# Patient Record
Sex: Female | Born: 1940
Health system: Southern US, Community
[De-identification: ages and names within clinical notes are randomized; demographics above are authoritative.]

## PROBLEM LIST (undated history)

## (undated) DIAGNOSIS — R112 Nausea with vomiting, unspecified: Secondary | ICD-10-CM

## (undated) DIAGNOSIS — T4145XA Adverse effect of unspecified anesthetic, initial encounter: Secondary | ICD-10-CM

## (undated) DIAGNOSIS — Z9889 Other specified postprocedural states: Secondary | ICD-10-CM

## (undated) DIAGNOSIS — M1990 Unspecified osteoarthritis, unspecified site: Secondary | ICD-10-CM

## (undated) DIAGNOSIS — G43909 Migraine, unspecified, not intractable, without status migrainosus: Secondary | ICD-10-CM

## (undated) DIAGNOSIS — T8859XA Other complications of anesthesia, initial encounter: Secondary | ICD-10-CM

## (undated) DIAGNOSIS — IMO0001 Reserved for inherently not codable concepts without codable children: Secondary | ICD-10-CM

## (undated) DIAGNOSIS — I499 Cardiac arrhythmia, unspecified: Secondary | ICD-10-CM

## (undated) DIAGNOSIS — I10 Essential (primary) hypertension: Secondary | ICD-10-CM

## (undated) HISTORY — PX: CHOLECYSTECTOMY: SHX55

## (undated) HISTORY — PX: COLONOSCOPY: SHX174

## (undated) HISTORY — PX: ABDOMINAL HYSTERECTOMY: SHX81

## (undated) HISTORY — PX: ROTATOR CUFF REPAIR: SHX139

## (undated) HISTORY — PX: CARDIAC SURGERY: SHX584

## (undated) HISTORY — DX: Essential (primary) hypertension: I10

## (undated) HISTORY — PX: ANTERIOR AND POSTERIOR REPAIR: SHX1172

## (undated) HISTORY — PX: ESOPHAGEAL DILATION: SHX303

## (undated) HISTORY — PX: HERNIA REPAIR: SHX51

---

## 1998-02-01 ENCOUNTER — Encounter: Payer: Self-pay | Admitting: Emergency Medicine

## 1998-02-01 ENCOUNTER — Emergency Department (HOSPITAL_COMMUNITY): Admission: EM | Admit: 1998-02-01 | Discharge: 1998-02-01 | Payer: Self-pay

## 1998-02-06 ENCOUNTER — Ambulatory Visit (HOSPITAL_COMMUNITY): Admission: RE | Admit: 1998-02-06 | Discharge: 1998-02-06 | Payer: Self-pay | Admitting: Specialist

## 1998-02-06 ENCOUNTER — Encounter: Payer: Self-pay | Admitting: Specialist

## 1998-11-26 ENCOUNTER — Other Ambulatory Visit: Admission: RE | Admit: 1998-11-26 | Discharge: 1998-11-26 | Payer: Self-pay | Admitting: Endocrinology

## 1999-05-25 ENCOUNTER — Encounter: Payer: Self-pay | Admitting: Endocrinology

## 1999-05-25 ENCOUNTER — Ambulatory Visit (HOSPITAL_COMMUNITY): Admission: RE | Admit: 1999-05-25 | Discharge: 1999-05-25 | Payer: Self-pay | Admitting: Endocrinology

## 2000-10-26 ENCOUNTER — Ambulatory Visit (HOSPITAL_COMMUNITY): Admission: RE | Admit: 2000-10-26 | Discharge: 2000-10-26 | Payer: Self-pay | Admitting: Endocrinology

## 2000-10-26 ENCOUNTER — Encounter: Payer: Self-pay | Admitting: Endocrinology

## 2004-01-14 ENCOUNTER — Ambulatory Visit: Payer: Self-pay | Admitting: Internal Medicine

## 2004-08-21 ENCOUNTER — Emergency Department: Payer: Self-pay | Admitting: Emergency Medicine

## 2004-08-21 ENCOUNTER — Other Ambulatory Visit: Payer: Self-pay

## 2004-09-01 ENCOUNTER — Observation Stay (HOSPITAL_COMMUNITY): Admission: RE | Admit: 2004-09-01 | Discharge: 2004-09-02 | Payer: Self-pay | Admitting: Obstetrics and Gynecology

## 2005-03-09 ENCOUNTER — Encounter: Admission: RE | Admit: 2005-03-09 | Discharge: 2005-03-09 | Payer: Self-pay | Admitting: Internal Medicine

## 2005-03-29 ENCOUNTER — Ambulatory Visit (HOSPITAL_COMMUNITY): Admission: RE | Admit: 2005-03-29 | Discharge: 2005-03-29 | Payer: Self-pay | Admitting: Surgery

## 2005-03-30 ENCOUNTER — Encounter (INDEPENDENT_AMBULATORY_CARE_PROVIDER_SITE_OTHER): Payer: Self-pay | Admitting: *Deleted

## 2005-03-30 ENCOUNTER — Ambulatory Visit (HOSPITAL_COMMUNITY): Admission: RE | Admit: 2005-03-30 | Discharge: 2005-03-30 | Payer: Self-pay | Admitting: Surgery

## 2007-09-07 ENCOUNTER — Ambulatory Visit: Payer: Self-pay | Admitting: Internal Medicine

## 2007-09-21 ENCOUNTER — Ambulatory Visit: Payer: Self-pay | Admitting: Internal Medicine

## 2007-09-21 ENCOUNTER — Encounter: Payer: Self-pay | Admitting: Internal Medicine

## 2007-09-22 ENCOUNTER — Encounter: Payer: Self-pay | Admitting: Internal Medicine

## 2009-07-18 ENCOUNTER — Ambulatory Visit: Payer: Self-pay | Admitting: Specialist

## 2010-07-03 NOTE — Op Note (Signed)
NAMEMALESSA, ZARTMAN              ACCOUNT NO.:  [id number]   MEDICAL RECORD NO.:  [id number]          PATIENT TYPE:  OIB   LOCATION:  1009                         FACILITY:  [facility]   PHYSICIAN:  Thornton Park. Daphine Deutscher, MD  DATE OF BIRTH:  [birth year]   DATE OF PROCEDURE:  03/30/2005  DATE OF DISCHARGE:  03/30/2005                                 OPERATIVE REPORT   CCS NUMBER:  [id number].   PREOPERATIVE DIAGNOSIS:  Gallstones with chronic cholecystitis.   POSTOPERATIVE DIAGNOSIS:  Gallstones with chronic cholecystitis.   PROCEDURE:  Laparoscopic cholecystectomy with intraoperative cholangiogram.   SURGEON:  Thornton Park. Daphine Deutscher, MD.   ASSISTANT:  Ginette Pitman, MD.   ANESTHESIA:  General endotracheal.   DRAINS:  None.   FINDINGS:  Chronic cholecystitis with normal IOC.   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 Informed  consent was obtained regarding lap and open cholecystectomy. She was taken  to room 1 on March 30, 2005 and given general anesthesia. The abdomen was  prepped with chlorhexidine and draped sterilely. A longitudinal incision was  made down to the umbilicus where there was a small umbilical hernia. I used  that to gain access to the abdomen without difficulty and inserted the  [facility] cannula. The abdomen was insufflated. All port sites were injected  with Marcaine and three other trocars were placed. The gallbladder was  grasped and elevated. She had numerous adhesions to the infundibulum and  neck and these were stripped away with blunt dissection. No bleeding  occurred that was appreciable. I then dissected out Calot's triangle. I put  a clip upon the gallbladder and incised the cystic duct. I inserted the  Reddick catheter holding it in place with the clip and then took a dynamic  cholangiogram which showed prompt intrahepatic filling and a normal size  common duct with no  filling defects and free flow into the duodenum. The  cystic duct was then triple clipped, divided. The cystic artery was triple  clipped and divided and the gallbladder was removed from the gallbladder bed  with hook electrocautery without entering it. It was placed in a bag and  brought out through the umbilicus. The umbilical defect was repaired with  two simple sutures of #0 Vicryl and tied down. This was done under  laparoscopic  vision. The area was then irrigated. No bleeding or bile leaks were noted.  The trocars were removed after the gas was removed. The wounds were closed  with 4-0 Vicryl with Benzoin and Steri-Strips. The patient seemed to  tolerate the procedure well. She will be evaluated for possible discharge  today and will be given Tylox for pain.      Thornton Park Daphine Deutscher, MD  Electronically Signed     MBM/MEDQ  D:  03/30/2005  T:  03/30/2005  Job:  [id number]   cc:   Juline Patch, M.D.  Fax: [phone]

## 2010-07-03 NOTE — Op Note (Signed)
Marissa Ponce, Marissa Ponce              ACCOUNT NO.:  [id number]   MEDICAL RECORD NO.:  [id number]          PATIENT TYPE:  OBV   LOCATION:  9399                          FACILITY:  [facility]   PHYSICIAN:  Zenaida Niece, M.D.DATE OF BIRTH:  Jun 15, 1940   DATE OF PROCEDURE:  09/01/2004  DATE OF DISCHARGE:                                 OPERATIVE REPORT   PREOPERATIVE DIAGNOSIS:  Cystocele and rectocele.   POSTOPERATIVE DIAGNOSIS:  Cystocele and rectocele.   PROCEDURES:  Anterior and posterior colporrhaphies.   SURGEON:  Zenaida Niece, M.D.   ASSISTANT:  Huel Cote, M.D.   ANESTHESIA:  General with an LMA.   SPECIMENS:  None.   ESTIMATED BLOOD LOSS:  100 mL.   COMPLICATIONS:  None.   FINDINGS:  Grade 1 cystocele and grade II to III rectocele.   PROCEDURE IN DETAIL:  The patient was taken to the operating room and placed  in the dorsosupine position. General anesthesia was induced and she was  placed in mobile stirrups. Perineum and vagina were then prepped and draped  in the usual sterile fashion and bladder drained with a red rubber catheter.  A weighted speculum was inserted into the vagina and the vaginal cuff was  grasped with Allis clamps. A horizontal piece of tissue along the anterior  vaginal cuff was removed sharply. The vagina was then dissected from the  vaginal cuff to within 2 cm of the urethral meatus in the midline. The  cystocele was then mobilized laterally with sharp and blunt dissection.  Bleeding was controlled with electrocautery and one suture of 3-0 Vicryl.  The cystocele was then reduced with interrupted sutures of 2-0 Vicryl.  Excess vaginal mucosa was trimmed and the vaginal incision was closed with  running locking 2-0 Vicryl from the urethral meatus to the vaginal cuff.  This achieved good closure and hemostasis. Attention was then turned  posteriorly. Allis clamps were used to grasp the hymenal ring at a distance  that when brought together  would still allow two fingers to easily pass in  the vagina. A horizontal piece tissue was removed sharply. The vagina was  dissected in the midline from the hymenal ring to the vaginal apex sharply.  The rectocele was then mobilized laterally with sharp and blunt dissection.  Finger was placed in the rectum and confirmed no rectal injury. The  rectocele was then reduced with interrupted sutures of 2-0 Vicryl. A finger  was again placed in the rectum and again confirmed no rectal injury with no  sutures in the rectum. Excess vaginal mucosa was then removed and vagina was  closed in a running locking fashion from the vaginal cuff to the hymenal  ring with 2-0 Vicryl. This achieved adequate closure and hemostasis. One  interrupted suture of 2-0 Vicryl was used to reapproximate the  bulbocavernosus muscles. The vagina was inspected and found to have adequate  hemostasis and closure. The vagina was then packed with 2-inch gauze with  Estrace cream. A Foley catheter was placed. The patient was taken down  from stirrups. She was extubated in the operating room and  taken to recovery  room in stable condition after tolerating the procedure well. Counts were  correct x2, she received Ancef 1 gram prior to the procedure, she had PAS  hose on throughout the procedure.       TDM/MEDQ  D:  09/01/2004  T:  09/01/2004  Job:  [id number]

## 2010-07-03 NOTE — H&P (Signed)
Marissa Ponce, Marissa Ponce              ACCOUNT NO.:  [id number]   MEDICAL RECORD NO.:  [id number]          PATIENT TYPE:  AMB   LOCATION:  SDC                           FACILITY:  [facility]   PHYSICIAN:  Zenaida Niece, M.D.DATE OF BIRTH:  11-08-1940   DATE OF ADMISSION:  09/01/2004  DATE OF DISCHARGE:                                HISTORY & PHYSICAL   CHIEF COMPLAINT:  Symptomatic cystocele and rectocele.   HISTORY OF PRESENT ILLNESS:  This is a 70 year old female, para 3-0-0-3,  whom I saw for an annual exam in May of 2006.  At that time, she was having  no bleeding, as she has had a previous hysterectomy.  She is experiencing  increasing pelvic pressure from a known cystocele and rectocele.  She is  sexually active, and this is interfering with sexual activity.  She also  complains of incomplete bowel movements.  On pelvic exam, she has a grade 2  rectocele and a grade 1 cystocele.  All options were discussed with the  patient, and she wishes to proceed with surgical therapy.   PAST OBSTETRIC HISTORY:  Three vaginal deliveries at term without  complications.   PAST MEDICAL HISTORY:  Negative.   PAST SURGICAL HISTORY:  1.  Vaginal hysterectomy, possibly with A&P repair in 1978.  2.  Left inguinal hernia in 1970.   ALLERGIES:  She is allergic to SULFA drugs.   CURRENT MEDICATIONS:  Vitamins.   SOCIAL HISTORY:  She is married and denies alcohol, tobacco, or drug use.  She is a Engineer, civil (consulting) in the PACU.   FAMILY HISTORY:  She has an older sister with breast and uterine cancer.   REVIEW OF SYSTEMS:  Normal urinary function and no other significant  complaints.   PHYSICAL EXAMINATION:  VITAL SIGNS:  Weight is 200 pounds.  Blood pressure  122/86, pulse 80.  GENERAL:  This is a well-developed female in no acute distress.  NECK:  Supple without lymphadenopathy or thyromegaly.  LUNGS:  Clear to auscultation.  HEART:  Regular rate and rhythm without murmur.  ABDOMEN:  Soft, nontender,  nondistended, without palpable masses.  EXTREMITIES:  No edema and are nontender.  PELVIC:  External genitalia has no lesions.  On speculum exam, the vaginal  cuff is well healed, and she does have a grade 2 rectocele and a grade 1  cystocele.  On bimanual and rectovaginal exams, there are no palpable  masses.   ASSESSMENT:  Symptomatic cystocele and rectocele.  All non-surgical and  surgical options have been discussed with the patient.  The patient wishes  to proceed with surgical therapy.  Risks of surgery including bleeding,  infection, and damage to bowels, bladder, and ureters have been discussed,  and she understands these risks.   PLAN:  The plan is to admit the patient for anterior and posterior  colporrhaphy.  She has been treated with Diflucan 150 mg preoperatively due  to yeast noted on a preoperative urinalysis.       TDM/MEDQ  D:  08/31/2004  T:  08/31/2004  Job:  [id number]

## 2013-10-15 ENCOUNTER — Encounter: Payer: Self-pay | Admitting: Internal Medicine

## 2014-03-15 ENCOUNTER — Encounter: Payer: Self-pay | Admitting: Internal Medicine

## 2014-04-17 ENCOUNTER — Encounter: Payer: Self-pay | Admitting: Internal Medicine

## 2014-04-24 ENCOUNTER — Encounter: Payer: Self-pay | Admitting: Internal Medicine

## 2014-06-28 ENCOUNTER — Encounter: Payer: Self-pay | Admitting: Family Medicine

## 2014-07-22 ENCOUNTER — Encounter: Payer: Self-pay | Admitting: Internal Medicine

## 2015-01-23 DIAGNOSIS — M1712 Unilateral primary osteoarthritis, left knee: Secondary | ICD-10-CM | POA: Diagnosis not present

## 2015-01-23 DIAGNOSIS — M170 Bilateral primary osteoarthritis of knee: Secondary | ICD-10-CM | POA: Diagnosis not present

## 2015-01-23 DIAGNOSIS — M1711 Unilateral primary osteoarthritis, right knee: Secondary | ICD-10-CM | POA: Diagnosis not present

## 2015-03-13 DIAGNOSIS — Z79899 Other long term (current) drug therapy: Secondary | ICD-10-CM | POA: Diagnosis not present

## 2015-03-13 DIAGNOSIS — E785 Hyperlipidemia, unspecified: Secondary | ICD-10-CM | POA: Diagnosis not present

## 2015-03-17 DIAGNOSIS — Z1389 Encounter for screening for other disorder: Secondary | ICD-10-CM | POA: Diagnosis not present

## 2015-03-17 DIAGNOSIS — E7800 Pure hypercholesterolemia, unspecified: Secondary | ICD-10-CM | POA: Diagnosis not present

## 2015-03-17 DIAGNOSIS — H9311 Tinnitus, right ear: Secondary | ICD-10-CM | POA: Diagnosis not present

## 2015-03-17 DIAGNOSIS — Z0000 Encounter for general adult medical examination without abnormal findings: Secondary | ICD-10-CM | POA: Diagnosis not present

## 2015-03-17 DIAGNOSIS — Z9181 History of falling: Secondary | ICD-10-CM | POA: Diagnosis not present

## 2015-03-31 DIAGNOSIS — H6121 Impacted cerumen, right ear: Secondary | ICD-10-CM | POA: Diagnosis not present

## 2015-03-31 DIAGNOSIS — H900 Conductive hearing loss, bilateral: Secondary | ICD-10-CM | POA: Diagnosis not present

## 2015-03-31 DIAGNOSIS — H9319 Tinnitus, unspecified ear: Secondary | ICD-10-CM | POA: Diagnosis not present

## 2015-03-31 DIAGNOSIS — H9311 Tinnitus, right ear: Secondary | ICD-10-CM | POA: Diagnosis not present

## 2015-05-08 NOTE — H&P (Signed)
  Marissa Ponce is an 75 y.o. female.    Chief Complaint: left knee pain  HPI: Pt is a 75 y.o. female complaining of left knee pain for multiple years. Pain had continually increased since the beginning. X-rays in the clinic show end-stage arthritic changes of the left knee. Pt has tried various conservative treatments which have failed to alleviate their symptoms, including injections and therapy. Various options are discussed with the patient. Risks, benefits and expectations were discussed with the patient. Patient understand the risks, benefits and expectations and wishes to proceed with surgery.   PCP:  No primary care provider on file.  D/C Plans: Home  PMH: No past medical history on file.  PSH: No past surgical history on file.  Social History:  has no tobacco, alcohol, and drug history on file.  Allergies:  Allergies  Allergen Reactions  . Sulfonamide Derivatives     REACTION: Rash    Medications: No current facility-administered medications for this encounter.   No current outpatient prescriptions on file.    No results found for this or any previous visit (from the past 48 hour(s)). No results found.  ROS: Pain with rom of the left lower extremity  Physical Exam:  Alert and oriented 75 y.o. female in no acute distress Cranial nerves 2-12 intact Cervical spine: full rom with no tenderness, nv intact distally Chest: active breath sounds bilaterally, no wheeze rhonchi or rales Heart: regular rate and rhythm, no murmur Abd: non tender non distended with active bowel sounds Hip is stable with rom  Left knee medial and lateral joint line tenderness nv intact distally No rashes or edema Antalgic gait  Assessment/Plan Assessment: left knee end stage osteoarthritis  Plan: Patient will undergo a left total knee arthroplasty by Dr. Veverly Fells at [facility]. Risks benefits and expectations were discussed with the patient. Patient understand risks, benefits and  expectations and wishes to proceed.

## 2015-05-09 NOTE — Pre-Procedure Instructions (Addendum)
    Marissa Ponce  05/09/2015      Your procedure is scheduled on Friday, March 31.              After 8:00 AM om Friday, March 31 call OR Desk and ask what time you should arrive.  [phone]   Report to [facility] as instructed   Call this number if you have problems the morning of surgery: [phone]                 For any other questions, please call [phone], Monday - Friday 8 AM - 4 PM.   Remember:  Do not eat food or drink liquids after midnight Thursday, April 6.  Take these medicines the morning of surgery with A SIP OF WATER : May take Tramadol if needed.              Friday, March 31, STOP taking Naproxen, Aspirin and Vitamins.  Do not take any Aspirin products, Ibuprofen or Herbal Medications.   Do not wear jewelry, make-up or nail polish.  Do not wear lotions, powders, or perfumes.    Do not shave 48 hours prior to surgery.    Do not bring valuables to the hospital.  [facility] is not responsible for any belongings or valuables.  Contacts, dentures or bridgework may not be worn into surgery.  Leave your suitcase in the car.  After surgery it may be brought to your room.  For patients admitted to the hospital, discharge time will be determined by your treatment team.  Special instructions:  Review  [facility] - Preparing For Surgery.  Please read over the following fact sheets that you were given. Pain Booklet, Coughing and Deep Breathing, MRSA Information and Surgical Site Infection Prevention, Incentive Spirometery

## 2015-05-12 ENCOUNTER — Encounter (HOSPITAL_COMMUNITY)
Admission: RE | Admit: 2015-05-12 | Discharge: 2015-05-12 | Disposition: A | Discharge status: Home / Self Care | Payer: Medicare HMO | Source: Ambulatory Visit | Attending: Orthopedic Surgery | Admitting: Orthopedic Surgery

## 2015-05-12 ENCOUNTER — Encounter (HOSPITAL_COMMUNITY): Payer: Self-pay

## 2015-05-12 DIAGNOSIS — Z01818 Encounter for other preprocedural examination: Secondary | ICD-10-CM | POA: Diagnosis not present

## 2015-05-12 DIAGNOSIS — R9431 Abnormal electrocardiogram [ECG] [EKG]: Secondary | ICD-10-CM | POA: Insufficient documentation

## 2015-05-12 DIAGNOSIS — M1712 Unilateral primary osteoarthritis, left knee: Secondary | ICD-10-CM | POA: Diagnosis not present

## 2015-05-12 DIAGNOSIS — Z01812 Encounter for preprocedural laboratory examination: Secondary | ICD-10-CM | POA: Diagnosis not present

## 2015-05-12 HISTORY — DX: Nausea with vomiting, unspecified: R11.2

## 2015-05-12 HISTORY — DX: Unspecified osteoarthritis, unspecified site: M19.90

## 2015-05-12 HISTORY — DX: Other complications of anesthesia, initial encounter: T88.59XA

## 2015-05-12 HISTORY — DX: Other specified postprocedural states: Z98.890

## 2015-05-12 HISTORY — DX: Adverse effect of unspecified anesthetic, initial encounter: T41.45XA

## 2015-05-12 HISTORY — DX: Migraine, unspecified, not intractable, without status migrainosus: G43.909

## 2015-05-12 HISTORY — DX: Cardiac arrhythmia, unspecified: I49.9

## 2015-05-12 HISTORY — DX: Reserved for inherently not codable concepts without codable children: IMO0001

## 2015-05-12 LAB — CBC
HEMATOCRIT: 39.2 % (ref 36.0–46.0)
HEMOGLOBIN: 12.6 g/dL (ref 12.0–15.0)
MCH: 27.8 pg (ref 26.0–34.0)
MCHC: 32.1 g/dL (ref 30.0–36.0)
MCV: 86.3 fL (ref 78.0–100.0)
Platelets: 249 10*3/uL (ref 150–400)
RBC: 4.54 MIL/uL (ref 3.87–5.11)
RDW: 14.2 % (ref 11.5–15.5)
WBC: 8 10*3/uL (ref 4.0–10.5)

## 2015-05-12 LAB — BASIC METABOLIC PANEL
ANION GAP: 10 (ref 5–15)
BUN: 10 mg/dL (ref 6–20)
CO2: 22 mmol/L (ref 22–32)
Calcium: 9.2 mg/dL (ref 8.9–10.3)
Chloride: 106 mmol/L (ref 101–111)
Creatinine, Ser: 0.7 mg/dL (ref 0.44–1.00)
GFR calc Af Amer: >60 mL/min (ref >60)
GFR calc non Af Amer: >60 mL/min (ref >60)
GLUCOSE: 99 mg/dL (ref 65–99)
POTASSIUM: 4.2 mmol/L (ref 3.5–5.1)
Sodium: 138 mmol/L (ref 135–145)

## 2015-05-12 LAB — SURGICAL PCR SCREEN
MRSA, PCR: NEGATIVE
STAPHYLOCOCCUS AUREUS: POSITIVE — AB

## 2015-05-12 NOTE — Progress Notes (Signed)
Marissa Ponce denies chest pain  or shortness of breath.   "I do have an irregular heart beatt at times, "PVC's" due to caffeine intack. Patient does not see a cardiologist and has not had an EKG done in years , if ever.  Denies ever having an echo or stress test.  Last time patient knew her heart beat was irregular was a few days ago, "I had eaten a couple oreos and drank a coke."  Patient denies experiencing shortness of breath or lightheadness or n/v when heart rate is irregular.

## 2015-05-16 ENCOUNTER — Inpatient Hospital Stay (HOSPITAL_COMMUNITY): Payer: Medicare HMO | Admitting: Emergency Medicine

## 2015-05-16 ENCOUNTER — Inpatient Hospital Stay (HOSPITAL_COMMUNITY): Payer: Medicare HMO | Admitting: Anesthesiology

## 2015-05-16 ENCOUNTER — Inpatient Hospital Stay (HOSPITAL_COMMUNITY)
Admission: RE | Admit: 2015-05-16 | Discharge: 2015-05-19 | DRG: 470 | Disposition: A | Discharge status: Home Health | Payer: Medicare HMO | Source: Ambulatory Visit | Attending: Orthopedic Surgery | Admitting: Orthopedic Surgery

## 2015-05-16 ENCOUNTER — Inpatient Hospital Stay (HOSPITAL_COMMUNITY): Payer: Medicare HMO

## 2015-05-16 ENCOUNTER — Encounter (HOSPITAL_COMMUNITY)
Admission: RE | Disposition: A | Discharge status: Home Health | Payer: Self-pay | Source: Ambulatory Visit | Attending: Orthopedic Surgery

## 2015-05-16 DIAGNOSIS — Z96652 Presence of left artificial knee joint: Secondary | ICD-10-CM | POA: Diagnosis not present

## 2015-05-16 DIAGNOSIS — Z96659 Presence of unspecified artificial knee joint: Secondary | ICD-10-CM

## 2015-05-16 DIAGNOSIS — M1712 Unilateral primary osteoarthritis, left knee: Secondary | ICD-10-CM | POA: Diagnosis not present

## 2015-05-16 DIAGNOSIS — Z882 Allergy status to sulfonamides status: Secondary | ICD-10-CM | POA: Diagnosis not present

## 2015-05-16 DIAGNOSIS — G8918 Other acute postprocedural pain: Secondary | ICD-10-CM | POA: Diagnosis not present

## 2015-05-16 DIAGNOSIS — M25562 Pain in left knee: Secondary | ICD-10-CM | POA: Diagnosis present

## 2015-05-16 DIAGNOSIS — M179 Osteoarthritis of knee, unspecified: Secondary | ICD-10-CM | POA: Diagnosis not present

## 2015-05-16 DIAGNOSIS — Z471 Aftercare following joint replacement surgery: Secondary | ICD-10-CM | POA: Diagnosis not present

## 2015-05-16 HISTORY — PX: TOTAL KNEE ARTHROPLASTY: SHX125

## 2015-05-16 HISTORY — DX: Presence of unspecified artificial knee joint: Z96.659

## 2015-05-16 LAB — CBC
HEMATOCRIT: 38.8 % (ref 36.0–46.0)
Hemoglobin: 12.5 g/dL (ref 12.0–15.0)
MCH: 28 pg (ref 26.0–34.0)
MCHC: 32.2 g/dL (ref 30.0–36.0)
MCV: 87 fL (ref 78.0–100.0)
PLATELETS: 244 10*3/uL (ref 150–400)
RBC: 4.46 MIL/uL (ref 3.87–5.11)
RDW: 14.3 % (ref 11.5–15.5)
WBC: 11.8 10*3/uL — AB (ref 4.0–10.5)

## 2015-05-16 LAB — CREATININE, SERUM: CREATININE: 0.67 mg/dL (ref 0.44–1.00)

## 2015-05-16 SURGERY — ARTHROPLASTY, KNEE, TOTAL
Anesthesia: Regional | Site: Knee | Laterality: Left

## 2015-05-16 MED ORDER — HYDROCODONE-ACETAMINOPHEN 5-325 MG PO TABS: 1.0000 | ORAL_TABLET | Freq: Four times a day (QID) | ORAL | Status: DC | PRN

## 2015-05-16 MED ORDER — PROPOFOL 10 MG/ML IV BOLUS
INTRAVENOUS | Status: DC | PRN
  Administered 2015-05-16: 20 mg via INTRAVENOUS

## 2015-05-16 MED ORDER — TRANEXAMIC ACID 1000 MG/10ML IV SOLN
1000.0000 mg | INTRAVENOUS | Status: AC
  Administered 2015-05-16: 1000 mg via INTRAVENOUS
  Filled 2015-05-16: qty 10

## 2015-05-16 MED ORDER — COUMADIN BOOK
Freq: Once | Status: AC
  Administered 2015-05-16: 23:00:00
  Filled 2015-05-16: qty 1

## 2015-05-16 MED ORDER — LIDOCAINE HCL (CARDIAC) 20 MG/ML IV SOLN
INTRAVENOUS | Status: DC | PRN
  Administered 2015-05-16: 60 mg via INTRAVENOUS

## 2015-05-16 MED ORDER — BISACODYL 10 MG RE SUPP: 10.0000 mg | Freq: Every day | RECTAL | Status: DC | PRN

## 2015-05-16 MED ORDER — HYDROMORPHONE HCL 1 MG/ML IJ SOLN
0.5000 mg | INTRAMUSCULAR | Status: DC | PRN
  Administered 2015-05-16 – 2015-05-17 (×6): 0.5 mg via INTRAVENOUS
  Filled 2015-05-16 (×6): qty 1

## 2015-05-16 MED ORDER — METOCLOPRAMIDE HCL 5 MG PO TABS
5.0000 mg | ORAL_TABLET | Freq: Three times a day (TID) | ORAL | Status: DC | PRN
  Administered 2015-05-17: 10 mg via ORAL
  Filled 2015-05-16: qty 2

## 2015-05-16 MED ORDER — METHOCARBAMOL 500 MG PO TABS
ORAL_TABLET | ORAL | Status: AC
  Filled 2015-05-16: qty 1

## 2015-05-16 MED ORDER — WARFARIN - PHARMACIST DOSING INPATIENT
Freq: Every day | Status: DC
  Administered 2015-05-17: 18:00:00

## 2015-05-16 MED ORDER — BUPIVACAINE-EPINEPHRINE (PF) 0.5% -1:200000 IJ SOLN
INTRAMUSCULAR | Status: DC | PRN
  Administered 2015-05-16: 20 mL via PERINEURAL

## 2015-05-16 MED ORDER — CEFAZOLIN SODIUM-DEXTROSE 2-4 GM/100ML-% IV SOLN
INTRAVENOUS | Status: AC
  Filled 2015-05-16: qty 100

## 2015-05-16 MED ORDER — LACTATED RINGERS IV SOLN
INTRAVENOUS | Status: DC
  Administered 2015-05-16: 13:00:00 via INTRAVENOUS

## 2015-05-16 MED ORDER — ACETAMINOPHEN 650 MG RE SUPP: 650.0000 mg | Freq: Four times a day (QID) | RECTAL | Status: DC | PRN

## 2015-05-16 MED ORDER — MENTHOL 3 MG MT LOZG: 1.0000 | LOZENGE | OROMUCOSAL | Status: DC | PRN

## 2015-05-16 MED ORDER — DEXTROSE 5 % IV SOLN
500.0000 mg | Freq: Four times a day (QID) | INTRAVENOUS | Status: DC | PRN
  Filled 2015-05-16: qty 5

## 2015-05-16 MED ORDER — FENTANYL CITRATE (PF) 100 MCG/2ML IJ SOLN
INTRAMUSCULAR | Status: DC | PRN
  Administered 2015-05-16 (×3): 50 ug via INTRAVENOUS

## 2015-05-16 MED ORDER — BUPIVACAINE IN DEXTROSE 0.75-8.25 % IT SOLN
INTRATHECAL | Status: DC | PRN
  Administered 2015-05-16: 1.8 mL via INTRATHECAL

## 2015-05-16 MED ORDER — ONDANSETRON HCL 4 MG/2ML IJ SOLN
4.0000 mg | Freq: Four times a day (QID) | INTRAMUSCULAR | Status: DC | PRN
  Administered 2015-05-16 – 2015-05-18 (×3): 4 mg via INTRAVENOUS
  Filled 2015-05-16 (×3): qty 2

## 2015-05-16 MED ORDER — WARFARIN SODIUM 5 MG PO TABS
5.0000 mg | ORAL_TABLET | ORAL | Status: AC
  Administered 2015-05-16: 5 mg via ORAL
  Filled 2015-05-16: qty 1

## 2015-05-16 MED ORDER — MIDAZOLAM HCL 2 MG/2ML IJ SOLN
INTRAMUSCULAR | Status: AC
  Filled 2015-05-16: qty 2

## 2015-05-16 MED ORDER — CHLORHEXIDINE GLUCONATE 4 % EX LIQD: 60.0000 mL | Freq: Once | CUTANEOUS | Status: DC

## 2015-05-16 MED ORDER — METOCLOPRAMIDE HCL 5 MG/ML IJ SOLN: 5.0000 mg | Freq: Three times a day (TID) | INTRAMUSCULAR | Status: DC | PRN

## 2015-05-16 MED ORDER — TRANEXAMIC ACID 1000 MG/10ML IV SOLN
2000.0000 mg | INTRAVENOUS | Status: AC
  Administered 2015-05-16: 2000 mg via TOPICAL
  Filled 2015-05-16: qty 20

## 2015-05-16 MED ORDER — HYDROCODONE-ACETAMINOPHEN 7.5-325 MG PO TABS
1.0000 | ORAL_TABLET | Freq: Once | ORAL | Status: DC | PRN
Start: 2015-05-16 — End: 2015-05-16

## 2015-05-16 MED ORDER — SODIUM CHLORIDE 0.9 % IV SOLN: INTRAVENOUS | Status: DC

## 2015-05-16 MED ORDER — FENTANYL CITRATE (PF) 250 MCG/5ML IJ SOLN
INTRAMUSCULAR | Status: AC
  Filled 2015-05-16: qty 5

## 2015-05-16 MED ORDER — POLYETHYLENE GLYCOL 3350 17 G PO PACK: 17.0000 g | PACK | Freq: Every day | ORAL | Status: DC | PRN

## 2015-05-16 MED ORDER — WARFARIN SODIUM 5 MG PO TABS: 5.0000 mg | ORAL_TABLET | Freq: Every day | ORAL | Status: DC

## 2015-05-16 MED ORDER — HYDROMORPHONE HCL 1 MG/ML IJ SOLN
0.2500 mg | INTRAMUSCULAR | Status: DC | PRN
  Administered 2015-05-16 (×4): 0.5 mg via INTRAVENOUS

## 2015-05-16 MED ORDER — ONDANSETRON HCL 4 MG PO TABS
4.0000 mg | ORAL_TABLET | Freq: Four times a day (QID) | ORAL | Status: DC | PRN
  Administered 2015-05-17 – 2015-05-19 (×3): 4 mg via ORAL
  Filled 2015-05-16 (×3): qty 1

## 2015-05-16 MED ORDER — PROPOFOL 500 MG/50ML IV EMUL
INTRAVENOUS | Status: DC | PRN
  Administered 2015-05-16: 75 ug/kg/min via INTRAVENOUS

## 2015-05-16 MED ORDER — CEFAZOLIN SODIUM-DEXTROSE 2-4 GM/100ML-% IV SOLN
2.0000 g | Freq: Four times a day (QID) | INTRAVENOUS | Status: AC
  Administered 2015-05-16 – 2015-05-17 (×2): 2 g via INTRAVENOUS
  Filled 2015-05-16 (×2): qty 100

## 2015-05-16 MED ORDER — FENTANYL CITRATE (PF) 100 MCG/2ML IJ SOLN
INTRAMUSCULAR | Status: AC
  Filled 2015-05-16: qty 2

## 2015-05-16 MED ORDER — TRAMADOL HCL 50 MG PO TABS
50.0000 mg | ORAL_TABLET | Freq: Four times a day (QID) | ORAL | Status: DC | PRN
  Administered 2015-05-17 – 2015-05-18 (×4): 50 mg via ORAL
  Filled 2015-05-16 (×4): qty 1

## 2015-05-16 MED ORDER — HYDROMORPHONE HCL 1 MG/ML IJ SOLN
INTRAMUSCULAR | Status: AC
  Filled 2015-05-16: qty 1

## 2015-05-16 MED ORDER — METHOCARBAMOL 500 MG PO TABS: 500.0000 mg | ORAL_TABLET | Freq: Three times a day (TID) | ORAL | Status: DC | PRN

## 2015-05-16 MED ORDER — ASPIRIN EC 81 MG PO TBEC
81.0000 mg | DELAYED_RELEASE_TABLET | ORAL | Status: DC
  Administered 2015-05-17 – 2015-05-19 (×2): 81 mg via ORAL
  Filled 2015-05-16 (×2): qty 1

## 2015-05-16 MED ORDER — ENOXAPARIN SODIUM 30 MG/0.3ML ~~LOC~~ SOLN
30.0000 mg | Freq: Two times a day (BID) | SUBCUTANEOUS | Status: DC
  Administered 2015-05-17 – 2015-05-19 (×5): 30 mg via SUBCUTANEOUS
  Filled 2015-05-16 (×5): qty 0.3

## 2015-05-16 MED ORDER — CEFAZOLIN SODIUM-DEXTROSE 2-4 GM/100ML-% IV SOLN
2.0000 g | INTRAVENOUS | Status: AC
  Administered 2015-05-16: 2 g via INTRAVENOUS
  Filled 2015-05-16: qty 100

## 2015-05-16 MED ORDER — 0.9 % SODIUM CHLORIDE (POUR BTL) OPTIME
TOPICAL | Status: DC | PRN
  Administered 2015-05-16: 1000 mL

## 2015-05-16 MED ORDER — SODIUM CHLORIDE 0.9 % IR SOLN
Status: DC | PRN
  Administered 2015-05-16: 3000 mL

## 2015-05-16 MED ORDER — ACETAMINOPHEN 325 MG PO TABS: 650.0000 mg | ORAL_TABLET | Freq: Four times a day (QID) | ORAL | Status: DC | PRN

## 2015-05-16 MED ORDER — FERROUS SULFATE 325 (65 FE) MG PO TABS
325.0000 mg | ORAL_TABLET | Freq: Three times a day (TID) | ORAL | Status: DC
  Administered 2015-05-17 – 2015-05-19 (×3): 325 mg via ORAL
  Filled 2015-05-16 (×4): qty 1

## 2015-05-16 MED ORDER — B COMPLEX PO TABS: 1.0000 | ORAL_TABLET | Freq: Every day | ORAL | Status: DC

## 2015-05-16 MED ORDER — METHOCARBAMOL 500 MG PO TABS
500.0000 mg | ORAL_TABLET | Freq: Four times a day (QID) | ORAL | Status: DC | PRN
  Administered 2015-05-16 – 2015-05-18 (×5): 500 mg via ORAL
  Filled 2015-05-16 (×4): qty 1

## 2015-05-16 MED ORDER — PHENOL 1.4 % MT LIQD
1.0000 | OROMUCOSAL | Status: DC | PRN
Start: 2015-05-16 — End: 2015-05-19

## 2015-05-16 MED ORDER — HYDROCODONE-ACETAMINOPHEN 5-325 MG PO TABS
1.0000 | ORAL_TABLET | ORAL | Status: DC | PRN
  Administered 2015-05-16 – 2015-05-18 (×9): 2 via ORAL
  Administered 2015-05-18: 1 via ORAL
  Administered 2015-05-18 – 2015-05-19 (×4): 2 via ORAL
  Filled 2015-05-16 (×8): qty 2
  Filled 2015-05-16: qty 1
  Filled 2015-05-16 (×6): qty 2

## 2015-05-16 MED ORDER — DOCUSATE SODIUM 100 MG PO CAPS
100.0000 mg | ORAL_CAPSULE | Freq: Two times a day (BID) | ORAL | Status: DC
  Administered 2015-05-17 – 2015-05-18 (×3): 100 mg via ORAL
  Filled 2015-05-16 (×5): qty 1

## 2015-05-16 MED ORDER — LACTATED RINGERS IV SOLN
INTRAVENOUS | Status: DC | PRN
  Administered 2015-05-16 (×2): via INTRAVENOUS

## 2015-05-16 SURGICAL SUPPLY — 62 items
BAG DECANTER FOR FLEXI CONT (MISCELLANEOUS) ×2 IMPLANT
BANDAGE ESMARK 6X9 LF (GAUZE/BANDAGES/DRESSINGS) ×1 IMPLANT
BLADE SAG 18X100X1.27 (BLADE) ×1 IMPLANT
BLADE SAW SGTL 13.0X1.19X90.0M (BLADE) ×2 IMPLANT
BLADE SAW SGTL 13X75X1.27 (BLADE) ×2 IMPLANT
BNDG CMPR 9X6 STRL LF SNTH (GAUZE/BANDAGES/DRESSINGS) ×1
BNDG CMPR MED 10X6 ELC LF (GAUZE/BANDAGES/DRESSINGS) ×1
BNDG ELASTIC 6X10 VLCR STRL LF (GAUZE/BANDAGES/DRESSINGS) ×2 IMPLANT
BNDG ESMARK 6X9 LF (GAUZE/BANDAGES/DRESSINGS) ×2
BNDG GAUZE ELAST 4 BULKY (GAUZE/BANDAGES/DRESSINGS) ×4 IMPLANT
BOWL SMART MIX CTS (DISPOSABLE) ×2 IMPLANT
CAPT KNEE TOTAL 3 ATTUNE ×1 IMPLANT
CEMENT HV SMART SET (Cement) ×4 IMPLANT
COVER SURGICAL LIGHT HANDLE (MISCELLANEOUS) ×2 IMPLANT
CUFF TOURNIQUET SINGLE 34IN LL (TOURNIQUET CUFF) IMPLANT
CUFF TOURNIQUET SINGLE 44IN (TOURNIQUET CUFF) IMPLANT
DRAPE EXTREMITY T 121X128X90 (DRAPE) ×2 IMPLANT
DRAPE IMP U-DRAPE 54X76 (DRAPES) ×2 IMPLANT
DRAPE PROXIMA HALF (DRAPES) ×2 IMPLANT
DRAPE U-SHAPE 47X51 STRL (DRAPES) ×2 IMPLANT
DRSG ADAPTIC 3X8 NADH LF (GAUZE/BANDAGES/DRESSINGS) ×2 IMPLANT
DRSG PAD ABDOMINAL 8X10 ST (GAUZE/BANDAGES/DRESSINGS) ×4 IMPLANT
DURAPREP 26ML APPLICATOR (WOUND CARE) ×2 IMPLANT
ELECT CAUTERY BLADE 6.4 (BLADE) ×2 IMPLANT
ELECT REM PT RETURN 9FT ADLT (ELECTROSURGICAL) ×2
ELECTRODE REM PT RTRN 9FT ADLT (ELECTROSURGICAL) ×1 IMPLANT
GAUZE SPONGE 4X4 12PLY STRL (GAUZE/BANDAGES/DRESSINGS) ×2 IMPLANT
GLOVE BIOGEL PI ORTHO PRO 7.5 (GLOVE) ×1
GLOVE BIOGEL PI ORTHO PRO SZ8 (GLOVE) ×1
GLOVE ORTHO TXT STRL SZ7.5 (GLOVE) ×2 IMPLANT
GLOVE PI ORTHO PRO STRL 7.5 (GLOVE) ×1 IMPLANT
GLOVE PI ORTHO PRO STRL SZ8 (GLOVE) ×1 IMPLANT
GLOVE SURG ORTHO 8.5 STRL (GLOVE) ×2 IMPLANT
GOWN STRL REUS W/ TWL XL LVL3 (GOWN DISPOSABLE) ×3 IMPLANT
GOWN STRL REUS W/TWL XL LVL3 (GOWN DISPOSABLE) ×6
HANDPIECE INTERPULSE COAX TIP (DISPOSABLE) ×2
IMMOBILIZER KNEE 22 UNIV (SOFTGOODS) ×1 IMPLANT
KIT BASIN OR (CUSTOM PROCEDURE TRAY) ×2 IMPLANT
KIT MANIFOLD (MISCELLANEOUS) ×2 IMPLANT
KIT ROOM TURNOVER OR (KITS) ×2 IMPLANT
MANIFOLD NEPTUNE II (INSTRUMENTS) ×2 IMPLANT
NS IRRIG 1000ML POUR BTL (IV SOLUTION) ×2 IMPLANT
PACK TOTAL JOINT (CUSTOM PROCEDURE TRAY) ×2 IMPLANT
PACK UNIVERSAL I (CUSTOM PROCEDURE TRAY) ×2 IMPLANT
PAD ARMBOARD 7.5X6 YLW CONV (MISCELLANEOUS) ×4 IMPLANT
SET HNDPC FAN SPRY TIP SCT (DISPOSABLE) ×1 IMPLANT
STRIP CLOSURE SKIN 1/2X4 (GAUZE/BANDAGES/DRESSINGS) ×4 IMPLANT
SUCTION FRAZIER HANDLE 10FR (MISCELLANEOUS) ×1
SUCTION TUBE FRAZIER 10FR DISP (MISCELLANEOUS) ×1 IMPLANT
SUT MNCRL AB 3-0 PS2 18 (SUTURE) ×2 IMPLANT
SUT VIC AB 0 CT1 27 (SUTURE) ×4
SUT VIC AB 0 CT1 27XBRD ANBCTR (SUTURE) ×2 IMPLANT
SUT VIC AB 1 CT1 27 (SUTURE) ×6
SUT VIC AB 1 CT1 27XBRD ANBCTR (SUTURE) ×3 IMPLANT
SUT VIC AB 2-0 CT1 27 (SUTURE) ×4
SUT VIC AB 2-0 CT1 TAPERPNT 27 (SUTURE) ×2 IMPLANT
TOWEL OR 17X24 6PK STRL BLUE (TOWEL DISPOSABLE) ×2 IMPLANT
TOWEL OR 17X26 10 PK STRL BLUE (TOWEL DISPOSABLE) ×2 IMPLANT
TRAY FOLEY CATH 16FRSI W/METER (SET/KITS/TRAYS/PACK) IMPLANT
TUBE CONNECTING 12X1/4 (SUCTIONS) ×1 IMPLANT
WATER STERILE IRR 1000ML POUR (IV SOLUTION) IMPLANT
YANKAUER SUCT BULB TIP NO VENT (SUCTIONS) ×1 IMPLANT

## 2015-05-16 NOTE — Progress Notes (Signed)
Orthopedic Tech Progress Note Patient Details:  Marissa Ponce Mar 01, 1940 [id number] Applied CPM to LLE.  Applied OHF with trapeze to pt.'s bed. CPM Left Knee CPM Left Knee: On Left Knee Flexion (Degrees): 90 Left Knee Extension (Degrees): 0   Darrol Poke 05/16/2015, 5:47 PM

## 2015-05-16 NOTE — Brief Op Note (Signed)
05/16/2015  4:46 PM  PATIENT:  Marissa Ponce January  75 y.o. female  PRE-OPERATIVE DIAGNOSIS:  left knee end stage osteoarthritis  POST-OPERATIVE DIAGNOSIS:  left knee end stage osteoarthritis  PROCEDURE:  Procedure(s): LEFT TOTAL KNEE ARTHROPLASTY (Left)  DePuy Attune SURGEON:  Surgeon(s) and Role:    * Netta Cedars, MD - Primary  PHYSICIAN ASSISTANT:   ASSISTANTS: Ventura Bruns, PA-C   ANESTHESIA:   regional and spinal  EBL:  Total I/O In: 1000 [I.V.:1000] Out: 750 [Urine:650; Blood:100]  BLOOD ADMINISTERED:none  DRAINS: none   LOCAL MEDICATIONS USED:  NONE  SPECIMEN:  No Specimen  DISPOSITION OF SPECIMEN:  N/A  COUNTS:  YES  TOURNIQUET:   Total Tourniquet Time Documented: Thigh (Left) - 99 minutes Total: Thigh (Left) - 99 minutes   DICTATION: .Other Dictation: Dictation Number [id number]  PLAN OF CARE: Admit to inpatient   PATIENT DISPOSITION:  PACU - hemodynamically stable.   Delay start of Pharmacological VTE agent (>24hrs) due to surgical blood loss or risk of bleeding: no

## 2015-05-16 NOTE — Progress Notes (Addendum)
ANTICOAGULATION CONSULT NOTE - Initial Consult  Pharmacy Consult:  Coumadin Indication:  VTE prophylaxis  Allergies  Allergen Reactions  . Percocet [Oxycodone-Acetaminophen] Nausea And Vomiting  . Prednisone Other (See Comments)    wheeze  . Sulfonamide Derivatives Rash    Patient Measurements: Weight: 196 lb 6.4 oz (89.086 kg)  Vital Signs: Temp: 97.9 F (36.6 C) (03/31 1945) Temp Source: Oral (03/31 1945) BP: 137/58 mmHg (03/31 1945) Pulse Rate: 79 (03/31 1945)  Labs: No results for input(s): HGB, HCT, PLT, APTT, LABPROT, INR, HEPARINUNFRC, HEPRLOWMOCWT, CREATININE, CKTOTAL, CKMB, TROPONINI in the last 72 hours.  Estimated Creatinine Clearance: 70.8 mL/min (by C-G formula based on Cr of 0.7).   Medical History: Past Medical History  Diagnosis Date  . Complication of anesthesia   . PONV (postoperative nausea and vomiting)   . Migraines      history of none recent  . Shortness of breath dyspnea     with exertion  . Arthritis   . Dysrhythmia     with caffine, "PVC's"       Assessment: Marissa Ponce to start Coumadin for VTE prophylaxis s/p left TKA.  No baseline INR; however, expect it to be at baseline since patient is not on a blood thinner PTA.  No bleeding reported.   Goal of Therapy:  INR 2-3    Plan:  - Coumadin 5mg  PO today - Lovenox 30mg  SQ Q12H per MD.  D/C when INR >/= 1.8. - Daily PT / INR - Coumadin book    Kedric Bumgarner D. Mina Marble, PharmD, BCPS Pager:  [phone] 05/16/2015, 8:01 PM

## 2015-05-16 NOTE — Interval H&P Note (Signed)
History and Physical Interval Note:  05/16/2015 2:14 PM  Marissa Ponce  has presented today for surgery, with the diagnosis of left knee end stage osteoarthritis  The various methods of treatment have been discussed with the patient and family. After consideration of risks, benefits and other options for treatment, the patient has consented to  Procedure(s): LEFT TOTAL KNEE ARTHROPLASTY (Left) as a surgical intervention .  The patient's history has been reviewed, patient examined, no change in status, stable for surgery.  I have reviewed the patient's chart and labs.  Questions were answered to the patient's satisfaction.     Jahari Wiginton,STEVEN R

## 2015-05-16 NOTE — Transfer of Care (Signed)
Immediate Anesthesia Transfer of Care Note  Patient: Marissa Ponce  Procedure(s) Performed: Procedure(s): LEFT TOTAL KNEE ARTHROPLASTY (Left)  Patient Location: PACU  Anesthesia Type:Spinal  Level of Consciousness: awake, alert , oriented and patient cooperative  Airway & Oxygen Therapy: Patient Spontanous Breathing and Patient connected to nasal cannula oxygen  Post-op Assessment: Report given to RN and Post -op Vital signs reviewed and stable  Post vital signs: Reviewed and stable  Last Vitals:  Filed Vitals:   05/16/15 1317  BP: 157/62  Pulse: 91  Temp: 36.7 C  Resp: 20    Complications: No apparent anesthesia complications

## 2015-05-16 NOTE — Anesthesia Preprocedure Evaluation (Addendum)
Anesthesia Evaluation  Patient identified by MRN, date of birth, ID band Patient awake    Reviewed: Allergy & Precautions, NPO status , Patient's Chart, lab work & pertinent test results  History of Anesthesia Complications (+) PONV and history of anesthetic complications  Airway Mallampati: II  TM Distance: >3 FB Neck ROM: Full    Dental  (+) Upper Dentures, Lower Dentures   Pulmonary neg shortness of breath, neg sleep apnea, neg COPD, neg recent URI, former smoker,    breath sounds clear to auscultation       Cardiovascular negative cardio ROS   Rhythm:Regular     Neuro/Psych  Headaches, neg Seizures negative psych ROS   GI/Hepatic negative GI ROS, Neg liver ROS,   Endo/Other  Morbid obesity  Renal/GU negative Renal ROS     Musculoskeletal  (+) Arthritis ,   Abdominal   Peds  Hematology negative hematology ROS (+)   Anesthesia Other Findings   Reproductive/Obstetrics                            Anesthesia Physical Anesthesia Plan  ASA: II  Anesthesia Plan: Regional and Spinal   Post-op Pain Management:    Induction:   Airway Management Planned: Nasal Cannula, Natural Airway and Simple Face Mask  Additional Equipment: None  Intra-op Plan:   Post-operative Plan:   Informed Consent: I have reviewed the patients History and Physical, chart, labs and discussed the procedure including the risks, benefits and alternatives for the proposed anesthesia with the patient or authorized representative who has indicated his/her understanding and acceptance.   Dental advisory given  Plan Discussed with: CRNA and Surgeon  Anesthesia Plan Comments:        Anesthesia Quick Evaluation

## 2015-05-16 NOTE — Anesthesia Procedure Notes (Addendum)
Anesthesia Regional Block:  Femoral nerve block  Pre-Anesthetic Checklist: ,, timeout performed, Correct Patient, Correct Site, Correct Laterality, Correct Procedure, Correct Position, site marked, Risks and benefits discussed,  Surgical consent,  Pre-op evaluation,  At surgeon's request and post-op pain management  Laterality: Lower and Left  Prep: chloraprep       Needles:  Injection technique: Single-shot  Needle Type: Echogenic Stimulator Needle          Additional Needles:  Procedures: ultrasound guided (picture in chart) and nerve stimulator Femoral nerve block  Nerve Stimulator or Paresthesia:  Response: quad, 0.5 mA,   Additional Responses:   Narrative:  Injection made incrementally with aspirations every 5 mL.  Performed by: Personally  Anesthesiologist: Nephtali Docken  Additional Notes: H+P and labs reviewed, risks and benefits discussed with patient, procedure tolerated well without complications   Spinal Patient location during procedure: OR Staffing Anesthesiologist: Waniya Hoglund Preanesthetic Checklist Completed: patient identified, surgical consent, pre-op evaluation, timeout performed, IV checked, risks and benefits discussed and monitors and equipment checked Spinal Block Patient position: sitting Prep: site prepped and draped and DuraPrep Patient monitoring: heart rate, cardiac monitor, continuous pulse ox and blood pressure Approach: midline Location: L3-4 Injection technique: single-shot Needle Needle type: Pencan  Needle gauge: 24 G Needle length: 10 cm Assessment Sensory level: T6

## 2015-05-17 LAB — CBC
HCT: 35.4 % — ABNORMAL LOW (ref 36.0–46.0)
Hemoglobin: 11.2 g/dL — ABNORMAL LOW (ref 12.0–15.0)
MCH: 27.8 pg (ref 26.0–34.0)
MCHC: 31.6 g/dL (ref 30.0–36.0)
MCV: 87.8 fL (ref 78.0–100.0)
Platelets: 223 10*3/uL (ref 150–400)
RBC: 4.03 MIL/uL (ref 3.87–5.11)
RDW: 14.4 % (ref 11.5–15.5)
WBC: 8.1 10*3/uL (ref 4.0–10.5)

## 2015-05-17 LAB — BASIC METABOLIC PANEL
ANION GAP: 6 (ref 5–15)
BUN: 6 mg/dL (ref 6–20)
CALCIUM: 8.7 mg/dL — AB (ref 8.9–10.3)
CHLORIDE: 103 mmol/L (ref 101–111)
CO2: 28 mmol/L (ref 22–32)
CREATININE: 0.73 mg/dL (ref 0.44–1.00)
GFR calc non Af Amer: >60 mL/min (ref >60)
Glucose, Bld: 121 mg/dL — ABNORMAL HIGH (ref 65–99)
Potassium: 4 mmol/L (ref 3.5–5.1)
SODIUM: 137 mmol/L (ref 135–145)

## 2015-05-17 LAB — PROTIME-INR
INR: 1.13 (ref 0.00–1.49)
Prothrombin Time: 14.7 seconds (ref 11.6–15.2)

## 2015-05-17 MED ORDER — WARFARIN SODIUM 5 MG PO TABS
5.0000 mg | ORAL_TABLET | Freq: Once | ORAL | Status: AC
  Administered 2015-05-17: 5 mg via ORAL
  Filled 2015-05-17: qty 1

## 2015-05-17 NOTE — Progress Notes (Signed)
Subjective: 1 Day Post-Op Procedure(s) (LRB): LEFT TOTAL KNEE ARTHROPLASTY (Left) Patient reports pain as mild.  Pt states nerve block has worn off but concerned that her knee buckled when she tried to stand with PT.  Knee immobilizer was on but had slid down.  Tolerating regular diet.  Objective: Vital signs in last 24 hours: Temp:  [97.5 F (36.4 C)-98.8 F (37.1 C)] 98.6 F (37 C) (04/01 0514) Pulse Rate:  [69-92] 92 (04/01 0514) Resp:  [10-20] 16 (04/01 0514) BP: (108-157)/(36-71) 111/36 mmHg (04/01 0514) SpO2:  [94 %-100 %] 97 % (04/01 0514) Weight:  [89.086 kg (196 lb 6.4 oz)] 89.086 kg (196 lb 6.4 oz) (03/31 1317)  Intake/Output from previous day: 03/31 0701 - 04/01 0700 In: 1610 [I.V.:1500; IV Piggyback:110] Out: 1750 [Urine:1650; Blood:100] Intake/Output this shift:     Recent Labs  05/16/15 1959 05/17/15 0514  HGB 12.5 11.2*    Recent Labs  05/16/15 1959 05/17/15 0514  WBC 11.8* 8.1  RBC 4.46 4.03  HCT 38.8 35.4*  PLT 244 223    Recent Labs  05/16/15 1959 05/17/15 0514  NA  --  137  K  --  4.0  CL  --  103  CO2  --  28  BUN  --  6  CREATININE 0.67 0.73  GLUCOSE  --  121*  CALCIUM  --  8.7*    Recent Labs  05/17/15 0514  INR 1.13    PE:  wn wd woman in nad.  L knee dressed and dry. NVI at L foot.  2/5 strength at left quad.  Assessment/Plan: 1 Day Post-Op Procedure(s) (LRB): LEFT TOTAL KNEE ARTHROPLASTY (Left) Up with therapy  Knee immobilizer adjusted.  Reassured pt that the quad can take a few days to "wake up" after surgery.  Plan d/c home Monday.  Continue PT.  Wylene Simmer 05/17/2015, 9:59 AM

## 2015-05-17 NOTE — Op Note (Signed)
NAMEMANROOP, DSOUZA              ACCOUNT NO.:  [id number]  MEDICAL RECORD NO.:  [id number]  LOCATION:  5N24C                        FACILITY:  [facility]  PHYSICIAN:  Doran Heater. Veverly Fells, M.D. DATE OF BIRTH:  20-Apr-1940  DATE OF PROCEDURE:  05/16/2015 DATE OF DISCHARGE:                              OPERATIVE REPORT   PREOPERATIVE DIAGNOSIS:  Left knee end-stage osteoarthritis.  POSTOPERATIVE DIAGNOSIS:  Left knee end-stage osteoarthritis.  PROCEDURE PERFORMED:  Left total knee replacement using DePuy Attune prosthesis.  ATTENDING SURGEON:  Doran Heater. Veverly Fells, M.D.  ASSISTANT:  Abbott Pao. Dixon, PA-C, who was scrubbed the entire procedure and necessary for satisfactory completion of surgery.  ANESTHESIA:  Spinal anesthesia was used plus femoral block.  ESTIMATED BLOOD LOSS:  Minimal.  FLUID REPLACED:  1200 mL crystalloid.  INSTRUMENT COUNTS:  Correct.  COMPLICATIONS:  There were no complications.  ANTIBIOTICS:  Perioperative antibiotics were given.  TOURNIQUET TIME:  1 hour and 30 minutes at 350 mmHg.  INDICATIONS:  The patient is a 75 year old female with worsening left knee pain secondary to end-stage arthritis.  The patient has bone-on- bone changes on x-ray and has had severe pain including rest pain and night pain, presents with refractory knee pain despite conservative management.  She desires total knee replacement to restore function and eliminate pain to her knee.  Informed consent obtained.  DESCRIPTION OF PROCEDURE:  After an adequate level of anesthesia achieved, the patient was positioned supine on the operating room table. Left leg correctly identified, nonsterile tourniquet was placed on the proximal thigh.  Left leg was sterilely prepped and draped in usual manner.  Time-out called.  We elevated the limb and exsanguinated using an Esmarch bandage, elevated the tourniquet to 350 mmHg.  I placed the knee in flexion.  Longitudinal midline incision was created with  a 10- blade scalpel.  Dissection down through the subcutaneous tissues.  A fresh 10-blade was used for medial parapatellar arthrotomy.  Lateral patellofemoral ligament was divided.  The patella was everted.  We went ahead and entered the distal femur using a step-cut drill.  We then placed our distal femoral resection guide intramedullary, resecting 3 degrees left, 9 mm.  Once we did that, we then went ahead and resected ACL, PCL and meniscal tissues subluxing the tibia anteriorly.  We then did our tibial cut with an external alignment jig, taking 2 mm off the affected lateral side.  We were pleased with our cut with a 3-degree posterior slope and then checked our extension gap, which was size 6. We then went ahead and completed our femoral preparation, sized the femur to size 6 anterior down and then used our 4-in-1 block for the chamfer cuts and the anterior and posterior cuts.  Once those cuts were made, we used the laminar spreader to remove excess posterior osteophytes off the posterior femoral condyles and released the capsule. We then went ahead and addressed the tibia, subluxed again anteriorly and then prepared for a size 5 tibia with a modular drilling keel punch. Once we had the tibial tray in place, we then completed our femoral preparation with the box cut guide for the 6 left femur.  Once we had completed  those cuts, we impacted the 6 left femur trial into place and then drilled our lug holes.  We then reduced the knee with a size 6 poly insert fitting the 6 femur.  We have reduced the knee, had nice soft tissue balancing.  We went ahead and then resurfaced the patella going from the 25-mm thickness down to between 16 and 18-mm thickness, taking 9.5 mm off.  At that point, we made our cut with our oscillating saw. We have drilled our lug holes for a 32 patellar button.  We impacted the 32 into place and then trialed the patella and we had nice tracking with no-touch  technique.  We went ahead and removed all trial components, pulse irrigated the bone and then dried and then used DePuy high- viscosity cement to cement the components into place, tibia, femur and patella.  Used a patellar clamp and reduced the knee with a size 6 poly. We held the knee in extension until the cement was hardened, we removed the excess cement using 0.25-inch curved osteotome.  We then went ahead and selected the real 6 poly and went ahead and placed that onto the tibial tray and then reduced the knee with a nice little snap on the medial side indicating nice tension there.  We checked the knee in flexion, extension, nice stability, excellent patellar tracking and full range of motion.  We irrigated the knee with pulse irrigator.  We then went ahead and closed the parapatellar arthrotomy with interrupted #1 Vicryl suture followed by 0 Vicryl and 2-0 Vicryl layers, subcutaneous closure.  Before we closed the capsule, we went ahead and initiated IV TXA administration from anesthesia and then we did topical TXA into the capsule and allowed that being while we completed our medial parapatellar arthrotomy closure.  We then sucked the remaining TXA out topically and then finished the closure.  Then, did the 0 and 2-0 Vicryl subcutaneous closure and 4-0 Monocryl for skin.  Steri-Strips applied followed by sterile dressing and knee immobilizer.  The patient tolerated the surgery well.     Doran Heater. Veverly Fells, M.D.     SRN/MEDQ  D:  05/16/2015  T:  05/17/2015  Job:  [id number]

## 2015-05-17 NOTE — Progress Notes (Deleted)
Physical Therapy Treatment Patient Details Name: Marissa Ponce MRN: [id number] DOB: July 31, 1940 Today's Date: 05/17/2015    History of Present Illness 75 y.o. s/p Lt TKA. Pt reports she has had Rt shoulder surgery in past and procedure on Rt knee. History includes osteoarthritis.     PT Comments    Pt is POD #1 and is moving well despite some mild buckling in left knee during stance phase of gait. She compensates well with use of bil UEs on RW.  I anticipate she will progress well enough to d/c home with husband's assist and HHPT f/u at discharge.  PT to follow acutely for deficits listed below.     Follow Up Recommendations  Home health PT;Supervision for mobility/OOB     Equipment Recommendations  None recommended by PT    Recommendations for Other Services   NA     Precautions / Restrictions Precautions Precautions: Fall;Knee Precaution Booklet Issued: No Precaution Comments: educated on knee precautions Required Braces or Orthoses: Knee Immobilizer - Left Knee Immobilizer - Left: Other (comment) (in bed unless in CPM, wore for gait due to buckling 4/1) Restrictions Weight Bearing Restrictions: Yes LLE Weight Bearing: Weight bearing as tolerated    Mobility  Bed Mobility Overal bed mobility: Needs Assistance Bed Mobility: Supine to Sit     Supine to sit: Supervision     General bed mobility comments: Pt was OOB in the chair  Transfers Overall transfer level: Needs assistance Equipment used: Rolling walker (2 wheeled) Transfers: Sit to/from Stand Sit to Stand: Min assist         General transfer comment: Min assist to support trunk during transition to stand.  Pt needed verbal cues for safe hand placement.   Ambulation/Gait Ambulation/Gait assistance: Min assist;+2 safety/equipment (chair to follow for safety "I pass out easily") Ambulation Distance (Feet): 45 Feet Assistive device: Rolling walker (2 wheeled) Gait Pattern/deviations: Step-to  pattern;Antalgic Gait velocity: decreased Gait velocity interpretation: Below normal speed for age/gender General Gait Details: Pt with moderately antalgic gait, flexed, buckling knee with stance.  Verbal cues for terminal knee extension (actively think about and do) when putting weight through her left leg.  KI kept on due to reports of buckling.  Husband pushed chair behind, but we ultimately did not need it.           Balance Overall balance assessment: Needs assistance Sitting-balance support: Feet supported;No upper extremity supported Sitting balance-Leahy Scale: Good     Standing balance support: Bilateral upper extremity supported Standing balance-Leahy Scale: Poor                      Cognition Arousal/Alertness: Awake/alert Behavior During Therapy: WFL for tasks assessed/performed Overall Cognitive Status: Within Functional Limits for tasks assessed                      Exercises Total Joint Exercises Ankle Circles/Pumps: AROM;Both Quad Sets: AROM;Left;10 reps Towel Squeeze: AROM;Both;10 reps Heel Slides: AAROM;Left;10 reps Hip ABduction/ADduction: AROM;Left;10 reps Straight Leg Raises: AAROM;Left;10 reps        Pertinent Vitals/Pain Pain Assessment: 0-10 Pain Score: 6  Pain Location: left knee Pain Descriptors / Indicators: Aching;Burning Pain Intervention(s): Limited activity within patient's tolerance;Monitored during session;Repositioned    Home Living Family/patient expects to be discharged to:: Private residence Living Arrangements: Spouse/significant other Available Help at Discharge: Family;Available 24 hours/day Type of Home: House Home Access: Stairs to enter Entrance Stairs-Rails: None Home Layout: Two level;Able to live on  main level with bedroom/bathroom (does not go upstairs) Home Equipment: Toilet riser (thinks she has access to 3 in 1)      Prior Function Level of Independence: Independent      Comments: was not using  an AD for gait   PT Goals (current goals can now be found in the care plan section) Acute Rehab PT Goals Patient Stated Goal: to go home tomorrow if she can PT Goal Formulation: With patient Time For Goal Achievement: 05/24/15 Potential to Achieve Goals: Good    Frequency  7X/week            End of Session Equipment Utilized During Treatment: Gait belt Activity Tolerance: Patient limited by pain Patient left: with call bell/phone within reach;in chair;with family/visitor present (in bathroom, set up to wash off, husband supervising)     Time: [id number] PT Time Calculation (min) (ACUTE ONLY): 26 min  Charges:  $Gait Training: 8-22 mins                      Rhealyn Cullen B. Weinert, [place], DPT [phone]   05/17/2015, 1:44 PM

## 2015-05-17 NOTE — Progress Notes (Signed)
ANTICOAGULATION CONSULT NOTE  Pharmacy Consult:  Coumadin Indication:  VTE prophylaxis  Allergies  Allergen Reactions  . Percocet [Oxycodone-Acetaminophen] Nausea And Vomiting  . Prednisone Other (See Comments)    wheeze  . Sulfonamide Derivatives Rash    Patient Measurements: Weight: 196 lb 6.4 oz (89.086 kg)  Vital Signs: Temp: 98.6 F (37 C) (04/01 0514) Temp Source: Oral (04/01 0514) BP: 111/36 mmHg (04/01 0514) Pulse Rate: 92 (04/01 0514)  Labs:  Recent Labs  05/16/15 1959 05/17/15 0514  HGB 12.5 11.2*  HCT 38.8 35.4*  PLT 244 223  LABPROT  --  14.7  INR  --  1.13  CREATININE 0.67 0.73    Estimated Creatinine Clearance: 70.8 mL/min (by C-G formula based on Cr of 0.73).   Medical History: Past Medical History  Diagnosis Date  . Complication of anesthesia   . PONV (postoperative nausea and vomiting)   . Migraines      history of none recent  . Shortness of breath dyspnea     with exertion  . Arthritis   . Dysrhythmia     with caffine, "PVC's"       Assessment: 75 yo female initiating warfarin for VTE prophylaxis s/p left TKA, INR 1.13. Mild drop in hgb, 12.5 > 11.2, plts stable. On empiric iron supplements.   Goal of Therapy:  INR 2-3    Plan:  - Warfarin 5 mg po x1 - Daily PT / INR   Harvel Quale  05/17/2015 9:07 AM

## 2015-05-17 NOTE — Progress Notes (Signed)
Orthopedic Tech Progress Note Patient Details:  Marissa Ponce Jan 26, 1941 [id number]  Patient ID: Marissa Ponce, female   DOB: Ponce 12, 1942, 75 y.o.   MRN: [id number] Applied cpm 0-60  Karolee Stamps 05/17/2015, 5:40 AM

## 2015-05-17 NOTE — Evaluation (Signed)
Physical Therapy Evaluation Patient Details Name: Marissa Ponce MRN: [id number] DOB: Oct 21, 1940 Today's Date: 05/17/2015   History of Present Illness  75 y.o. s/p Lt TKA. Pt reports she has had Rt shoulder surgery in past and procedure on Rt knee. History includes osteoarthritis.   Clinical Impression  Pt is POD #1 and is moving well despite some mild buckling in left knee during stance phase of gait. She compensates well with use of bil UEs on RW.  I anticipate she will progress well enough to d/c home with husband's assist and HHPT f/u at discharge.  PT to follow acutely for deficits listed below.      Follow Up Recommendations Home health PT;Supervision for mobility/OOB    Equipment Recommendations  None recommended by PT    Recommendations for Other Services   NA    Precautions / Restrictions Precautions Precautions: Fall;Knee Precaution Booklet Issued: No Precaution Comments: educated on knee precautions Required Braces or Orthoses: Knee Immobilizer - Left Knee Immobilizer - Left: Other (comment) (in bed unless in CPM, wore for gait due to buckling 4/1) Restrictions Weight Bearing Restrictions: Yes LLE Weight Bearing: Weight bearing as tolerated      Mobility  Bed Mobility Overal bed mobility: Needs Assistance Bed Mobility: Supine to Sit     Supine to sit: Supervision     General bed mobility comments: Pt was OOB in the chair  Transfers Overall transfer level: Needs assistance Equipment used: Rolling walker (2 wheeled) Transfers: Sit to/from Stand Sit to Stand: Min assist         General transfer comment: Min assist to support trunk during transition to stand.  Pt needed verbal cues for safe hand placement.   Ambulation/Gait Ambulation/Gait assistance: Min assist;+2 safety/equipment (chair to follow for safety "I pass out easily") Ambulation Distance (Feet): 45 Feet Assistive device: Rolling walker (2 wheeled) Gait Pattern/deviations: Step-to  pattern;Antalgic Gait velocity: decreased Gait velocity interpretation: Below normal speed for age/gender General Gait Details: Pt with moderately antalgic gait, flexed, buckling knee with stance.  Verbal cues for terminal knee extension (actively think about and do) when putting weight through her left leg.  KI kept on due to reports of buckling.  Husband pushed chair behind, but we ultimately did not need it.          Balance Overall balance assessment: Needs assistance Sitting-balance support: Feet supported;No upper extremity supported Sitting balance-Leahy Scale: Good     Standing balance support: Bilateral upper extremity supported Standing balance-Leahy Scale: Poor                               Pertinent Vitals/Pain Pain Assessment: 0-10 Pain Score: 6  Pain Location: left knee Pain Descriptors / Indicators: Aching;Burning Pain Intervention(s): Limited activity within patient's tolerance;Monitored during session;Repositioned    Home Living Family/patient expects to be discharged to:: Private residence Living Arrangements: Spouse/significant other Available Help at Discharge: Family;Available 24 hours/day Type of Home: House Home Access: Stairs to enter Entrance Stairs-Rails: None Entrance Stairs-Number of Steps: 1 Home Layout: Two level;Able to live on main level with bedroom/bathroom (does not go upstairs) Home Equipment: Toilet riser (thinks she has access to 3 in 1)      Prior Function Level of Independence: Independent         Comments: was not using an AD for gait        Extremity/Trunk Assessment   Upper Extremity Assessment: Defer to OT evaluation  Lower Extremity Assessment: LLE deficits/detail   LLE Deficits / Details: left leg with normal post op pain and weakness, ankle 3/5, knee 2/5, hip 2+/5  Cervical / Trunk Assessment: Normal  Communication   Communication: No difficulties  Cognition Arousal/Alertness:  Awake/alert Behavior During Therapy: WFL for tasks assessed/performed Overall Cognitive Status: Within Functional Limits for tasks assessed                         Exercises Total Joint Exercises Ankle Circles/Pumps: AROM;Both Quad Sets: AROM;Left;10 reps Towel Squeeze: AROM;Both;10 reps Heel Slides: AAROM;Left;10 reps Hip ABduction/ADduction: AROM;Left;10 reps Straight Leg Raises: AAROM;Left;10 reps      Assessment/Plan    PT Assessment Patient needs continued PT services  PT Diagnosis Difficulty walking;Abnormality of gait;Generalized weakness;Acute pain   PT Problem List Decreased strength;Decreased range of motion;Decreased activity tolerance;Decreased balance;Decreased mobility;Decreased knowledge of use of DME;Pain  PT Treatment Interventions DME instruction;Gait training;Stair training;Functional mobility training;Therapeutic activities;Therapeutic exercise;Balance training;Neuromuscular re-education;Patient/family education;Manual techniques;Modalities   PT Goals (Current goals can be found in the Care Plan section) Acute Rehab PT Goals Patient Stated Goal: to go home tomorrow if she can PT Goal Formulation: With patient Time For Goal Achievement: 05/24/15 Potential to Achieve Goals: Good    Frequency 7X/week           End of Session Equipment Utilized During Treatment: Gait belt Activity Tolerance: Patient limited by pain Patient left: with call bell/phone within reach;in chair;with family/visitor present (in bathroom, set up to wash off, husband supervising)           Time: [id number] PT Time Calculation (min) (ACUTE ONLY): 26 min   Charges:   PT Evaluation $PT Eval Low Complexity: 1 Procedure PT Treatments $Gait Training: 8-22 mins        Tammera Engert B. [place], [place], DPT [phone]   05/17/2015, 1:46 PM

## 2015-05-17 NOTE — Evaluation (Signed)
Occupational Therapy Evaluation Patient Details Name: Marissa Ponce MRN: [id number] DOB: Jan 21, 1941 Today's Date: 05/17/2015    History of Present Illness 75 y.o. s/p Lt TKA. Pt reports she has had Rt shoulder surgery in past and procedure on Rt knee. History includes osteoarthritis.    Clinical Impression   Pt s/p above. Pt independent with ADLs, PTA. Feel pt will benefit from acute OT to increase independence prior to d/c. Left knee buckling in session. Will plan to see again tomorrow for additional session.    Follow Up Recommendations  No OT follow up;Supervision - Intermittent    Equipment Recommendations  3 in 1 bedside comode;Other (comment) (if pt does not have access to one; AE)    Recommendations for Other Services       Precautions / Restrictions Precautions Precautions: Fall;Knee Precaution Booklet Issued: No Precaution Comments: educated on knee precautions Required Braces or Orthoses: Knee Immobilizer - Left Knee Immobilizer - Left: Other (comment) (in bed unless in CPM) Restrictions Weight Bearing Restrictions: Yes LLE Weight Bearing: Weight bearing as tolerated      Mobility Bed Mobility Overal bed mobility: Needs Assistance Bed Mobility: Supine to Sit     Supine to sit: Supervision        Transfers Overall transfer level: Needs assistance Equipment used: Rolling walker (2 wheeled) Transfers: Sit to/from Stand Sit to Stand: Min assist         General transfer comment: assist to boost to come to standing position. Pt took a few steps to chair and turned to sit in chair-Min assist (left knee buckling)    Balance      Assist to block left knee when pt taking a few steps and turning to chair-used RW.                                      ADL Overall ADL's : Needs assistance/impaired     Grooming: Set up;Sitting               Lower Body Dressing: Sit to/from stand;Moderate assistance   Toilet Transfer: Minimal  assistance;Ambulation;RW (took a few steps and turned to sit in chair) Toilet Transfer Details (indicate cue type and reason): Min assist for transfers and to take a few steps Toileting- Water quality scientist and Hygiene: Min guard;Sit to/from stand       Functional mobility during ADLs: Minimal assistance;Rolling walker General ADL Comments: Educated on LB dressing technique.     Vision     Perception     Praxis      Pertinent Vitals/Pain Pain Assessment: 0-10 Pain Score:  (4-5) Pain Location: Lt knee Pain Intervention(s): Monitored during session;Repositioned     Hand Dominance     Extremity/Trunk Assessment Upper Extremity Assessment Upper Extremity Assessment: Overall WFL for tasks assessed   Lower Extremity Assessment Lower Extremity Assessment: Defer to PT evaluation       Communication Communication Communication: No difficulties   Cognition Arousal/Alertness: Awake/alert Behavior During Therapy: WFL for tasks assessed/performed Overall Cognitive Status: Within Functional Limits for tasks assessed                     General Comments       Exercises       Shoulder Instructions      Home Living Family/patient expects to be discharged to::  (sounded like home was plan) Living Arrangements: Spouse/significant other Available Help  at Discharge: Family;Available 24 hours/day Type of Home: House Home Access: Stairs to enter [employer] of Steps: 1   Home Layout: Two level;Able to live on main level with bedroom/bathroom     Bathroom Shower/Tub: Tub only;Walk-in shower   Bathroom Toilet:  (elevated toilet)     Home Equipment: Toilet riser (thinks she has access to 3 in 1)          Prior Functioning/Environment Level of Independence: Independent (unsure if she was using assistive device, PTA)            OT Diagnosis: Acute pain   OT Problem List: Decreased strength;Decreased range of motion;Decreased activity  tolerance;Impaired balance (sitting and/or standing);Pain;Decreased knowledge of use of DME or AE   OT Treatment/Interventions: Self-care/ADL training;DME and/or AE instruction;Patient/family education;Balance training;Therapeutic activities    OT Goals(Current goals can be found in the care plan section) Acute Rehab OT Goals Patient Stated Goal: go upstairs OT Goal Formulation: With patient Time For Goal Achievement: 05/24/15 Potential to Achieve Goals: Good ADL Goals Pt Will Perform Lower Body Dressing: sit to/from stand;with set-up;with supervision (with or without AE) Pt Will Transfer to Toilet: with min guard assist;ambulating;grab bars (elevated toilet ) Pt Will Perform Toileting - Clothing Manipulation and hygiene: with supervision;sit to/from stand Pt Will Perform Tub/Shower Transfer: Shower transfer;with min guard assist;ambulating;rolling walker (shower DME TBD)  OT Frequency: Min 2X/week   Barriers to D/C:            Co-evaluation              End of Session Equipment Utilized During Treatment: Gait belt; left knee immobilizer CPM Left Knee CPM Left Knee: Off Nurse Communication: Other (comment) (called tech about pt's knee buckling; pt wants bath)  Activity Tolerance: Patient tolerated treatment well Patient left: in chair;with call bell/phone within reach   Time: [id number] OT Time Calculation (min): 20 min Charges:  OT General Charges $OT Visit: 1 Procedure OT Evaluation $OT Eval Moderate Complexity: 1 Procedure G-CodesBenito Mccreedy OTR/L [id number] 05/17/2015, 10:15 AM

## 2015-05-17 NOTE — Progress Notes (Signed)
Physical Therapy Treatment Patient Details Name: Marissa Ponce MRN: [id number] DOB: 09/16/1940 Today's Date: 05/17/2015    History of Present Illness 75 y.o. s/p Lt TKA. Pt reports she has had Rt shoulder surgery in past and procedure on Rt knee. History includes osteoarthritis.     PT Comments    Pt is POD #1 second session and is moving better.  Her biggest complaint is of heartburn and nausea.  She was able to progress gait further down the hallway with RW during this PM session and progress her exercises.  Less buckling in left knee noted with gait.  PT to follow acutely until d/c.     Follow Up Recommendations  Home health PT;Supervision for mobility/OOB     Equipment Recommendations  None recommended by PT    Recommendations for Other Services   NA     Precautions / Restrictions Precautions Precautions: Fall;Knee Precaution Booklet Issued: Yes (comment) Precaution Comments: knee handout given, no pillow precaution reviewed Required Braces or Orthoses: Knee Immobilizer - Left Knee Immobilizer - Left: Other (comment) (in bed unless in CPM, wore for gait due to buckling 4/1) Restrictions LLE Weight Bearing: Weight bearing as tolerated    Mobility  Bed Mobility Overal bed mobility: Needs Assistance Bed Mobility: Supine to Sit     Supine to sit: Supervision     General bed mobility comments: Pt was able to make her way EOB using railing with HOB mildly elevated.   Transfers Overall transfer level: Needs assistance Equipment used: Rolling walker (2 wheeled) Transfers: Sit to/from Stand Sit to Stand: Min guard         General transfer comment: min guard assist to steady pt for balance and stabilize RW as she transitions hands from bed.  Verbal cues for safe hand placement, uncontrolled descent to sit despite use of hands.   Ambulation/Gait Ambulation/Gait assistance: Min guard;+2 safety/equipment (chair to follow for safety "I pass out easlily") Ambulation  Distance (Feet): 65 Feet Assistive device: Rolling walker (2 wheeled) Gait Pattern/deviations: Step-through pattern;Antalgic Gait velocity: decreased Gait velocity interpretation: Below normal speed for age/gender General Gait Details: Pt with better knee control and less buckling during gait this afternoon.  Chair to follow but not needed.            Balance Overall balance assessment: Needs assistance Sitting-balance support: Feet supported;No upper extremity supported Sitting balance-Leahy Scale: Good     Standing balance support: Bilateral upper extremity supported Standing balance-Leahy Scale: Poor                      Cognition Arousal/Alertness: Awake/alert Behavior During Therapy: WFL for tasks assessed/performed Overall Cognitive Status: Within Functional Limits for tasks assessed                      Exercises Total Joint Exercises Ankle Circles/Pumps: AROM;Both Quad Sets: AROM;Left;10 reps Towel Squeeze: AROM;Both;10 reps Short Arc QuadSinclair Ship;Left;10 reps Heel Slides: AAROM;Left;10 reps Hip ABduction/ADduction: AROM;Left;10 reps Straight Leg Raises: AAROM;Left;10 reps    General Comments        Pertinent Vitals/Pain Pain Assessment: 0-10 Pain Score: 5  Pain Location: left knee Pain Descriptors / Indicators: Aching;Burning Pain Intervention(s): Monitored during session;Limited activity within patient's tolerance;Repositioned    Home Living Family/patient expects to be discharged to:: Private residence Living Arrangements: Spouse/significant other Available Help at Discharge: Family;Available 24 hours/day Type of Home: House Home Access: Stairs to enter Entrance Stairs-Rails: None Home Layout: Two level;Able to live  on main level with bedroom/bathroom (does not go upstairs) Home Equipment: Toilet riser (thinks she has access to 3 in 1)      Prior Function Level of Independence: Independent      Comments: was not using an AD for  gait   PT Goals (current goals can now be found in the care plan section) Acute Rehab PT Goals Patient Stated Goal: to go home tomorrow if she can PT Goal Formulation: With patient Time For Goal Achievement: 05/24/15 Potential to Achieve Goals: Good Progress towards PT goals: Progressing toward goals    Frequency  7X/week    PT Plan Current plan remains appropriate       End of Session Equipment Utilized During Treatment: Left knee immobilizer Activity Tolerance: Patient limited by pain Patient left: in chair;with call bell/phone within reach;with family/visitor present     Time: [id number] PT Time Calculation (min) (ACUTE ONLY): 30 min  Charges:  $Gait Training: 8-22 mins $Therapeutic Exercise: 8-22 mins                      Nimo Verastegui B. McMillin, [place], DPT [phone]   05/17/2015, 4:16 PM

## 2015-05-17 NOTE — Discharge Instructions (Signed)
Ice to the knee as much as you can.  Keep the knee incision clean and dry and covered for one week, then ok to get wet in the shower.  Elevate the leg propping under the ankle or foot to encourage extension.  DO NOT PROP behind the knee.  Do exercises every hour while awake at home.  Weight bear as tolerated.  Use the CPM machine for 4-6 hours per day in two hour sessions.  Follow up with Dr Veverly Fells in two weeks  [phone]  Information on my medicine - Coumadin   (Warfarin)  This medication education was reviewed with me or my healthcare representative as part of my discharge preparation.   Why was Coumadin prescribed for you? Coumadin was prescribed for you because you have a blood clot or a medical condition that can cause an increased risk of forming blood clots. Blood clots can cause serious health problems by blocking the flow of blood to the heart, lung, or brain. Coumadin can prevent harmful blood clots from forming. As a reminder your indication for Coumadin is:   Blood Clot Prevention After Orthopedic Surgery  What test will check on my response to Coumadin? While on Coumadin (warfarin) you will need to have an INR test regularly to ensure that your dose is keeping you in the desired range. The INR (international normalized ratio) number is calculated from the result of the laboratory test called prothrombin time (PT).  If an INR APPOINTMENT HAS NOT ALREADY BEEN MADE FOR YOU please schedule an appointment to have this lab work done by your health care provider within 7 days. Your INR goal is usually a number between:  2 to 3 or your provider may give you a more narrow range like 2-2.5.  Ask your health care provider during an office visit what your goal INR is.  What  do you need to  know  About  COUMADIN? Take Coumadin (warfarin) exactly as prescribed by your healthcare provider about the same time each day.  DO NOT stop taking without talking to the doctor who prescribed the  medication.  Stopping without other blood clot prevention medication to take the place of Coumadin may increase your risk of developing a new clot or stroke.  Get refills before you run out.  What do you do if you miss a dose? If you miss a dose, take it as soon as you remember on the same day then continue your regularly scheduled regimen the next day.  Do not take two doses of Coumadin at the same time.  Important Safety Information A possible side effect of Coumadin (Warfarin) is an increased risk of bleeding. You should call your healthcare provider right away if you experience any of the following: ? Bleeding from an injury or your nose that does not stop. ? Unusual colored urine (red or dark brown) or unusual colored stools (red or black). ? Unusual bruising for unknown reasons. ? A serious fall or if you hit your head (even if there is no bleeding).  Some foods or medicines interact with Coumadin (warfarin) and might alter your response to warfarin. To help avoid this: ? Eat a balanced diet, maintaining a consistent amount of Vitamin K. ? Notify your provider about major diet changes you plan to make. ? Avoid alcohol or limit your intake to 1 drink for women and 2 drinks for men per day. (1 drink is 5 oz. wine, 12 oz. beer, or 1.5 oz. liquor.)  Make sure that  ANY health care provider who prescribes medication for you knows that you are taking Coumadin (warfarin).  Also make sure the healthcare provider who is monitoring your Coumadin knows when you have started a new medication including herbals and non-prescription products.  Coumadin (Warfarin)  Major Drug Interactions  Increased Warfarin Effect Decreased Warfarin Effect  Alcohol (large quantities) Antibiotics (esp. Septra/Bactrim, Flagyl, Cipro) Amiodarone (Cordarone) Aspirin (ASA) Cimetidine (Tagamet) Megestrol (Megace) NSAIDs (ibuprofen, naproxen, etc.) Piroxicam (Feldene) Propafenone (Rythmol SR) Propranolol  (Inderal) Isoniazid (INH) Posaconazole (Noxafil) Barbiturates (Phenobarbital) Carbamazepine (Tegretol) Chlordiazepoxide (Librium) Cholestyramine (Questran) Griseofulvin Oral Contraceptives Rifampin Sucralfate (Carafate) Vitamin K   Coumadin (Warfarin) Major Herbal Interactions  Increased Warfarin Effect Decreased Warfarin Effect  Garlic Ginseng Ginkgo biloba Coenzyme Q10 Green tea St. Johns wort    Coumadin (Warfarin) FOOD Interactions  Eat a consistent number of servings per week of foods HIGH in Vitamin K (1 serving =  cup)  Collards (cooked, or boiled & drained) Kale (cooked, or boiled & drained) Mustard greens (cooked, or boiled & drained) Parsley *serving size only =  cup Spinach (cooked, or boiled & drained) Swiss chard (cooked, or boiled & drained) Turnip greens (cooked, or boiled & drained)  Eat a consistent number of servings per week of foods MEDIUM-HIGH in Vitamin K (1 serving = 1 cup)  Asparagus (cooked, or boiled & drained) Broccoli (cooked, boiled & drained, or raw & chopped) Brussel sprouts (cooked, or boiled & drained) *serving size only =  cup Lettuce, raw (green leaf, endive, romaine) Spinach, raw Turnip greens, raw & chopped   These websites have more information on Coumadin (warfarin):  FailFactory.se; VeganReport.com.au;

## 2015-05-17 NOTE — Clinical Social Work Note (Signed)
Clinical Social Worker received standing order referral for possible ST-SNF placement.  Chart reviewed.  OT recommending no follow up and intermittent supervision.  PT pending, however per notes, patient independent with ADL's.  RN Case Manager to follow up with patient to discuss home health needs if deemed appropriate prior to discharge.    CSW signing off - please re consult if social work needs arise.  Barbette Or, LCSW (Covering weekend [place] [phone]) [phone]

## 2015-05-18 LAB — CBC
HEMATOCRIT: 33.2 % — AB (ref 36.0–46.0)
HEMOGLOBIN: 10.6 g/dL — AB (ref 12.0–15.0)
MCH: 27.9 pg (ref 26.0–34.0)
MCHC: 31.9 g/dL (ref 30.0–36.0)
MCV: 87.4 fL (ref 78.0–100.0)
Platelets: 204 10*3/uL (ref 150–400)
RBC: 3.8 MIL/uL — AB (ref 3.87–5.11)
RDW: 14.4 % (ref 11.5–15.5)
WBC: 7.8 10*3/uL (ref 4.0–10.5)

## 2015-05-18 LAB — PROTIME-INR
INR: 1.46 (ref 0.00–1.49)
Prothrombin Time: 17.8 seconds — ABNORMAL HIGH (ref 11.6–15.2)

## 2015-05-18 MED ORDER — WARFARIN SODIUM 5 MG PO TABS
2.5000 mg | ORAL_TABLET | Freq: Once | ORAL | Status: DC
  Administered 2015-05-18: 2.5 mg via ORAL

## 2015-05-18 MED ORDER — PANTOPRAZOLE SODIUM 40 MG PO TBEC
40.0000 mg | DELAYED_RELEASE_TABLET | Freq: Every day | ORAL | Status: DC
  Administered 2015-05-18 – 2015-05-19 (×2): 40 mg via ORAL
  Filled 2015-05-18 (×2): qty 1

## 2015-05-18 NOTE — Progress Notes (Signed)
Physical Therapy Treatment Patient Details Name: Marissa Ponce MRN: [id number] DOB: 13-Oct-1940 Today's Date: 05/18/2015    History of Present Illness 75 y.o. s/p Lt TKA. Pt reports she has had Rt shoulder surgery in past and procedure on Rt knee. History includes osteoarthritis.     PT Comments    Pt is POD #2 first session and is moving better. Pt complains of feeling "woozy" and having nausea. She ambulated further down the hallway with RW. Step through gait pattern became more fluid as distance increased. Less buckling in left knee noted with gait. Left knee range of motion improving well. Good strength and balance progression. PT to follow acutely until d/c.   On track to d/c home tomorrow. May require only one session.     Follow Up Recommendations  Home health PT;Supervision for mobility/OOB     Equipment Recommendations  None recommended by PT    Recommendations for Other Services       Precautions / Restrictions Precautions Precautions: Fall;Knee Precaution Booklet Issued: No Precaution Comments: educated on knee precautions Required Braces or Orthoses: Knee Immobilizer - Left Knee Immobilizer - Left: Other (comment) (in bed unless in CPM, wore during gait to prevent buckling) Restrictions Weight Bearing Restrictions: Yes LLE Weight Bearing: Weight bearing as tolerated    Mobility  Bed Mobility Overal bed mobility: Modified Independent                Transfers Overall transfer level: Needs assistance Equipment used: Rolling walker (2 wheeled) Transfers: Sit to/from Stand Sit to Stand: Min guard          General transfer comment: cue for hand placement and control of descent to chair.  Ambulation/Gait Ambulation/Gait assistance: Min guard;+2 safety/equipment (Chair to follow for safety due to lightheadedness) Ambulation Distance (Feet): 130 Feet Assistive device: Rolling walker (2 wheeled) Gait Pattern/deviations: Step-through  pattern;Antalgic Gait velocity: progress increase Gait velocity interpretation: Below normal speed for age/gender General Gait Details: Pt with better knee control, less buckling, improved step through pattern, increased fluidity, and increased speed. Chair to follow but not needed. Reported feeling "woozy" and stiff.    Stairs            Wheelchair Mobility    Modified Rankin (Stroke Patients Only)       Balance Overall balance assessment: Modified Independent Sitting-balance support: Feet supported;No upper extremity supported Sitting balance-Leahy Scale: Good     Standing balance support: Bilateral upper extremity supported Standing balance-Leahy Scale: Poor Standing balance comment: needs walker for support while weight shifting                    Cognition Arousal/Alertness: Awake/alert Behavior During Therapy: WFL for tasks assessed/performed Overall Cognitive Status: Within Functional Limits for tasks assessed                      Exercises Total Joint Exercises Ankle Circles/Pumps: AROM;Both Quad Sets: AROM;Left;10 reps Towel Squeeze: AROM;Both;10 reps Straight Leg Raises: AAROM;Left;10 reps Long Arc Quad: AAROM;10 reps;Seated;Left  Goniometric ROM: Approximately 10-85 deg.    General Comments        Pertinent Vitals/Pain Pain Assessment: 0-10 Pain Score: 4  Pain Location: left knee Pain Descriptors / Indicators: Sore;Aching Pain Intervention(s): Monitored during session;Repositioned;Other (comment) (Refuse pain meds)    Home Living                      Prior Function  PT Goals (current goals can now be found in the care plan section) Acute Rehab PT Goals Patient Stated Goal: to go home tomorrow if she can PT Goal Formulation: With patient Time For Goal Achievement: 05/24/15 Potential to Achieve Goals: Good Progress towards PT goals: Progressing toward goals    Frequency  7X/week    PT Plan Current  plan remains appropriate    Co-evaluation             End of Session Equipment Utilized During Treatment: Left knee immobilizer Activity Tolerance: Patient tolerated treatment well Patient left: in chair;with call bell/phone within reach;with family/visitor present     Time: [id number] PT Time Calculation (min) (ACUTE ONLY): 34 min  Charges:                       G Codes:      Jabier Mutton Jun 10, 2015, 11:16 AM    Jabier Mutton, SPT Acute Rehab Services [phone]

## 2015-05-18 NOTE — Progress Notes (Signed)
ANTICOAGULATION CONSULT NOTE  Pharmacy Consult:  Coumadin Indication:  VTE prophylaxis  Allergies  Allergen Reactions  . Percocet [Oxycodone-Acetaminophen] Nausea And Vomiting  . Prednisone Other (See Comments)    wheeze  . Sulfonamide Derivatives Rash    Patient Measurements: Weight: 196 lb 6.4 oz (89.086 kg)  Vital Signs: Temp: 98.3 F (36.8 C) (04/02 0659) Temp Source: Oral (04/02 0659) BP: 118/58 mmHg (04/02 0659) Pulse Rate: 93 (04/02 0659)  Labs:  Recent Labs  05/16/15 1959 05/17/15 0514 05/18/15 0549  HGB 12.5 11.2* 10.6*  HCT 38.8 35.4* 33.2*  PLT 244 223 204  LABPROT  --  14.7 17.8*  INR  --  1.13 1.46  CREATININE 0.67 0.73  --     Estimated Creatinine Clearance: 70.8 mL/min (by C-G formula based on Cr of 0.73).   Medical History: Past Medical History  Diagnosis Date  . Complication of anesthesia   . PONV (postoperative nausea and vomiting)   . Migraines      history of none recent  . Shortness of breath dyspnea     with exertion  . Arthritis   . Dysrhythmia     with caffine, "PVC's"       Assessment: 75 yo female initiating warfarin for VTE prophylaxis s/p left TKA.  INR up this am to 1.4. Hgb continues to trend down 11.2>>10.6, plts stable. On empiric iron supplements.  Noted unwitnessed fall overnight, no injuries sustained. Will lower dose of warfarin tonight with rise in INR.   Goal of Therapy:  INR 2-3    Plan:  - Warfarin 2.5 mg po x1 - Daily PT / INR  Erin Hearing PharmD., BCPS Clinical Pharmacist Pager [phone] 05/18/2015 9:50 AM

## 2015-05-18 NOTE — Progress Notes (Signed)
   Subjective:  Patient reports pain as mild to moderate.  Progressing well with PT.  Objective:   VITALS:   Filed Vitals:   05/17/15 1350 05/17/15 2212 05/18/15 0659 05/18/15 0900  BP: 116/58 151/70 118/58 149/63  Pulse: 85 112 93 83  Temp: 98.5 F (36.9 C) 98 F (36.7 C) 98.3 F (36.8 C)   TempSrc: Oral Oral Oral   Resp: 16 16 18    Weight:      SpO2: 96% 96% 99% 98%    ABD soft Sensation intact distally Intact pulses distally Dorsiflexion/Plantar flexion intact Incision: scant drainage Dressing changed: incis c/d/i  Lab Results  Component Value Date   WBC 7.8 05/18/2015   HGB 10.6* 05/18/2015   HCT 33.2* 05/18/2015   MCV 87.4 05/18/2015   PLT 204 05/18/2015   BMET    Component Value Date/Time   NA 137 05/17/2015 0514   K 4.0 05/17/2015 0514   CL 103 05/17/2015 0514   CO2 28 05/17/2015 0514   GLUCOSE 121* 05/17/2015 0514   BUN 6 05/17/2015 0514   CREATININE 0.73 05/17/2015 0514   CALCIUM 8.7* 05/17/2015 0514   GFRNONAA >60 05/17/2015 0514   GFRAA >60 05/17/2015 0514     Assessment/Plan: 2 Days Post-Op   Active Problems:   S/P total knee replacement using cement   WBAT with walker PT/OT coumadin Up with therapy Plan for discharge tomorrow   Elie Goody 05/18/2015, 12:11 PM   Rod Can, MD Cell [phone]

## 2015-05-18 NOTE — Progress Notes (Signed)
Around 2200 last night, tech went in patient's room to assist in helping her to the bathroom.  While patient was on the toilet, she was instructed to call tech who remained in the room, and tech will help her back to bed.  Bathroom door was closed to give patient privacy. Tech remained in room making the bed.  Patient got up from toilet without calling for assistance.  She felt light headed and sustained an unwitnessed fall.  As per patient "I held on to the rail and lowered myself to the floor. I did not hit my head or nothing.  I am all right".  Zero injuries observed.  Patient's husband was contacted and he lectured patient to next time call for assistance and not get up on her own.  On-call notified.  No new orders given.  Patient has been alert and oriented and we will continue to monitor.

## 2015-05-18 NOTE — Progress Notes (Signed)
Occupational Therapy Treatment Patient Details Name: Marissa Ponce MRN: [id number] DOB: 1940/03/06 Today's Date: 05/18/2015    History of present illness 75 y.o. s/p Lt TKA. Pt reports she has had Rt shoulder surgery in past and procedure on Rt knee. History includes osteoarthritis.    OT comments  Pt progressing. Education provided in session. OT signing off. Pt will have family to assist at home.  Follow Up Recommendations  No OT follow up;Supervision - Intermittent    Equipment Recommendations  None recommended by OT (pt reports she has access to 3 in 1)    Recommendations for Other Services      Precautions / Restrictions Precautions Precautions: Fall;Knee Precaution Booklet Issued: No Precaution Comments: educated on knee precautions Required Braces or Orthoses: Knee Immobilizer - Left Knee Immobilizer - Left: Other (comment) (in bed unless in CPM) Restrictions Weight Bearing Restrictions: Yes LLE Weight Bearing: Weight bearing as tolerated       Mobility Bed Mobility Overal bed mobility: Modified Independent for supine to sit                Transfers Overall transfer level: Needs assistance Equipment used: Rolling walker (2 wheeled) Transfers: Sit to/from Stand Sit to Stand: Min guard         General transfer comment: cue for hand placement    Balance      Min guard for simulated shower transfer with pt using RW.                             ADL Overall ADL's : Needs assistance/impaired                     Lower Body Dressing: Sitting/lateral leans Lower Body Dressing Details (indicate cue type and reason): donned/doffed left sock with supervision; OT donned knee immobilizer; cue to try not to twist knee. Toilet Transfer: Min guard;Ambulation;RW (sit to stand from chair)       Tub/ Shower Transfer: Walk-in shower;Min guard;Ambulation;Rolling walker   Functional mobility during ADLs: Min guard;Rolling walker General ADL  Comments: Reviewed LB dressing technique. Educated on safety such as sitting for LB ADLs, use of bag on walker, safe footwear, and recommended someone be with her for shower transfer. Educated on shower transfer technique.  Educated on benefit of of reaching down to manage left sock as it allows knee to bend. Mentioned use of reacher if she wanted one.      Vision                     Perception     Praxis      Cognition  Awake/Alert Behavior During Therapy: WFL for tasks assessed/performed Overall Cognitive Status: Within Functional Limits for tasks assessed                       Extremity/Trunk Assessment               Exercises     Shoulder Instructions       General Comments      Pertinent Vitals/ Pain       Pain Assessment: 0-10 Pain Score:  (3-4 in left knee and about a 10 on buttocks) Pain Location: Lt knee and buttocks Pain Descriptors / Indicators: Sore;Other (Comment) (stiffness) Pain Intervention(s): Monitored during session;Repositioned  Home Living  Prior Functioning/Environment              Frequency Min 2X/week     Progress Toward Goals  OT Goals(current goals can now be found in the care plan section)  Progress towards OT goals: Progressing toward goals-adequate for d/c  Acute Rehab OT Goals Patient Stated Goal: not stated OT Goal Formulation: With patient Time For Goal Achievement: 05/24/15 Potential to Achieve Goals: Good ADL Goals Pt Will Perform Lower Body Dressing: sit to/from stand;with set-up;with supervision (with or without AE) Pt Will Transfer to Toilet: with min guard assist;ambulating;grab bars (elevated toilet) Pt Will Perform Toileting - Clothing Manipulation and hygiene: with supervision;sit to/from stand Pt Will Perform Tub/Shower Transfer: Shower transfer;with min guard assist;ambulating;rolling walker (shower DME TBD)  Plan Discharge plan  remains appropriate    Co-evaluation                 End of Session Equipment Utilized During Treatment: Gait belt;Rolling walker;Left knee immobilizer   Activity Tolerance Patient tolerated treatment well   Patient Left in chair;Other (comment) (with PT and students)   Nurse Communication          Time: [phone] OT Time Calculation (min): 16 min  Charges: OT General Charges $OT Visit: 1 Procedure OT Treatments $Self Care/Home Management : 8-22 mins  Benito Mccreedy OTR/L [id number] 05/18/2015, 10:07 AM

## 2015-05-18 NOTE — Progress Notes (Signed)
Physical Therapy Treatment Patient Details Name: Marissa Ponce MRN: [id number] DOB: [birth year] Today's Date: 05/18/2015    History of Present Illness 76 y.o. s/p Lt TKA. Pt reports she has had Rt shoulder surgery in past and procedure on Rt knee. History includes osteoarthritis.     PT Comments    Pt is POD #2 in PM session and is moving better. Pt symptoms of nausea are subsiding. Pt completed stair training with rolling walker (2 wheels) in preparation for d/c home. Family was present and educated on stair techniques. PT to follow acutely until d/c.   Continues on track to d/c home tomorrow. Plan to reinforce stair and transfer training in next session.    Follow Up Recommendations  Home health PT;Supervision for mobility/OOB     Equipment Recommendations  None recommended by PT    Recommendations for Other Services       Precautions / Restrictions Precautions Precautions: Knee;Fall Required Braces or Orthoses: Knee Immobilizer - Left Knee Immobilizer - Left: On except when in CPM;On when out of bed or walking Restrictions Weight Bearing Restrictions: Yes LLE Weight Bearing: Weight bearing as tolerated    Mobility  Bed Mobility Overal bed mobility: Modified Independent Bed Mobility: Supine to Sit;Sit to Supine     Supine to sit: Supervision Sit to supine: Min assist (Pt needs assistance to get LLE onto the bed) Plan to educate on techniques in next session.   General bed mobility comments: Pt was able to move to EOB easily using railing and scooting  Transfers Overall transfer level: Needs assistance Equipment used: Rolling walker (2 wheeled) Transfers: Sit to/from Stand Sit to Stand: Supervision         General transfer comment: cue for hand placement  Ambulation/Gait Ambulation/Gait assistance: Min guard Ambulation Distance (Feet): 65 Feet Assistive device: Rolling walker (2 wheeled) Gait Pattern/deviations: Step-through pattern;Antalgic   Gait  velocity interpretation: Below normal speed for age/gender General Gait Details: Pt ambulating with greater confidence and less "wooziness" during PM session. No chair followed. KI worn.    Stairs Stairs: Yes Stairs assistance: Min guard Stair Management: With walker Number of Stairs: 1 General stair comments: Pt educated on stair usage and completed stairs easily.   Wheelchair Mobility    Modified Rankin (Stroke Patients Only)       Balance Overall balance assessment: Modified Independent Sitting-balance support: Feet supported;No upper extremity supported Sitting balance-Leahy Scale: Good     Standing balance support: Bilateral upper extremity supported Standing balance-Leahy Scale: Fair Standing balance comment: needs walker for support while weight shifting                    Cognition Arousal/Alertness: Awake/alert Behavior During Therapy: WFL for tasks assessed/performed Overall Cognitive Status: Within Functional Limits for tasks assessed                      Exercises      General Comments        Pertinent Vitals/Pain Pain Assessment: 0-10 Pain Score: 4  Pain Location: left knee Pain Descriptors / Indicators: Sore Pain Intervention(s): Monitored during session;Repositioned    Home Living                      Prior Function            PT Goals (current goals can now be found in the care plan section) Acute Rehab PT Goals Patient Stated Goal: to go home tomorrow  if she can PT Goal Formulation: With patient Time For Goal Achievement: 05/24/15 Potential to Achieve Goals: Good Progress towards PT goals: Progressing toward goals    Frequency  7X/week    PT Plan Current plan remains appropriate    Co-evaluation             End of Session Equipment Utilized During Treatment: Left knee immobilizer;Gait belt Activity Tolerance: Patient tolerated treatment well Patient left: in bed;with family/visitor present (left in  CPM on L knee)     Time: [id number] PT Time Calculation (min) (ACUTE ONLY): 25 min  Charges:                       G CodesJabier Mutton, SPT Acute Rehab Services [phone]   Jabier Mutton 05/18/2015, 4:47 PM

## 2015-05-19 ENCOUNTER — Encounter (HOSPITAL_COMMUNITY): Payer: Self-pay | Admitting: Orthopedic Surgery

## 2015-05-19 LAB — CBC
HEMATOCRIT: 33.2 % — AB (ref 36.0–46.0)
Hemoglobin: 10.5 g/dL — ABNORMAL LOW (ref 12.0–15.0)
MCH: 27.7 pg (ref 26.0–34.0)
MCHC: 31.6 g/dL (ref 30.0–36.0)
MCV: 87.6 fL (ref 78.0–100.0)
PLATELETS: 209 10*3/uL (ref 150–400)
RBC: 3.79 MIL/uL — ABNORMAL LOW (ref 3.87–5.11)
RDW: 14.4 % (ref 11.5–15.5)
WBC: 7.5 10*3/uL (ref 4.0–10.5)

## 2015-05-19 LAB — PROTIME-INR
INR: 1.93 — AB (ref 0.00–1.49)
Prothrombin Time: 21.9 seconds — ABNORMAL HIGH (ref 11.6–15.2)

## 2015-05-19 MED ORDER — WARFARIN SODIUM 5 MG PO TABS: 2.5000 mg | ORAL_TABLET | Freq: Once | ORAL | Status: DC

## 2015-05-19 NOTE — Care Management Important Message (Signed)
Important Message  Patient Details  Name: Marissa Ponce MRN: [id number] Date of Birth: 06-27-1940   Medicare Important Message Given:  Yes    [name] P [name] 05/19/2015, 1:32 PM

## 2015-05-19 NOTE — Anesthesia Postprocedure Evaluation (Signed)
Anesthesia Post Note  Patient: Marissa Ponce  Procedure(s) Performed: Procedure(s) (LRB): LEFT TOTAL KNEE ARTHROPLASTY (Left)  Patient location during evaluation: PACU Anesthesia Type: Spinal and Regional Level of consciousness: awake Pain management: pain level controlled Vital Signs Assessment: post-procedure vital signs reviewed and stable Respiratory status: spontaneous breathing Cardiovascular status: stable Postop Assessment: spinal receding and no signs of nausea or vomiting Anesthetic complications: no    Last Vitals:  Filed Vitals:   05/18/15 2055 05/19/15 0741  BP: 132/67 107/55  Pulse: 92 93  Temp: 36.9 C 36.8 C  Resp: 16 16    Last Pain:  Filed Vitals:   05/19/15 0743  PainSc: 5                  Davarion Cuffee

## 2015-05-19 NOTE — Care Management Note (Signed)
Case Management Note  Patient Details  Name: SHAREETA SJOBLOM MRN: [id number] Date of Birth: 26-Apr-1940  Subjective/Objective:             S/p left total knee arthroplasty       Action/Plan: Patient chose [facility], contacted Tiffany at [facility] and set up [place], [place] and RN. [employer] is delivering CPM to home, patient had rolling walker and 3N1. Patient's husband will be assisting patient after discharge.   Expected Discharge Date:                  Expected Discharge Plan:  [place]  In-House Referral:  NA  Discharge planning Services  CM Consult  Post Acute Care Choice:  Home Health, Durable Medical Equipment Choice offered to:  Patient  DME Arranged:  CPM DME Agency:  [employer]  HH Arranged:  RN, PT, OT HH Agency:  [facility]  Status of Service:  Completed, signed off  Medicare Important Message Given:  Yes Date Medicare IM Given:    Medicare IM give by:    Date Additional Medicare IM Given:    Additional Medicare Important Message give by:     If discussed at [place] of Stay Meetings, dates discussed:    Additional Comments:  Nila Nephew, RN 05/19/2015, 4:49 PM

## 2015-05-19 NOTE — Progress Notes (Signed)
Reviewed Discharge and medicaton, pt fully understood with no questions

## 2015-05-19 NOTE — Discharge Summary (Signed)
Physician Discharge Summary   Patient ID: Marissa Ponce MRN: [id number] DOB/AGE: [birth year] 75 y.o.  Admit date: 05/16/2015 Discharge date: 05/19/2015  Admission Diagnoses:  Active Problems:   S/P total knee replacement using cement   Discharge Diagnoses:  Same   Surgeries: Procedure(s): LEFT TOTAL KNEE ARTHROPLASTY on 05/16/2015   Consultants: PT, OT, D/C planning  Discharged Condition: Stable  Hospital Course: Marissa Ponce is an 75 y.o. female who was admitted 05/16/2015 with a chief complaint of left knee pain, and found to have a diagnosis of primary left knee arthritis.  They were brought to the operating room on 05/16/2015 and underwent the above named procedures.    The patient had an uncomplicated hospital course and was stable for discharge.  Recent vital signs:  Filed Vitals:   05/18/15 1506 05/18/15 2055  BP: 136/58 132/67  Pulse: 92 92  Temp: 98.4 F (36.9 C) 98.4 F (36.9 C)  Resp: 18 16    Recent laboratory studies:  Results for orders placed or performed during the hospital encounter of 05/16/15  CBC  Result Value Ref Range   WBC 11.8 (H) 4.0 - 10.5 K/uL   RBC 4.46 3.87 - 5.11 MIL/uL   Hemoglobin 12.5 12.0 - 15.0 g/dL   HCT 38.8 36.0 - 46.0 %   MCV 87.0 78.0 - 100.0 fL   MCH 28.0 26.0 - 34.0 pg   MCHC 32.2 30.0 - 36.0 g/dL   RDW 14.3 11.5 - 15.5 %   Platelets 244 150 - 400 K/uL  Creatinine, serum  Result Value Ref Range   Creatinine, Ser 0.67 0.44 - 1.00 mg/dL   GFR calc non Af Amer >60 >60 mL/min   GFR calc Af Amer >60 >60 mL/min  Protime-INR  Result Value Ref Range   Prothrombin Time 14.7 11.6 - 15.2 seconds   INR 1.13 0.00 - 1.49  CBC  Result Value Ref Range   WBC 8.1 4.0 - 10.5 K/uL   RBC 4.03 3.87 - 5.11 MIL/uL   Hemoglobin 11.2 (L) 12.0 - 15.0 g/dL   HCT 35.4 (L) 36.0 - 46.0 %   MCV 87.8 78.0 - 100.0 fL   MCH 27.8 26.0 - 34.0 pg   MCHC 31.6 30.0 - 36.0 g/dL   RDW 14.4 11.5 - 15.5 %   Platelets 223 150 - 400 K/uL  Basic  metabolic panel  Result Value Ref Range   Sodium 137 135 - 145 mmol/L   Potassium 4.0 3.5 - 5.1 mmol/L   Chloride 103 101 - 111 mmol/L   CO2 28 22 - 32 mmol/L   Glucose, Bld 121 (H) 65 - 99 mg/dL   BUN 6 6 - 20 mg/dL   Creatinine, Ser 0.73 0.44 - 1.00 mg/dL   Calcium 8.7 (L) 8.9 - 10.3 mg/dL   GFR calc non Af Amer >60 >60 mL/min   GFR calc Af Amer >60 >60 mL/min   Anion gap 6 5 - 15  Protime-INR  Result Value Ref Range   Prothrombin Time 17.8 (H) 11.6 - 15.2 seconds   INR 1.46 0.00 - 1.49  CBC  Result Value Ref Range   WBC 7.8 4.0 - 10.5 K/uL   RBC 3.80 (L) 3.87 - 5.11 MIL/uL   Hemoglobin 10.6 (L) 12.0 - 15.0 g/dL   HCT 33.2 (L) 36.0 - 46.0 %   MCV 87.4 78.0 - 100.0 fL   MCH 27.9 26.0 - 34.0 pg   MCHC 31.9 30.0 - 36.0 g/dL  RDW 14.4 11.5 - 15.5 %   Platelets 204 150 - 400 K/uL  Protime-INR  Result Value Ref Range   Prothrombin Time 21.9 (H) 11.6 - 15.2 seconds   INR 1.93 (H) 0.00 - 1.49  CBC  Result Value Ref Range   WBC 7.5 4.0 - 10.5 K/uL   RBC 3.79 (L) 3.87 - 5.11 MIL/uL   Hemoglobin 10.5 (L) 12.0 - 15.0 g/dL   HCT 33.2 (L) 36.0 - 46.0 %   MCV 87.6 78.0 - 100.0 fL   MCH 27.7 26.0 - 34.0 pg   MCHC 31.6 30.0 - 36.0 g/dL   RDW 14.4 11.5 - 15.5 %   Platelets 209 150 - 400 K/uL    Discharge Medications:     Medication List    TAKE these medications        aspirin EC 81 MG tablet  Take 81 mg by mouth every other day.     b complex vitamins tablet  Take 1 tablet by mouth daily.     HYDROcodone-acetaminophen 5-325 MG tablet  Commonly known as:  NORCO  Take 1-2 tablets by mouth every 6 (six) hours as needed for moderate pain.     methocarbamol 500 MG tablet  Commonly known as:  ROBAXIN  Take 1 tablet (500 mg total) by mouth 3 (three) times daily as needed.     naproxen sodium 220 MG tablet  Commonly known as:  ANAPROX  Take 220 mg by mouth 2 (two) times daily with a meal.     traMADol 50 MG tablet  Commonly known as:  ULTRAM  Take 50 mg by mouth every  6 (six) hours as needed for moderate pain.     warfarin 5 MG tablet  Commonly known as:  COUMADIN  Take 1 tablet (5 mg total) by mouth daily. Take as directed per the pharmacist for INR target from 2.5-3.0 for 30 days post op        Diagnostic Studies: Dg Knee Left Port  05/16/2015  CLINICAL DATA:  Status post total knee replacement. EXAM: PORTABLE LEFT KNEE - 1-2 VIEW COMPARISON:  None. FINDINGS: LEFT knee total arthroplasty. Prosthetic components are well seated. No fracture. IMPRESSION: No complication following LEFT knee arthroplasty. Electronically Signed   By: Suzy Bouchard M.D.   On: 05/16/2015 17:27    Disposition:  Home with Home health PT, OT, D/C planning       Follow-up Information    Follow up with Danelly Hassinger,STEVEN R, MD. Call in 2 weeks.   Specialty:  Orthopedic Surgery   Why:  [phone]   Contact information:   [address] [place] [address] [phone]        Signed: Augustin Schooling 05/19/2015, 7:14 AM

## 2015-05-19 NOTE — Progress Notes (Signed)
Physical Therapy Treatment Patient Details Name: Marissa Ponce MRN: [id number] DOB: 12-26-1940 Today's Date: 05/19/2015    History of Present Illness 75 y.o. s/p Lt TKA. Pt reports she has had Rt shoulder surgery in past and procedure on Rt knee. History includes osteoarthritis.     PT Comments    Pt is POD #3 in AM session and is ready for d/c. Pt reports significant soreness/stiffness in left knee during activity. Pt ambulated well without left KI. Pt and husband demonstrated competency on stairs while wearing left KI.   Continues on track for d/c today. Home Health PT to follow up.       Follow Up Recommendations  Home health PT;Supervision for mobility/OOB     Equipment Recommendations  None recommended by PT    Recommendations for Other Services       Precautions / Restrictions Precautions Precautions: Knee;Fall Required Braces or Orthoses: Knee Immobilizer - Left Knee Immobilizer - Left: Other (comment) (Wear for stairs and at night in bed) Restrictions Weight Bearing Restrictions: Yes LLE Weight Bearing: Weight bearing as tolerated    Mobility  Bed Mobility Overal bed mobility: Modified Independent Bed Mobility: Supine to Sit;Sit to Supine     Supine to sit: Supervision Sit to supine: Modified independent (Device/Increase time) (Pt needs assistance to get LLE onto the bed)   General bed mobility comments: Pt can move independently to EOB. Must sit EOB for few minutes before standing  Transfers Overall transfer level: Modified independent Equipment used: Rolling walker (2 wheeled)   Sit to Stand: Supervision         General transfer comment: cue for hand placement  Ambulation/Gait Ambulation/Gait assistance: Modified independent (Device/Increase time) Ambulation Distance (Feet): 130 Feet Assistive device: Rolling walker (2 wheeled) Gait Pattern/deviations: Step-through pattern;Decreased stride length;Antalgic Gait velocity: decreased  Gait  velocity interpretation: Below normal speed for age/gender General Gait Details: Pt is ambulating well without the KI within her pain limitations.    Stairs Stairs: Yes Stairs assistance: Modified independent (Device/Increase time) Stair Management: With walker Number of Stairs: 2 General stair comments: Pt and husband demonstrated learning and competency on stairs. Completed stairs easily.   Wheelchair Mobility    Modified Rankin (Stroke Patients Only)       Balance                                    Cognition Arousal/Alertness: Awake/alert Behavior During Therapy: WFL for tasks assessed/performed Overall Cognitive Status: Within Functional Limits for tasks assessed                      Exercises      General Comments        Pertinent Vitals/Pain Pain Assessment: 0-10 Pain Score: 2  (10 during walking) Pain Location: left knee Pain Descriptors / Indicators: Sore Pain Intervention(s): Monitored during session;Repositioned    Home Living                      Prior Function            PT Goals (current goals can now be found in the care plan section) Acute Rehab PT Goals Patient Stated Goal: To go home today PT Goal Formulation: With patient Time For Goal Achievement: 05/24/15 Potential to Achieve Goals: Good Progress towards PT goals: Progressing toward goals    Frequency  7X/week    PT  Plan Current plan remains appropriate    Co-evaluation             End of Session Equipment Utilized During Treatment: Left knee immobilizer;Gait belt Activity Tolerance: Patient limited by pain;Patient tolerated treatment well Patient left: in chair;with call bell/phone within reach;with family/visitor present     Time: [id number] PT Time Calculation (min) (ACUTE ONLY): 24 min  Charges:                       G CodesJabier Mutton, SPT Acute Rehab Services [phone]    Jabier Mutton 05/19/2015, 11:57 AM  I was  present during the PT session and agree with patient status and findings as outlined by Joeline, SPT.   Roney Marion, [place]  Acute Rehabilitation Services Pager [phone] Office [phone]    05/19/15 1100  PT Time Calculation  PT Start Time (ACUTE ONLY) 1044  PT Stop Time (ACUTE ONLY) 1108  PT Time Calculation (min) (ACUTE ONLY) 24 min  PT General Charges  $$ ACUTE PT VISIT 1 Procedure  PT Treatments  $Gait Training 23-37 mins

## 2015-05-19 NOTE — Progress Notes (Addendum)
ANTICOAGULATION CONSULT NOTE  Pharmacy Consult:  Coumadin Indication:  VTE prophylaxis  Allergies  Allergen Reactions  . Percocet [Oxycodone-Acetaminophen] Nausea And Vomiting  . Prednisone Other (See Comments)    wheeze  . Sulfonamide Derivatives Rash    Patient Measurements: Weight: 196 lb 6.4 oz (89.086 kg)  Vital Signs: Temp: 98.3 F (36.8 C) (04/03 0741) Temp Source: Oral (04/03 0741) BP: 107/55 mmHg (04/03 0741) Pulse Rate: 93 (04/03 0741)  Labs:  Recent Labs  05/16/15 1959 05/17/15 0514 05/18/15 0549 05/19/15 0416 05/19/15 0535  HGB 12.5 11.2* 10.6* 10.5*  --   HCT 38.8 35.4* 33.2* 33.2*  --   PLT 244 223 204 209  --   LABPROT  --  14.7 17.8*  --  21.9*  INR  --  1.13 1.46  --  1.93*  CREATININE 0.67 0.73  --   --   --     Estimated Creatinine Clearance: 70.8 mL/min (by C-G formula based on Cr of 0.73).   Medical History: Past Medical History  Diagnosis Date  . Complication of anesthesia   . PONV (postoperative nausea and vomiting)   . Migraines      history of none recent  . Shortness of breath dyspnea     with exertion  . Arthritis   . Dysrhythmia     with caffine, "PVC's"    Assessment: 75 yo female initiating warfarin for VTE prophylaxis s/p left TKA.  INR up this am to 1.93. Hgb continues to trend down 11.2>>10.5, plts stable. On empiric iron supplements. Patient was not given dose last night per [facility]. Safety zone entered on RN.  Goal of Therapy:  INR 2-3  Plan:  Warfarin 2.5mg  tonight x1 Daily INR Monitor s/sx of bleeding   Andrey Cota. Diona Foley, PharmD, BCPS Clinical Pharmacist Pager [phone] 05/19/2015 11:18 AM

## 2015-05-19 NOTE — Progress Notes (Signed)
Orthopedics Progress Note  Subjective: Pain controlled.  Patient up and walking well  Objective:  Filed Vitals:   05/18/15 1506 05/18/15 2055  BP: 136/58 132/67  Pulse: 92 92  Temp: 98.4 F (36.9 C) 98.4 F (36.9 C)  Resp: 18 16    General: Awake and alert  Musculoskeletal: L knee dressing CDI, no swelling in the calf or ankle, AROM without pain Neurovascularly intact  Lab Results  Component Value Date   WBC 7.5 05/19/2015   HGB 10.5* 05/19/2015   HCT 33.2* 05/19/2015   MCV 87.6 05/19/2015   PLT 209 05/19/2015       Component Value Date/Time   NA 137 05/17/2015 0514   K 4.0 05/17/2015 0514   CL 103 05/17/2015 0514   CO2 28 05/17/2015 0514   GLUCOSE 121* 05/17/2015 0514   BUN 6 05/17/2015 0514   CREATININE 0.73 05/17/2015 0514   CALCIUM 8.7* 05/17/2015 0514   GFRNONAA >60 05/17/2015 0514   GFRAA >60 05/17/2015 0514    Lab Results  Component Value Date   INR 1.93* 05/19/2015   INR 1.46 05/18/2015   INR 1.13 05/17/2015    Assessment/Plan: POD #3 s/p Procedure(s): LEFT TOTAL KNEE ARTHROPLASTY D/C home with home health PT, OT, RN for coumadin monitoring F/U in two weeks  Doran Heater. Veverly Fells, MD 05/19/2015 7:10 AM

## 2015-05-20 DIAGNOSIS — Z96652 Presence of left artificial knee joint: Secondary | ICD-10-CM | POA: Diagnosis not present

## 2015-05-20 DIAGNOSIS — M1712 Unilateral primary osteoarthritis, left knee: Secondary | ICD-10-CM | POA: Diagnosis not present

## 2015-05-21 DIAGNOSIS — Z7901 Long term (current) use of anticoagulants: Secondary | ICD-10-CM | POA: Diagnosis not present

## 2015-05-21 DIAGNOSIS — Z7982 Long term (current) use of aspirin: Secondary | ICD-10-CM | POA: Diagnosis not present

## 2015-05-21 DIAGNOSIS — Z471 Aftercare following joint replacement surgery: Secondary | ICD-10-CM | POA: Diagnosis not present

## 2015-05-21 DIAGNOSIS — Z96652 Presence of left artificial knee joint: Secondary | ICD-10-CM | POA: Diagnosis not present

## 2015-05-22 DIAGNOSIS — Z7982 Long term (current) use of aspirin: Secondary | ICD-10-CM | POA: Diagnosis not present

## 2015-05-22 DIAGNOSIS — Z7901 Long term (current) use of anticoagulants: Secondary | ICD-10-CM | POA: Diagnosis not present

## 2015-05-22 DIAGNOSIS — Z96652 Presence of left artificial knee joint: Secondary | ICD-10-CM | POA: Diagnosis not present

## 2015-05-22 DIAGNOSIS — Z471 Aftercare following joint replacement surgery: Secondary | ICD-10-CM | POA: Diagnosis not present

## 2015-05-23 DIAGNOSIS — Z7982 Long term (current) use of aspirin: Secondary | ICD-10-CM | POA: Diagnosis not present

## 2015-05-23 DIAGNOSIS — Z471 Aftercare following joint replacement surgery: Secondary | ICD-10-CM | POA: Diagnosis not present

## 2015-05-23 DIAGNOSIS — Z96652 Presence of left artificial knee joint: Secondary | ICD-10-CM | POA: Diagnosis not present

## 2015-05-23 DIAGNOSIS — Z7901 Long term (current) use of anticoagulants: Secondary | ICD-10-CM | POA: Diagnosis not present

## 2015-05-24 DIAGNOSIS — Z471 Aftercare following joint replacement surgery: Secondary | ICD-10-CM | POA: Diagnosis not present

## 2015-05-24 DIAGNOSIS — Z7901 Long term (current) use of anticoagulants: Secondary | ICD-10-CM | POA: Diagnosis not present

## 2015-05-24 DIAGNOSIS — Z96652 Presence of left artificial knee joint: Secondary | ICD-10-CM | POA: Diagnosis not present

## 2015-05-24 DIAGNOSIS — Z7982 Long term (current) use of aspirin: Secondary | ICD-10-CM | POA: Diagnosis not present

## 2015-05-26 DIAGNOSIS — Z7901 Long term (current) use of anticoagulants: Secondary | ICD-10-CM | POA: Diagnosis not present

## 2015-05-26 DIAGNOSIS — Z7982 Long term (current) use of aspirin: Secondary | ICD-10-CM | POA: Diagnosis not present

## 2015-05-26 DIAGNOSIS — Z96652 Presence of left artificial knee joint: Secondary | ICD-10-CM | POA: Diagnosis not present

## 2015-05-26 DIAGNOSIS — Z471 Aftercare following joint replacement surgery: Secondary | ICD-10-CM | POA: Diagnosis not present

## 2015-05-28 DIAGNOSIS — Z7982 Long term (current) use of aspirin: Secondary | ICD-10-CM | POA: Diagnosis not present

## 2015-05-28 DIAGNOSIS — Z471 Aftercare following joint replacement surgery: Secondary | ICD-10-CM | POA: Diagnosis not present

## 2015-05-28 DIAGNOSIS — Z7901 Long term (current) use of anticoagulants: Secondary | ICD-10-CM | POA: Diagnosis not present

## 2015-05-28 DIAGNOSIS — Z96652 Presence of left artificial knee joint: Secondary | ICD-10-CM | POA: Diagnosis not present

## 2015-05-30 DIAGNOSIS — Z7901 Long term (current) use of anticoagulants: Secondary | ICD-10-CM | POA: Diagnosis not present

## 2015-05-30 DIAGNOSIS — Z7982 Long term (current) use of aspirin: Secondary | ICD-10-CM | POA: Diagnosis not present

## 2015-05-30 DIAGNOSIS — Z471 Aftercare following joint replacement surgery: Secondary | ICD-10-CM | POA: Diagnosis not present

## 2015-05-30 DIAGNOSIS — Z96652 Presence of left artificial knee joint: Secondary | ICD-10-CM | POA: Diagnosis not present

## 2015-06-02 DIAGNOSIS — Z471 Aftercare following joint replacement surgery: Secondary | ICD-10-CM | POA: Diagnosis not present

## 2015-06-02 DIAGNOSIS — Z7901 Long term (current) use of anticoagulants: Secondary | ICD-10-CM | POA: Diagnosis not present

## 2015-06-02 DIAGNOSIS — Z96652 Presence of left artificial knee joint: Secondary | ICD-10-CM | POA: Diagnosis not present

## 2015-06-02 DIAGNOSIS — Z7982 Long term (current) use of aspirin: Secondary | ICD-10-CM | POA: Diagnosis not present

## 2015-06-03 DIAGNOSIS — Z7982 Long term (current) use of aspirin: Secondary | ICD-10-CM | POA: Diagnosis not present

## 2015-06-03 DIAGNOSIS — Z96652 Presence of left artificial knee joint: Secondary | ICD-10-CM | POA: Diagnosis not present

## 2015-06-03 DIAGNOSIS — Z7901 Long term (current) use of anticoagulants: Secondary | ICD-10-CM | POA: Diagnosis not present

## 2015-06-03 DIAGNOSIS — Z471 Aftercare following joint replacement surgery: Secondary | ICD-10-CM | POA: Diagnosis not present

## 2015-06-04 DIAGNOSIS — Z7901 Long term (current) use of anticoagulants: Secondary | ICD-10-CM | POA: Diagnosis not present

## 2015-06-04 DIAGNOSIS — Z471 Aftercare following joint replacement surgery: Secondary | ICD-10-CM | POA: Diagnosis not present

## 2015-06-04 DIAGNOSIS — Z96652 Presence of left artificial knee joint: Secondary | ICD-10-CM | POA: Diagnosis not present

## 2015-06-04 DIAGNOSIS — Z7982 Long term (current) use of aspirin: Secondary | ICD-10-CM | POA: Diagnosis not present

## 2015-06-05 DIAGNOSIS — Z7901 Long term (current) use of anticoagulants: Secondary | ICD-10-CM | POA: Diagnosis not present

## 2015-06-05 DIAGNOSIS — Z471 Aftercare following joint replacement surgery: Secondary | ICD-10-CM | POA: Diagnosis not present

## 2015-06-05 DIAGNOSIS — Z7982 Long term (current) use of aspirin: Secondary | ICD-10-CM | POA: Diagnosis not present

## 2015-06-05 DIAGNOSIS — Z96652 Presence of left artificial knee joint: Secondary | ICD-10-CM | POA: Diagnosis not present

## 2015-06-06 DIAGNOSIS — Z96652 Presence of left artificial knee joint: Secondary | ICD-10-CM | POA: Diagnosis not present

## 2015-06-06 DIAGNOSIS — Z7982 Long term (current) use of aspirin: Secondary | ICD-10-CM | POA: Diagnosis not present

## 2015-06-06 DIAGNOSIS — Z7901 Long term (current) use of anticoagulants: Secondary | ICD-10-CM | POA: Diagnosis not present

## 2015-06-06 DIAGNOSIS — Z471 Aftercare following joint replacement surgery: Secondary | ICD-10-CM | POA: Diagnosis not present

## 2015-06-10 DIAGNOSIS — Z7982 Long term (current) use of aspirin: Secondary | ICD-10-CM | POA: Diagnosis not present

## 2015-06-10 DIAGNOSIS — Z96652 Presence of left artificial knee joint: Secondary | ICD-10-CM | POA: Diagnosis not present

## 2015-06-10 DIAGNOSIS — Z7901 Long term (current) use of anticoagulants: Secondary | ICD-10-CM | POA: Diagnosis not present

## 2015-06-10 DIAGNOSIS — Z471 Aftercare following joint replacement surgery: Secondary | ICD-10-CM | POA: Diagnosis not present

## 2015-06-12 DIAGNOSIS — Z7982 Long term (current) use of aspirin: Secondary | ICD-10-CM | POA: Diagnosis not present

## 2015-06-12 DIAGNOSIS — Z471 Aftercare following joint replacement surgery: Secondary | ICD-10-CM | POA: Diagnosis not present

## 2015-06-12 DIAGNOSIS — Z96652 Presence of left artificial knee joint: Secondary | ICD-10-CM | POA: Diagnosis not present

## 2015-06-12 DIAGNOSIS — Z7901 Long term (current) use of anticoagulants: Secondary | ICD-10-CM | POA: Diagnosis not present

## 2015-06-16 DIAGNOSIS — Z7901 Long term (current) use of anticoagulants: Secondary | ICD-10-CM | POA: Diagnosis not present

## 2015-06-16 DIAGNOSIS — Z96652 Presence of left artificial knee joint: Secondary | ICD-10-CM | POA: Diagnosis not present

## 2015-06-16 DIAGNOSIS — Z7982 Long term (current) use of aspirin: Secondary | ICD-10-CM | POA: Diagnosis not present

## 2015-06-16 DIAGNOSIS — Z471 Aftercare following joint replacement surgery: Secondary | ICD-10-CM | POA: Diagnosis not present

## 2015-06-19 DIAGNOSIS — Z96652 Presence of left artificial knee joint: Secondary | ICD-10-CM | POA: Diagnosis not present

## 2015-06-19 DIAGNOSIS — Z7901 Long term (current) use of anticoagulants: Secondary | ICD-10-CM | POA: Diagnosis not present

## 2015-06-19 DIAGNOSIS — Z471 Aftercare following joint replacement surgery: Secondary | ICD-10-CM | POA: Diagnosis not present

## 2015-06-19 DIAGNOSIS — Z7982 Long term (current) use of aspirin: Secondary | ICD-10-CM | POA: Diagnosis not present

## 2015-06-26 DIAGNOSIS — Z7982 Long term (current) use of aspirin: Secondary | ICD-10-CM | POA: Diagnosis not present

## 2015-06-26 DIAGNOSIS — Z471 Aftercare following joint replacement surgery: Secondary | ICD-10-CM | POA: Diagnosis not present

## 2015-06-26 DIAGNOSIS — Z7901 Long term (current) use of anticoagulants: Secondary | ICD-10-CM | POA: Diagnosis not present

## 2015-06-26 DIAGNOSIS — Z96652 Presence of left artificial knee joint: Secondary | ICD-10-CM | POA: Diagnosis not present

## 2016-03-02 DIAGNOSIS — J019 Acute sinusitis, unspecified: Secondary | ICD-10-CM | POA: Diagnosis not present

## 2016-03-30 DIAGNOSIS — R0602 Shortness of breath: Secondary | ICD-10-CM | POA: Diagnosis not present

## 2016-03-30 DIAGNOSIS — I499 Cardiac arrhythmia, unspecified: Secondary | ICD-10-CM | POA: Diagnosis not present

## 2016-03-30 DIAGNOSIS — Z0000 Encounter for general adult medical examination without abnormal findings: Secondary | ICD-10-CM | POA: Diagnosis not present

## 2016-03-30 DIAGNOSIS — E785 Hyperlipidemia, unspecified: Secondary | ICD-10-CM | POA: Diagnosis not present

## 2016-03-30 DIAGNOSIS — Z1389 Encounter for screening for other disorder: Secondary | ICD-10-CM | POA: Diagnosis not present

## 2016-03-30 DIAGNOSIS — Z9181 History of falling: Secondary | ICD-10-CM | POA: Diagnosis not present

## 2016-03-31 DIAGNOSIS — I4819 Other persistent atrial fibrillation: Secondary | ICD-10-CM | POA: Diagnosis present

## 2016-03-31 DIAGNOSIS — R0602 Shortness of breath: Secondary | ICD-10-CM | POA: Diagnosis present

## 2016-03-31 HISTORY — DX: Shortness of breath: R06.02

## 2016-03-31 HISTORY — DX: Other persistent atrial fibrillation: I48.19

## 2016-04-01 DIAGNOSIS — I499 Cardiac arrhythmia, unspecified: Secondary | ICD-10-CM | POA: Diagnosis not present

## 2016-04-01 DIAGNOSIS — I481 Persistent atrial fibrillation: Secondary | ICD-10-CM | POA: Diagnosis not present

## 2016-04-01 DIAGNOSIS — R0602 Shortness of breath: Secondary | ICD-10-CM | POA: Diagnosis not present

## 2016-04-01 DIAGNOSIS — I509 Heart failure, unspecified: Secondary | ICD-10-CM | POA: Diagnosis not present

## 2016-04-07 DIAGNOSIS — I509 Heart failure, unspecified: Secondary | ICD-10-CM | POA: Diagnosis not present

## 2016-04-07 DIAGNOSIS — I481 Persistent atrial fibrillation: Secondary | ICD-10-CM | POA: Diagnosis not present

## 2016-04-07 DIAGNOSIS — Z7901 Long term (current) use of anticoagulants: Secondary | ICD-10-CM | POA: Diagnosis not present

## 2016-04-12 DIAGNOSIS — R0602 Shortness of breath: Secondary | ICD-10-CM | POA: Diagnosis not present

## 2016-04-12 DIAGNOSIS — I509 Heart failure, unspecified: Secondary | ICD-10-CM | POA: Diagnosis not present

## 2016-04-12 DIAGNOSIS — I481 Persistent atrial fibrillation: Secondary | ICD-10-CM | POA: Diagnosis not present

## 2016-04-14 DIAGNOSIS — I481 Persistent atrial fibrillation: Secondary | ICD-10-CM | POA: Diagnosis not present

## 2016-05-13 DIAGNOSIS — Z7901 Long term (current) use of anticoagulants: Secondary | ICD-10-CM | POA: Diagnosis not present

## 2016-05-13 DIAGNOSIS — I481 Persistent atrial fibrillation: Secondary | ICD-10-CM | POA: Diagnosis not present

## 2016-05-13 DIAGNOSIS — I5032 Chronic diastolic (congestive) heart failure: Secondary | ICD-10-CM | POA: Diagnosis not present

## 2016-05-19 DIAGNOSIS — I272 Pulmonary hypertension, unspecified: Secondary | ICD-10-CM | POA: Insufficient documentation

## 2016-05-19 DIAGNOSIS — I481 Persistent atrial fibrillation: Secondary | ICD-10-CM | POA: Diagnosis not present

## 2016-05-19 HISTORY — DX: Pulmonary hypertension, unspecified: I27.20

## 2016-05-24 DIAGNOSIS — K449 Diaphragmatic hernia without obstruction or gangrene: Secondary | ICD-10-CM | POA: Diagnosis not present

## 2016-05-24 DIAGNOSIS — I272 Pulmonary hypertension, unspecified: Secondary | ICD-10-CM | POA: Diagnosis not present

## 2016-05-24 DIAGNOSIS — Z882 Allergy status to sulfonamides status: Secondary | ICD-10-CM | POA: Diagnosis not present

## 2016-05-24 DIAGNOSIS — R9431 Abnormal electrocardiogram [ECG] [EKG]: Secondary | ICD-10-CM | POA: Diagnosis not present

## 2016-05-24 DIAGNOSIS — Z79899 Other long term (current) drug therapy: Secondary | ICD-10-CM | POA: Diagnosis not present

## 2016-05-24 DIAGNOSIS — I340 Nonrheumatic mitral (valve) insufficiency: Secondary | ICD-10-CM | POA: Diagnosis not present

## 2016-05-24 DIAGNOSIS — R600 Localized edema: Secondary | ICD-10-CM | POA: Diagnosis not present

## 2016-05-24 DIAGNOSIS — Z7901 Long term (current) use of anticoagulants: Secondary | ICD-10-CM | POA: Diagnosis not present

## 2016-05-24 DIAGNOSIS — I481 Persistent atrial fibrillation: Secondary | ICD-10-CM | POA: Diagnosis not present

## 2016-05-24 DIAGNOSIS — Z87891 Personal history of nicotine dependence: Secondary | ICD-10-CM | POA: Diagnosis not present

## 2016-05-24 DIAGNOSIS — I509 Heart failure, unspecified: Secondary | ICD-10-CM | POA: Diagnosis not present

## 2016-05-24 DIAGNOSIS — I440 Atrioventricular block, first degree: Secondary | ICD-10-CM | POA: Diagnosis not present

## 2016-06-01 DIAGNOSIS — I4891 Unspecified atrial fibrillation: Secondary | ICD-10-CM | POA: Diagnosis not present

## 2016-10-06 DIAGNOSIS — C44311 Basal cell carcinoma of skin of nose: Secondary | ICD-10-CM | POA: Diagnosis not present

## 2016-10-06 DIAGNOSIS — L821 Other seborrheic keratosis: Secondary | ICD-10-CM | POA: Diagnosis not present

## 2016-10-06 DIAGNOSIS — L578 Other skin changes due to chronic exposure to nonionizing radiation: Secondary | ICD-10-CM | POA: Diagnosis not present

## 2016-10-06 DIAGNOSIS — L570 Actinic keratosis: Secondary | ICD-10-CM | POA: Diagnosis not present

## 2016-10-11 DIAGNOSIS — R0609 Other forms of dyspnea: Secondary | ICD-10-CM | POA: Diagnosis not present

## 2016-10-11 DIAGNOSIS — Z87891 Personal history of nicotine dependence: Secondary | ICD-10-CM | POA: Diagnosis not present

## 2016-10-11 DIAGNOSIS — I340 Nonrheumatic mitral (valve) insufficiency: Secondary | ICD-10-CM | POA: Diagnosis not present

## 2016-10-11 DIAGNOSIS — I5032 Chronic diastolic (congestive) heart failure: Secondary | ICD-10-CM | POA: Diagnosis not present

## 2016-10-11 DIAGNOSIS — I270 Primary pulmonary hypertension: Secondary | ICD-10-CM | POA: Diagnosis not present

## 2016-10-11 DIAGNOSIS — R69 Illness, unspecified: Secondary | ICD-10-CM | POA: Diagnosis not present

## 2016-10-11 DIAGNOSIS — R0602 Shortness of breath: Secondary | ICD-10-CM | POA: Diagnosis not present

## 2016-10-11 DIAGNOSIS — Z79899 Other long term (current) drug therapy: Secondary | ICD-10-CM | POA: Diagnosis not present

## 2016-10-11 DIAGNOSIS — I5030 Unspecified diastolic (congestive) heart failure: Secondary | ICD-10-CM | POA: Diagnosis not present

## 2016-10-11 DIAGNOSIS — Z114 Encounter for screening for human immunodeficiency virus [HIV]: Secondary | ICD-10-CM | POA: Diagnosis not present

## 2016-10-11 DIAGNOSIS — I482 Chronic atrial fibrillation: Secondary | ICD-10-CM | POA: Diagnosis not present

## 2016-10-27 DIAGNOSIS — C44311 Basal cell carcinoma of skin of nose: Secondary | ICD-10-CM | POA: Diagnosis not present

## 2016-11-08 DIAGNOSIS — I340 Nonrheumatic mitral (valve) insufficiency: Secondary | ICD-10-CM | POA: Diagnosis not present

## 2016-11-08 DIAGNOSIS — I050 Rheumatic mitral stenosis: Secondary | ICD-10-CM | POA: Diagnosis not present

## 2016-11-19 DIAGNOSIS — R69 Illness, unspecified: Secondary | ICD-10-CM | POA: Diagnosis not present

## 2016-12-06 DIAGNOSIS — I482 Chronic atrial fibrillation: Secondary | ICD-10-CM | POA: Diagnosis not present

## 2016-12-06 DIAGNOSIS — I340 Nonrheumatic mitral (valve) insufficiency: Secondary | ICD-10-CM | POA: Diagnosis not present

## 2016-12-06 DIAGNOSIS — I272 Pulmonary hypertension, unspecified: Secondary | ICD-10-CM | POA: Diagnosis not present

## 2016-12-06 DIAGNOSIS — R0609 Other forms of dyspnea: Secondary | ICD-10-CM | POA: Diagnosis not present

## 2016-12-06 DIAGNOSIS — I5032 Chronic diastolic (congestive) heart failure: Secondary | ICD-10-CM | POA: Diagnosis not present

## 2017-02-21 DIAGNOSIS — I361 Nonrheumatic tricuspid (valve) insufficiency: Secondary | ICD-10-CM | POA: Diagnosis not present

## 2017-02-21 DIAGNOSIS — G9782 Other postprocedural complications and disorders of nervous system: Secondary | ICD-10-CM | POA: Diagnosis not present

## 2017-02-21 DIAGNOSIS — Y838 Other surgical procedures as the cause of abnormal reaction of the patient, or of later complication, without mention of misadventure at the time of the procedure: Secondary | ICD-10-CM | POA: Diagnosis not present

## 2017-02-21 DIAGNOSIS — G40109 Localization-related (focal) (partial) symptomatic epilepsy and epileptic syndromes with simple partial seizures, not intractable, without status epilepticus: Secondary | ICD-10-CM | POA: Diagnosis not present

## 2017-02-21 DIAGNOSIS — E872 Acidosis: Secondary | ICD-10-CM | POA: Diagnosis not present

## 2017-02-21 DIAGNOSIS — J9383 Other pneumothorax: Secondary | ICD-10-CM | POA: Diagnosis not present

## 2017-02-21 DIAGNOSIS — Z4682 Encounter for fitting and adjustment of non-vascular catheter: Secondary | ICD-10-CM | POA: Diagnosis not present

## 2017-02-21 DIAGNOSIS — D62 Acute posthemorrhagic anemia: Secondary | ICD-10-CM | POA: Diagnosis not present

## 2017-02-21 DIAGNOSIS — J811 Chronic pulmonary edema: Secondary | ICD-10-CM | POA: Diagnosis not present

## 2017-02-21 DIAGNOSIS — Z452 Encounter for adjustment and management of vascular access device: Secondary | ICD-10-CM | POA: Diagnosis not present

## 2017-02-21 DIAGNOSIS — Z87891 Personal history of nicotine dependence: Secondary | ICD-10-CM | POA: Diagnosis not present

## 2017-02-21 DIAGNOSIS — T8111XA Postprocedural  cardiogenic shock, initial encounter: Secondary | ICD-10-CM | POA: Diagnosis not present

## 2017-02-21 DIAGNOSIS — R918 Other nonspecific abnormal finding of lung field: Secondary | ICD-10-CM | POA: Diagnosis not present

## 2017-02-21 DIAGNOSIS — G459 Transient cerebral ischemic attack, unspecified: Secondary | ICD-10-CM | POA: Diagnosis not present

## 2017-02-21 DIAGNOSIS — J95822 Acute and chronic postprocedural respiratory failure: Secondary | ICD-10-CM | POA: Diagnosis not present

## 2017-02-21 DIAGNOSIS — G8918 Other acute postprocedural pain: Secondary | ICD-10-CM | POA: Diagnosis not present

## 2017-02-21 DIAGNOSIS — I4891 Unspecified atrial fibrillation: Secondary | ICD-10-CM | POA: Diagnosis not present

## 2017-02-21 DIAGNOSIS — R29818 Other symptoms and signs involving the nervous system: Secondary | ICD-10-CM | POA: Diagnosis not present

## 2017-02-21 DIAGNOSIS — I639 Cerebral infarction, unspecified: Secondary | ICD-10-CM | POA: Diagnosis not present

## 2017-02-21 DIAGNOSIS — R569 Unspecified convulsions: Secondary | ICD-10-CM | POA: Diagnosis not present

## 2017-02-21 DIAGNOSIS — J9601 Acute respiratory failure with hypoxia: Secondary | ICD-10-CM | POA: Diagnosis not present

## 2017-02-21 DIAGNOSIS — I5032 Chronic diastolic (congestive) heart failure: Secondary | ICD-10-CM | POA: Diagnosis not present

## 2017-02-21 DIAGNOSIS — M6281 Muscle weakness (generalized): Secondary | ICD-10-CM | POA: Diagnosis not present

## 2017-02-21 DIAGNOSIS — I081 Rheumatic disorders of both mitral and tricuspid valves: Secondary | ICD-10-CM | POA: Diagnosis not present

## 2017-02-21 DIAGNOSIS — I5030 Unspecified diastolic (congestive) heart failure: Secondary | ICD-10-CM | POA: Diagnosis not present

## 2017-02-21 DIAGNOSIS — I482 Chronic atrial fibrillation: Secondary | ICD-10-CM | POA: Diagnosis not present

## 2017-02-21 DIAGNOSIS — J984 Other disorders of lung: Secondary | ICD-10-CM | POA: Diagnosis not present

## 2017-02-21 DIAGNOSIS — R0989 Other specified symptoms and signs involving the circulatory and respiratory systems: Secondary | ICD-10-CM | POA: Diagnosis not present

## 2017-02-21 DIAGNOSIS — R400 Somnolence: Secondary | ICD-10-CM | POA: Diagnosis not present

## 2017-02-21 DIAGNOSIS — I051 Rheumatic mitral insufficiency: Secondary | ICD-10-CM | POA: Diagnosis not present

## 2017-02-21 DIAGNOSIS — I272 Pulmonary hypertension, unspecified: Secondary | ICD-10-CM | POA: Diagnosis not present

## 2017-02-21 DIAGNOSIS — Z952 Presence of prosthetic heart valve: Secondary | ICD-10-CM | POA: Diagnosis not present

## 2017-02-21 DIAGNOSIS — Z9889 Other specified postprocedural states: Secondary | ICD-10-CM | POA: Diagnosis not present

## 2017-02-21 DIAGNOSIS — E875 Hyperkalemia: Secondary | ICD-10-CM | POA: Diagnosis not present

## 2017-02-21 DIAGNOSIS — I340 Nonrheumatic mitral (valve) insufficiency: Secondary | ICD-10-CM | POA: Diagnosis not present

## 2017-02-21 DIAGNOSIS — Z01810 Encounter for preprocedural cardiovascular examination: Secondary | ICD-10-CM | POA: Diagnosis not present

## 2017-02-21 DIAGNOSIS — J90 Pleural effusion, not elsewhere classified: Secondary | ICD-10-CM | POA: Diagnosis not present

## 2017-02-21 DIAGNOSIS — T81718A Complication of other artery following a procedure, not elsewhere classified, initial encounter: Secondary | ICD-10-CM | POA: Diagnosis not present

## 2017-02-21 DIAGNOSIS — R4701 Aphasia: Secondary | ICD-10-CM | POA: Diagnosis not present

## 2017-02-21 DIAGNOSIS — I2699 Other pulmonary embolism without acute cor pulmonale: Secondary | ICD-10-CM | POA: Diagnosis not present

## 2017-02-21 DIAGNOSIS — I517 Cardiomegaly: Secondary | ICD-10-CM | POA: Diagnosis not present

## 2017-02-21 DIAGNOSIS — J9811 Atelectasis: Secondary | ICD-10-CM | POA: Diagnosis not present

## 2017-02-22 DIAGNOSIS — Z9889 Other specified postprocedural states: Secondary | ICD-10-CM

## 2017-02-22 DIAGNOSIS — Z8679 Personal history of other diseases of the circulatory system: Secondary | ICD-10-CM | POA: Insufficient documentation

## 2017-02-22 HISTORY — DX: Other specified postprocedural states: Z98.890

## 2017-02-22 HISTORY — DX: Personal history of other diseases of the circulatory system: Z86.79

## 2017-02-24 DIAGNOSIS — R569 Unspecified convulsions: Secondary | ICD-10-CM

## 2017-02-24 DIAGNOSIS — G459 Transient cerebral ischemic attack, unspecified: Secondary | ICD-10-CM

## 2017-02-24 HISTORY — DX: Transient cerebral ischemic attack, unspecified: G45.9

## 2017-02-24 HISTORY — DX: Unspecified convulsions: R56.9

## 2017-03-06 DIAGNOSIS — R112 Nausea with vomiting, unspecified: Secondary | ICD-10-CM | POA: Diagnosis not present

## 2017-03-06 DIAGNOSIS — I4891 Unspecified atrial fibrillation: Secondary | ICD-10-CM | POA: Diagnosis not present

## 2017-03-06 DIAGNOSIS — E785 Hyperlipidemia, unspecified: Secondary | ICD-10-CM | POA: Diagnosis not present

## 2017-03-06 DIAGNOSIS — R918 Other nonspecific abnormal finding of lung field: Secondary | ICD-10-CM | POA: Diagnosis not present

## 2017-03-06 DIAGNOSIS — Z87891 Personal history of nicotine dependence: Secondary | ICD-10-CM | POA: Diagnosis not present

## 2017-03-06 DIAGNOSIS — G459 Transient cerebral ischemic attack, unspecified: Secondary | ICD-10-CM | POA: Diagnosis not present

## 2017-03-06 DIAGNOSIS — Z952 Presence of prosthetic heart valve: Secondary | ICD-10-CM | POA: Diagnosis not present

## 2017-03-06 DIAGNOSIS — H538 Other visual disturbances: Secondary | ICD-10-CM | POA: Diagnosis not present

## 2017-03-06 DIAGNOSIS — Z86711 Personal history of pulmonary embolism: Secondary | ICD-10-CM | POA: Diagnosis not present

## 2017-03-06 DIAGNOSIS — J90 Pleural effusion, not elsewhere classified: Secondary | ICD-10-CM | POA: Diagnosis not present

## 2017-03-06 DIAGNOSIS — Z8673 Personal history of transient ischemic attack (TIA), and cerebral infarction without residual deficits: Secondary | ICD-10-CM | POA: Diagnosis not present

## 2017-03-06 DIAGNOSIS — J9811 Atelectasis: Secondary | ICD-10-CM | POA: Diagnosis not present

## 2017-03-06 DIAGNOSIS — Z7901 Long term (current) use of anticoagulants: Secondary | ICD-10-CM | POA: Diagnosis not present

## 2017-03-06 DIAGNOSIS — D72823 Leukemoid reaction: Secondary | ICD-10-CM | POA: Diagnosis not present

## 2017-03-06 DIAGNOSIS — Z8679 Personal history of other diseases of the circulatory system: Secondary | ICD-10-CM | POA: Diagnosis not present

## 2017-03-06 DIAGNOSIS — I5032 Chronic diastolic (congestive) heart failure: Secondary | ICD-10-CM | POA: Diagnosis not present

## 2017-03-06 DIAGNOSIS — I2699 Other pulmonary embolism without acute cor pulmonale: Secondary | ICD-10-CM | POA: Diagnosis not present

## 2017-03-06 DIAGNOSIS — Z9889 Other specified postprocedural states: Secondary | ICD-10-CM | POA: Diagnosis not present

## 2017-03-06 DIAGNOSIS — J9611 Chronic respiratory failure with hypoxia: Secondary | ICD-10-CM | POA: Diagnosis not present

## 2017-03-06 DIAGNOSIS — I272 Pulmonary hypertension, unspecified: Secondary | ICD-10-CM | POA: Diagnosis not present

## 2017-03-06 DIAGNOSIS — R0602 Shortness of breath: Secondary | ICD-10-CM | POA: Diagnosis not present

## 2017-03-06 DIAGNOSIS — D696 Thrombocytopenia, unspecified: Secondary | ICD-10-CM | POA: Diagnosis not present

## 2017-03-06 DIAGNOSIS — I482 Chronic atrial fibrillation: Secondary | ICD-10-CM | POA: Diagnosis not present

## 2017-03-06 DIAGNOSIS — D7582 Heparin induced thrombocytopenia (HIT): Secondary | ICD-10-CM | POA: Diagnosis not present

## 2017-03-06 DIAGNOSIS — I4892 Unspecified atrial flutter: Secondary | ICD-10-CM | POA: Diagnosis not present

## 2017-03-06 DIAGNOSIS — I5030 Unspecified diastolic (congestive) heart failure: Secondary | ICD-10-CM | POA: Diagnosis not present

## 2017-03-06 DIAGNOSIS — G43109 Migraine with aura, not intractable, without status migrainosus: Secondary | ICD-10-CM | POA: Diagnosis not present

## 2017-03-07 DIAGNOSIS — I480 Paroxysmal atrial fibrillation: Secondary | ICD-10-CM

## 2017-03-07 HISTORY — DX: Paroxysmal atrial fibrillation: I48.0

## 2017-03-09 DIAGNOSIS — R112 Nausea with vomiting, unspecified: Secondary | ICD-10-CM | POA: Insufficient documentation

## 2017-03-09 DIAGNOSIS — H538 Other visual disturbances: Secondary | ICD-10-CM | POA: Insufficient documentation

## 2017-03-09 HISTORY — DX: Other visual disturbances: H53.8

## 2017-03-11 DIAGNOSIS — D7582 Heparin induced thrombocytopenia (HIT): Secondary | ICD-10-CM

## 2017-03-11 DIAGNOSIS — D75829 Heparin-induced thrombocytopenia, unspecified: Secondary | ICD-10-CM | POA: Insufficient documentation

## 2017-03-11 HISTORY — DX: Heparin induced thrombocytopenia (HIT): D75.82

## 2017-03-11 HISTORY — DX: Heparin-induced thrombocytopenia, unspecified: D75.829

## 2017-03-14 DIAGNOSIS — Z9889 Other specified postprocedural states: Secondary | ICD-10-CM | POA: Diagnosis not present

## 2017-03-14 DIAGNOSIS — J9811 Atelectasis: Secondary | ICD-10-CM | POA: Diagnosis not present

## 2017-03-14 DIAGNOSIS — Z8679 Personal history of other diseases of the circulatory system: Secondary | ICD-10-CM | POA: Diagnosis not present

## 2017-03-14 DIAGNOSIS — J90 Pleural effusion, not elsewhere classified: Secondary | ICD-10-CM | POA: Diagnosis not present

## 2017-03-21 DIAGNOSIS — Z7901 Long term (current) use of anticoagulants: Secondary | ICD-10-CM | POA: Diagnosis not present

## 2017-03-21 DIAGNOSIS — R791 Abnormal coagulation profile: Secondary | ICD-10-CM | POA: Diagnosis not present

## 2017-03-24 DIAGNOSIS — R0602 Shortness of breath: Secondary | ICD-10-CM | POA: Diagnosis not present

## 2017-03-24 DIAGNOSIS — Z6828 Body mass index (BMI) 28.0-28.9, adult: Secondary | ICD-10-CM | POA: Diagnosis not present

## 2017-03-25 DIAGNOSIS — I5032 Chronic diastolic (congestive) heart failure: Secondary | ICD-10-CM | POA: Diagnosis not present

## 2017-03-25 DIAGNOSIS — R0601 Orthopnea: Secondary | ICD-10-CM | POA: Diagnosis not present

## 2017-03-25 DIAGNOSIS — R319 Hematuria, unspecified: Secondary | ICD-10-CM | POA: Diagnosis not present

## 2017-03-25 DIAGNOSIS — J90 Pleural effusion, not elsewhere classified: Secondary | ICD-10-CM | POA: Diagnosis not present

## 2017-03-25 DIAGNOSIS — E440 Moderate protein-calorie malnutrition: Secondary | ICD-10-CM | POA: Diagnosis not present

## 2017-03-25 DIAGNOSIS — E871 Hypo-osmolality and hyponatremia: Secondary | ICD-10-CM | POA: Diagnosis not present

## 2017-03-25 DIAGNOSIS — I272 Pulmonary hypertension, unspecified: Secondary | ICD-10-CM | POA: Diagnosis not present

## 2017-03-25 DIAGNOSIS — E8770 Fluid overload, unspecified: Secondary | ICD-10-CM | POA: Diagnosis not present

## 2017-03-25 DIAGNOSIS — N390 Urinary tract infection, site not specified: Secondary | ICD-10-CM | POA: Diagnosis not present

## 2017-03-25 DIAGNOSIS — R310 Gross hematuria: Secondary | ICD-10-CM | POA: Diagnosis not present

## 2017-03-25 DIAGNOSIS — R110 Nausea: Secondary | ICD-10-CM | POA: Diagnosis not present

## 2017-03-25 DIAGNOSIS — R918 Other nonspecific abnormal finding of lung field: Secondary | ICD-10-CM | POA: Diagnosis not present

## 2017-03-25 DIAGNOSIS — I480 Paroxysmal atrial fibrillation: Secondary | ICD-10-CM | POA: Diagnosis not present

## 2017-03-25 DIAGNOSIS — E861 Hypovolemia: Secondary | ICD-10-CM | POA: Diagnosis not present

## 2017-03-25 DIAGNOSIS — D72829 Elevated white blood cell count, unspecified: Secondary | ICD-10-CM | POA: Diagnosis not present

## 2017-03-25 DIAGNOSIS — Z9889 Other specified postprocedural states: Secondary | ICD-10-CM | POA: Diagnosis not present

## 2017-03-25 DIAGNOSIS — E785 Hyperlipidemia, unspecified: Secondary | ICD-10-CM | POA: Diagnosis not present

## 2017-04-01 DIAGNOSIS — I499 Cardiac arrhythmia, unspecified: Secondary | ICD-10-CM | POA: Diagnosis not present

## 2017-04-01 DIAGNOSIS — Z1331 Encounter for screening for depression: Secondary | ICD-10-CM | POA: Diagnosis not present

## 2017-04-01 DIAGNOSIS — Z9181 History of falling: Secondary | ICD-10-CM | POA: Diagnosis not present

## 2017-04-01 DIAGNOSIS — Z6828 Body mass index (BMI) 28.0-28.9, adult: Secondary | ICD-10-CM | POA: Diagnosis not present

## 2017-04-07 ENCOUNTER — Other Ambulatory Visit: Payer: Self-pay

## 2017-04-07 ENCOUNTER — Encounter (HOSPITAL_COMMUNITY): Payer: Self-pay

## 2017-04-07 ENCOUNTER — Emergency Department (HOSPITAL_COMMUNITY): Payer: Medicare HMO

## 2017-04-07 ENCOUNTER — Inpatient Hospital Stay (HOSPITAL_COMMUNITY)
Admission: EM | Admit: 2017-04-07 | Discharge: 2017-04-14 | DRG: 291 | Disposition: A | Discharge status: Home / Self Care | Payer: Medicare HMO | Attending: Family Medicine | Admitting: Family Medicine

## 2017-04-07 DIAGNOSIS — I10 Essential (primary) hypertension: Secondary | ICD-10-CM | POA: Diagnosis present

## 2017-04-07 DIAGNOSIS — Z96652 Presence of left artificial knee joint: Secondary | ICD-10-CM | POA: Diagnosis present

## 2017-04-07 DIAGNOSIS — Z90710 Acquired absence of both cervix and uterus: Secondary | ICD-10-CM

## 2017-04-07 DIAGNOSIS — R918 Other nonspecific abnormal finding of lung field: Secondary | ICD-10-CM | POA: Diagnosis not present

## 2017-04-07 DIAGNOSIS — I712 Thoracic aortic aneurysm, without rupture: Secondary | ICD-10-CM | POA: Diagnosis present

## 2017-04-07 DIAGNOSIS — R7303 Prediabetes: Secondary | ICD-10-CM | POA: Diagnosis present

## 2017-04-07 DIAGNOSIS — N179 Acute kidney failure, unspecified: Secondary | ICD-10-CM | POA: Diagnosis not present

## 2017-04-07 DIAGNOSIS — R0602 Shortness of breath: Secondary | ICD-10-CM | POA: Diagnosis present

## 2017-04-07 DIAGNOSIS — Z8673 Personal history of transient ischemic attack (TIA), and cerebral infarction without residual deficits: Secondary | ICD-10-CM | POA: Diagnosis not present

## 2017-04-07 DIAGNOSIS — I4819 Other persistent atrial fibrillation: Secondary | ICD-10-CM | POA: Diagnosis present

## 2017-04-07 DIAGNOSIS — Z86711 Personal history of pulmonary embolism: Secondary | ICD-10-CM | POA: Diagnosis present

## 2017-04-07 DIAGNOSIS — I481 Persistent atrial fibrillation: Secondary | ICD-10-CM | POA: Diagnosis not present

## 2017-04-07 DIAGNOSIS — R0600 Dyspnea, unspecified: Secondary | ICD-10-CM

## 2017-04-07 DIAGNOSIS — I5023 Acute on chronic systolic (congestive) heart failure: Secondary | ICD-10-CM | POA: Diagnosis not present

## 2017-04-07 DIAGNOSIS — Z9049 Acquired absence of other specified parts of digestive tract: Secondary | ICD-10-CM

## 2017-04-07 DIAGNOSIS — Z809 Family history of malignant neoplasm, unspecified: Secondary | ICD-10-CM

## 2017-04-07 DIAGNOSIS — J9601 Acute respiratory failure with hypoxia: Secondary | ICD-10-CM | POA: Diagnosis present

## 2017-04-07 DIAGNOSIS — J90 Pleural effusion, not elsewhere classified: Secondary | ICD-10-CM | POA: Diagnosis not present

## 2017-04-07 DIAGNOSIS — I7121 Aneurysm of the ascending aorta, without rupture: Secondary | ICD-10-CM | POA: Diagnosis present

## 2017-04-07 DIAGNOSIS — I5033 Acute on chronic diastolic (congestive) heart failure: Secondary | ICD-10-CM | POA: Diagnosis not present

## 2017-04-07 DIAGNOSIS — Z8249 Family history of ischemic heart disease and other diseases of the circulatory system: Secondary | ICD-10-CM

## 2017-04-07 DIAGNOSIS — E876 Hypokalemia: Secondary | ICD-10-CM | POA: Diagnosis not present

## 2017-04-07 DIAGNOSIS — Z952 Presence of prosthetic heart valve: Secondary | ICD-10-CM

## 2017-04-07 DIAGNOSIS — I110 Hypertensive heart disease with heart failure: Secondary | ICD-10-CM | POA: Diagnosis not present

## 2017-04-07 DIAGNOSIS — Z7901 Long term (current) use of anticoagulants: Secondary | ICD-10-CM

## 2017-04-07 DIAGNOSIS — I480 Paroxysmal atrial fibrillation: Secondary | ICD-10-CM | POA: Diagnosis not present

## 2017-04-07 DIAGNOSIS — E785 Hyperlipidemia, unspecified: Secondary | ICD-10-CM | POA: Diagnosis present

## 2017-04-07 DIAGNOSIS — J189 Pneumonia, unspecified organism: Secondary | ICD-10-CM | POA: Diagnosis not present

## 2017-04-07 DIAGNOSIS — R569 Unspecified convulsions: Secondary | ICD-10-CM | POA: Diagnosis not present

## 2017-04-07 DIAGNOSIS — R0902 Hypoxemia: Secondary | ICD-10-CM | POA: Diagnosis present

## 2017-04-07 DIAGNOSIS — I5043 Acute on chronic combined systolic (congestive) and diastolic (congestive) heart failure: Secondary | ICD-10-CM | POA: Diagnosis present

## 2017-04-07 DIAGNOSIS — G4089 Other seizures: Secondary | ICD-10-CM | POA: Diagnosis present

## 2017-04-07 DIAGNOSIS — G4700 Insomnia, unspecified: Secondary | ICD-10-CM | POA: Diagnosis present

## 2017-04-07 DIAGNOSIS — Z9889 Other specified postprocedural states: Secondary | ICD-10-CM | POA: Diagnosis not present

## 2017-04-07 DIAGNOSIS — M1990 Unspecified osteoarthritis, unspecified site: Secondary | ICD-10-CM | POA: Diagnosis present

## 2017-04-07 DIAGNOSIS — I4891 Unspecified atrial fibrillation: Secondary | ICD-10-CM | POA: Diagnosis present

## 2017-04-07 DIAGNOSIS — Z9981 Dependence on supplemental oxygen: Secondary | ICD-10-CM

## 2017-04-07 DIAGNOSIS — Y95 Nosocomial condition: Secondary | ICD-10-CM | POA: Diagnosis present

## 2017-04-07 DIAGNOSIS — Z7982 Long term (current) use of aspirin: Secondary | ICD-10-CM

## 2017-04-07 DIAGNOSIS — Z888 Allergy status to other drugs, medicaments and biological substances status: Secondary | ICD-10-CM

## 2017-04-07 DIAGNOSIS — Z885 Allergy status to narcotic agent status: Secondary | ICD-10-CM

## 2017-04-07 DIAGNOSIS — J918 Pleural effusion in other conditions classified elsewhere: Secondary | ICD-10-CM | POA: Diagnosis present

## 2017-04-07 DIAGNOSIS — I509 Heart failure, unspecified: Secondary | ICD-10-CM

## 2017-04-07 DIAGNOSIS — Z882 Allergy status to sulfonamides status: Secondary | ICD-10-CM

## 2017-04-07 LAB — BASIC METABOLIC PANEL
Anion gap: 14 (ref 5–15)
BUN: 15 mg/dL (ref 6–20)
CHLORIDE: 90 mmol/L — AB (ref 101–111)
CO2: 31 mmol/L (ref 22–32)
CREATININE: 1.05 mg/dL — AB (ref 0.44–1.00)
Calcium: 9.3 mg/dL (ref 8.9–10.3)
GFR, EST AFRICAN AMERICAN: 59 mL/min — AB (ref >60)
GFR, EST NON AFRICAN AMERICAN: 51 mL/min — AB (ref >60)
Glucose, Bld: 125 mg/dL — ABNORMAL HIGH (ref 65–99)
Potassium: 3.6 mmol/L (ref 3.5–5.1)
SODIUM: 135 mmol/L (ref 135–145)

## 2017-04-07 LAB — I-STAT TROPONIN, ED: TROPONIN I, POC: 0.01 ng/mL (ref 0.00–0.08)

## 2017-04-07 LAB — CBC
HCT: 38.2 % (ref 36.0–46.0)
Hemoglobin: 12.1 g/dL (ref 12.0–15.0)
MCH: 27.9 pg (ref 26.0–34.0)
MCHC: 31.7 g/dL (ref 30.0–36.0)
MCV: 88.2 fL (ref 78.0–100.0)
PLATELETS: 366 10*3/uL (ref 150–400)
RBC: 4.33 MIL/uL (ref 3.87–5.11)
RDW: 15.3 % (ref 11.5–15.5)
WBC: 16.7 10*3/uL — AB (ref 4.0–10.5)

## 2017-04-07 LAB — I-STAT CG4 LACTIC ACID, ED: Lactic Acid, Venous: 0.95 mmol/L (ref 0.5–1.9)

## 2017-04-07 LAB — BRAIN NATRIURETIC PEPTIDE: B NATRIURETIC PEPTIDE 5: 210.3 pg/mL — AB (ref 0.0–100.0)

## 2017-04-07 MED ORDER — CEFEPIME HCL 1 G IJ SOLR
1.0000 g | Freq: Once | INTRAMUSCULAR | Status: AC
  Administered 2017-04-08: 1 g via INTRAVENOUS
  Filled 2017-04-07: qty 1

## 2017-04-07 MED ORDER — FUROSEMIDE 10 MG/ML IJ SOLN
80.0000 mg | Freq: Once | INTRAMUSCULAR | Status: AC
  Administered 2017-04-08: 80 mg via INTRAVENOUS
  Filled 2017-04-07 (×2): qty 8

## 2017-04-07 MED ORDER — VANCOMYCIN HCL 10 G IV SOLR
1250.0000 mg | Freq: Once | INTRAVENOUS | Status: AC
  Administered 2017-04-08: 1250 mg via INTRAVENOUS
  Filled 2017-04-07: qty 1250

## 2017-04-07 NOTE — ED Provider Notes (Addendum)
[facility] EMERGENCY DEPARTMENT Provider Note   CSN: [id number] Arrival date & time: 04/07/17  1633     History   Chief Complaint Chief Complaint  Patient presents with  . Shortness of Breath    HPI Marissa Ponce is a 77 y.o. female.  HPI   77 year old female with significant history including CHF, prior PE currently on Eliquis, recent heart surgery presents with complaints of shortness of breath.  Last month, patient had a mitral valve and tricuspid valve repair.  Afterwards she developed complications including stroke and PE and CHF. Pt was treated with Eliquis and torsemide.  Since the surgery she has had recurrent shortness of breath.  Shortness of breath is related to exertion on 4 pillows or sleep in the recliner.  She cannot lay down without get breath.  Even a short walk she gets out of breath.  He has been seen in the hospital several times since.  She was seen by her PCP yesterday for shortness of breath after developing worsening shortness of breath for the past 2 days.  Obtain a chest x-ray that shows fluid lung and patient was recommended to come to the ER for further evaluation.  She denies any associated fever, productive cough, headache, chest pain, abdominal pain, bowel bladder changes, or leg swelling.  She has been compliant with her medication.  She felt she is not urinating as much as she anticipate even being on her torsemide.  She she is not on home oxygen.    Past Medical History:  Diagnosis Date  . Arthritis   . Complication of anesthesia   . Dysrhythmia    with caffine, "PVC's"  . Migraines     history of none recent  . PONV (postoperative nausea and vomiting)   . Shortness of breath dyspnea    with exertion    Patient Active Problem List   Diagnosis Date Noted  . S/P total knee replacement using cement 05/16/2015    Past Surgical History:  Procedure Laterality Date  . ABDOMINAL HYSTERECTOMY    . ANTERIOR AND POSTERIOR  REPAIR    . CARDIAC SURGERY     micuspid and tricuspid valve in 02/22/17  . CHOLECYSTECTOMY    . COLONOSCOPY    . ESOPHAGEAL DILATION    . HERNIA REPAIR Left    Ingunial  . ROTATOR CUFF REPAIR Right 2010ish  . TOTAL KNEE ARTHROPLASTY Left 05/16/2015   Procedure: LEFT TOTAL KNEE ARTHROPLASTY;  Surgeon: Netta Cedars, MD;  Location: [facility];  Service: Orthopedics;  Laterality: Left;    OB History    No data available       Home Medications    Prior to Admission medications   Medication Sig Start Date End Date Taking? Authorizing Provider  aspirin EC 81 MG tablet Take 81 mg by mouth every other day.    [provider]  b complex vitamins tablet Take 1 tablet by mouth daily.    [provider]  HYDROcodone-acetaminophen (NORCO) 5-325 MG tablet Take 1-2 tablets by mouth every 6 (six) hours as needed for moderate pain. 05/16/15   Netta Cedars, MD  methocarbamol (ROBAXIN) 500 MG tablet Take 1 tablet (500 mg total) by mouth 3 (three) times daily as needed. 05/16/15   Netta Cedars, MD  naproxen sodium (ANAPROX) 220 MG tablet Take 220 mg by mouth 2 (two) times daily with a meal.    [provider]  traMADol (ULTRAM) 50 MG tablet Take 50 mg  by mouth every 6 (six) hours as needed for moderate pain.    [provider]  warfarin (COUMADIN) 5 MG tablet Take 1 tablet (5 mg total) by mouth daily. Take as directed per the pharmacist for INR target from 2.5-3.0 for 30 days post op 05/16/15   Netta Cedars, MD    Family History History reviewed. No pertinent family history.  Social History Social History   Tobacco Use  . Smoking status: Former Smoker    Years: 8.00  . Tobacco comment: quit age 75  Substance Use Topics  . Alcohol use: No  . Drug use: No     Allergies   Heparin; Lovenox [enoxaparin sodium]; Percocet [oxycodone-acetaminophen]; Prednisone; and Sulfonamide derivatives   Review of Systems Review of Systems  All other systems reviewed and are  negative.    Physical Exam Updated Vital Signs BP 119/65   Pulse 99   Temp 97.7 F (36.5 C) (Oral)   Resp 16   Ht 5' 5.5" (1.664 m)   Wt 72.6 kg (160 lb)   SpO2 (!) 88%   BMI 26.22 kg/m   Physical Exam  Constitutional: She appears well-developed and well-nourished. She appears distressed (Elderly female nontoxic in appearance, however in mild respiratory discomfort.).  HENT:  Head: Atraumatic.  Eyes: Conjunctivae are normal.  Neck: Neck supple. No JVD present.  Cardiovascular: Exam reveals no gallop.  Murmur heard. Tachycardia with systolic heart murmur  Pulmonary/Chest: Accessory muscle usage present. No stridor. Tachypnea noted. She has decreased breath sounds. She has no wheezes. She has rales. She exhibits no tenderness.  Abdominal: Soft. There is no tenderness.  Musculoskeletal:       Right lower leg: She exhibits no edema.       Left lower leg: She exhibits no edema.  Neurological: She is alert.  Skin: Capillary refill takes less than 2 seconds. No rash noted.  Psychiatric: She has a normal mood and affect.  Nursing note and vitals reviewed.    ED Treatments / Results  Labs (all labs ordered are listed, but only abnormal results are displayed) Labs Reviewed  BASIC METABOLIC PANEL - Abnormal; Notable for the following components:      Result Value   Chloride 90 (*)    Glucose, Bld 125 (*)    Creatinine, Ser 1.05 (*)    GFR calc non Af Amer 51 (*)    GFR calc Af Amer 59 (*)    All other components within normal limits  CBC - Abnormal; Notable for the following components:   WBC 16.7 (*)    All other components within normal limits  BRAIN NATRIURETIC PEPTIDE - Abnormal; Notable for the following components:   B Natriuretic Peptide 210.3 (*)    All other components within normal limits  PROTIME-INR - Abnormal; Notable for the following components:   Prothrombin Time 25.8 (*)    All other components within normal limits  CULTURE, BLOOD (ROUTINE X 2)    CULTURE, BLOOD (ROUTINE X 2)  TROPONIN I  TROPONIN I  TROPONIN I  I-STAT TROPONIN, ED  I-STAT CG4 LACTIC ACID, ED    EKG  EKG Interpretation None      Date: 04/08/2017  Rate: 103  Rhythm: sinus tachycardia  QRS Axis: normal  Intervals: normal  ST/T Wave abnormalities: normal  Conduction Disutrbances: none  Narrative Interpretation:   Old EKG Reviewed: No significant changes noted     Radiology Dg Chest 2 View  Result Date: 04/07/2017 CLINICAL DATA:  Shortness of  breath for 3 days EXAM: CHEST  2 VIEW COMPARISON:  08/28/2004 FINDINGS: Chronic cardiomegaly. Mitral and tricuspid valve repair. Enlarged main pulmonary artery. Low volume chest with small pleural effusions. There is generalized interstitial coarsening with fissure thickening. Lower lung opacities which could be atelectasis or infection. IMPRESSION: 1. Probable CHF. 2. Lower lung opacities could be atelectasis or infection. Electronically Signed   By: Monte Fantasia M.D.   On: 04/07/2017 17:56    Procedures Procedures (including critical care time)  Medications Ordered in ED Medications  furosemide (LASIX) injection 80 mg (not administered)  ceFEPIme (MAXIPIME) 1 g in sodium chloride 0.9 % 100 mL IVPB (not administered)  vancomycin (VANCOCIN) 1,250 mg in sodium chloride 0.9 % 250 mL IVPB (1,250 mg Intravenous New Bag/Given 04/08/17 0013)  ondansetron (ZOFRAN) injection 4 mg (not administered)  hydrALAZINE (APRESOLINE) injection 5 mg (not administered)  acetaminophen (TYLENOL) tablet 650 mg (not administered)  zolpidem (AMBIEN) tablet 5 mg (not administered)  apixaban (ELIQUIS) tablet 5 mg (not administered)  diltiazem (DILACOR XR) 24 hr capsule 120 mg (not administered)  levETIRAcetam (KEPPRA) tablet 500 mg (not administered)     Initial Impression / Assessment and Plan / ED Course  I have reviewed the triage vital signs and the nursing notes.  Pertinent labs & imaging results that were available during  my care of the patient were reviewed by me and considered in my medical decision making (see chart for details).     BP 110/61   Pulse 99   Temp 98.9 F (37.2 C) (Rectal)   Resp (!) 33   Ht 5' 5.5" (1.664 m)   Wt 73.5 kg (162 lb)   SpO2 (!) 86%   BMI 26.55 kg/m    Final Clinical Impressions(s) / ED Diagnoses   Final diagnoses:  Acute on chronic systolic congestive heart failure (HCC)  HCAP (healthcare-associated pneumonia)    ED Discharge Orders    None     Patient presents with worsening shortness of breath since her heart valvular replacement a month ago.  Symptoms suggestive of CHF exacerbation as her symptoms worsen when laying and with exertion.  She has a 4 pillow orthopnea.  Her chest x-ray shows probable CHF with lower lung opacities that could be atelectasis or infection.  She also has an elevated white count of 16.7.  Due to recent immunization, will start patient on antibiotic to cover for potential H.  Antibiotics include cefepime and vancomycin.  BNP is 210.  She does have rales on her lungs exam but no JVD or peripheral edema.  She is on Eliquis low suspicion for PE.  She does have oxygen requirement with an O2 of 86% on room air, improved to 95% with 2 L of oxygen.  Patient given Lasix 80 mg via IV.  I have discussed care with attending, Dr. Venora Maples.  Plan to have patient admitted for further management of her CHF exacerbation with new oxygen requirement.  1:07 AM Appreciate consultation from [facility] Dr. Blaine Hamper who agrees to see and admit pt for further management of her HCAP and CHF exacerbation.     Domenic Moras, PA-C 04/08/17 0107    Jola Schmidt, MD 04/08/17 0759  ADDENDUM on 04/20/2017 CRITICAL CARE Performed by: Domenic Moras Total critical care time: 35 minutes Critical care time was exclusive of separately billable procedures and treating other patients. Critical care was necessary to treat or prevent imminent or life-threatening  deterioration. Critical care was time spent personally by me on the following  activities: development of treatment plan with patient and/or surrogate as well as nursing, discussions with consultants, evaluation of patient's response to treatment, examination of patient, obtaining history from patient or surrogate, ordering and performing treatments and interventions, ordering and review of laboratory studies, ordering and review of radiographic studies, pulse oximetry and re-evaluation of patient's condition.    Domenic Moras, PA-C 04/20/17 [place], MD 04/23/17 2300

## 2017-04-07 NOTE — ED Provider Notes (Signed)
Patient placed in Quick Look pathway, seen and evaluated   Chief Complaint: shortness of breath  HPI:   77 y.o. female with hx of A fib and CHF presents to the ED with c/o shortness of breath that started 3 days ago and has gotten worse. Patient with hx of cardiac problems and has had ablation and been cardioverted. Patient went to her PCP today and was told to come to the ED. Patient on home O2.   ROS: Resp: shortness of breath  Physical Exam:   BP 116/72 (BP Location: Left Arm)   Pulse (!) 103   Temp 97.7 F (36.5 C) (Oral)   Resp 16   Ht 5' 5.5" (1.664 m)   Wt 72.6 kg (160 lb)   SpO2 92%   BMI 26.22 kg/m    Gen: No distress  Neuro: Awake and Alert  Skin: Warm and dry  Resp: right lung with rales and decreased   breath sounds.    Focused Exam:    Initiation of care has begun. The patient has been counseled on the process, plan, and necessity for staying for the completion/evaluation, and the remainder of the medical screening examination    Ashley Murrain, NP 04/07/17 1703    Daleen Bo, MD 04/08/17 [phone]

## 2017-04-07 NOTE — ED Triage Notes (Signed)
Pt sent by Dr for shob x 3 days and was told after having a chest x-ray that "my lungs are full of fluid" Able to speak in complete sentences at this time. Has been taking 80mg  torsemide. Pt has heart surgery 02/22/17 and a catheterization.

## 2017-04-08 ENCOUNTER — Other Ambulatory Visit: Payer: Self-pay

## 2017-04-08 ENCOUNTER — Other Ambulatory Visit (HOSPITAL_COMMUNITY): Payer: Medicare HMO

## 2017-04-08 ENCOUNTER — Encounter (HOSPITAL_COMMUNITY): Payer: Self-pay | Admitting: Cardiology

## 2017-04-08 DIAGNOSIS — Z952 Presence of prosthetic heart valve: Secondary | ICD-10-CM | POA: Diagnosis not present

## 2017-04-08 DIAGNOSIS — R0602 Shortness of breath: Secondary | ICD-10-CM | POA: Diagnosis not present

## 2017-04-08 DIAGNOSIS — I5023 Acute on chronic systolic (congestive) heart failure: Secondary | ICD-10-CM | POA: Diagnosis not present

## 2017-04-08 DIAGNOSIS — Z882 Allergy status to sulfonamides status: Secondary | ICD-10-CM | POA: Diagnosis not present

## 2017-04-08 DIAGNOSIS — M1990 Unspecified osteoarthritis, unspecified site: Secondary | ICD-10-CM | POA: Diagnosis present

## 2017-04-08 DIAGNOSIS — Z90710 Acquired absence of both cervix and uterus: Secondary | ICD-10-CM | POA: Diagnosis not present

## 2017-04-08 DIAGNOSIS — Z9981 Dependence on supplemental oxygen: Secondary | ICD-10-CM | POA: Diagnosis not present

## 2017-04-08 DIAGNOSIS — N179 Acute kidney failure, unspecified: Secondary | ICD-10-CM | POA: Diagnosis not present

## 2017-04-08 DIAGNOSIS — E785 Hyperlipidemia, unspecified: Secondary | ICD-10-CM | POA: Insufficient documentation

## 2017-04-08 DIAGNOSIS — I509 Heart failure, unspecified: Secondary | ICD-10-CM

## 2017-04-08 DIAGNOSIS — I712 Thoracic aortic aneurysm, without rupture: Secondary | ICD-10-CM | POA: Diagnosis not present

## 2017-04-08 DIAGNOSIS — Z8673 Personal history of transient ischemic attack (TIA), and cerebral infarction without residual deficits: Secondary | ICD-10-CM

## 2017-04-08 DIAGNOSIS — Z9049 Acquired absence of other specified parts of digestive tract: Secondary | ICD-10-CM | POA: Diagnosis not present

## 2017-04-08 DIAGNOSIS — I10 Essential (primary) hypertension: Secondary | ICD-10-CM | POA: Diagnosis not present

## 2017-04-08 DIAGNOSIS — I5043 Acute on chronic combined systolic (congestive) and diastolic (congestive) heart failure: Secondary | ICD-10-CM | POA: Diagnosis not present

## 2017-04-08 DIAGNOSIS — Z888 Allergy status to other drugs, medicaments and biological substances status: Secondary | ICD-10-CM | POA: Diagnosis not present

## 2017-04-08 DIAGNOSIS — J9601 Acute respiratory failure with hypoxia: Secondary | ICD-10-CM | POA: Diagnosis not present

## 2017-04-08 DIAGNOSIS — R569 Unspecified convulsions: Secondary | ICD-10-CM

## 2017-04-08 DIAGNOSIS — I110 Hypertensive heart disease with heart failure: Secondary | ICD-10-CM | POA: Diagnosis not present

## 2017-04-08 DIAGNOSIS — J189 Pneumonia, unspecified organism: Secondary | ICD-10-CM | POA: Diagnosis not present

## 2017-04-08 DIAGNOSIS — G4700 Insomnia, unspecified: Secondary | ICD-10-CM | POA: Diagnosis present

## 2017-04-08 DIAGNOSIS — I5033 Acute on chronic diastolic (congestive) heart failure: Secondary | ICD-10-CM

## 2017-04-08 DIAGNOSIS — J918 Pleural effusion in other conditions classified elsewhere: Secondary | ICD-10-CM | POA: Diagnosis not present

## 2017-04-08 DIAGNOSIS — I480 Paroxysmal atrial fibrillation: Secondary | ICD-10-CM | POA: Diagnosis not present

## 2017-04-08 DIAGNOSIS — Z7901 Long term (current) use of anticoagulants: Secondary | ICD-10-CM | POA: Diagnosis not present

## 2017-04-08 DIAGNOSIS — J90 Pleural effusion, not elsewhere classified: Secondary | ICD-10-CM

## 2017-04-08 DIAGNOSIS — Z7982 Long term (current) use of aspirin: Secondary | ICD-10-CM | POA: Diagnosis not present

## 2017-04-08 DIAGNOSIS — J181 Lobar pneumonia, unspecified organism: Secondary | ICD-10-CM | POA: Diagnosis not present

## 2017-04-08 DIAGNOSIS — Z885 Allergy status to narcotic agent status: Secondary | ICD-10-CM | POA: Diagnosis not present

## 2017-04-08 DIAGNOSIS — R0902 Hypoxemia: Secondary | ICD-10-CM | POA: Diagnosis present

## 2017-04-08 DIAGNOSIS — I4891 Unspecified atrial fibrillation: Secondary | ICD-10-CM | POA: Diagnosis present

## 2017-04-08 DIAGNOSIS — Z9889 Other specified postprocedural states: Secondary | ICD-10-CM | POA: Diagnosis not present

## 2017-04-08 DIAGNOSIS — Z86711 Personal history of pulmonary embolism: Secondary | ICD-10-CM | POA: Diagnosis not present

## 2017-04-08 DIAGNOSIS — R918 Other nonspecific abnormal finding of lung field: Secondary | ICD-10-CM | POA: Diagnosis not present

## 2017-04-08 DIAGNOSIS — I481 Persistent atrial fibrillation: Secondary | ICD-10-CM | POA: Diagnosis not present

## 2017-04-08 DIAGNOSIS — G4089 Other seizures: Secondary | ICD-10-CM | POA: Diagnosis not present

## 2017-04-08 DIAGNOSIS — Y95 Nosocomial condition: Secondary | ICD-10-CM | POA: Diagnosis not present

## 2017-04-08 DIAGNOSIS — Z96652 Presence of left artificial knee joint: Secondary | ICD-10-CM | POA: Diagnosis present

## 2017-04-08 HISTORY — DX: Personal history of pulmonary embolism: Z86.711

## 2017-04-08 HISTORY — DX: Hyperlipidemia, unspecified: E78.5

## 2017-04-08 HISTORY — DX: Personal history of transient ischemic attack (TIA), and cerebral infarction without residual deficits: Z86.73

## 2017-04-08 LAB — TROPONIN I: Troponin I: <0.03 ng/mL (ref <0.03)

## 2017-04-08 LAB — PROTIME-INR
INR: 2.38
PROTHROMBIN TIME: 25.8 s — AB (ref 11.4–15.2)

## 2017-04-08 MED ORDER — DILTIAZEM HCL ER COATED BEADS 120 MG PO CP24
120.0000 mg | ORAL_CAPSULE | Freq: Every day | ORAL | Status: DC
  Administered 2017-04-08 – 2017-04-14 (×7): 120 mg via ORAL
  Filled 2017-04-08 (×7): qty 1

## 2017-04-08 MED ORDER — ACETAMINOPHEN 650 MG RE SUPP: 650.0000 mg | Freq: Four times a day (QID) | RECTAL | Status: DC | PRN

## 2017-04-08 MED ORDER — ACETAMINOPHEN 325 MG PO TABS: 650.0000 mg | ORAL_TABLET | Freq: Four times a day (QID) | ORAL | Status: DC | PRN

## 2017-04-08 MED ORDER — ZOLPIDEM TARTRATE 5 MG PO TABS
5.0000 mg | ORAL_TABLET | Freq: Every evening | ORAL | Status: DC | PRN
  Filled 2017-04-08: qty 1

## 2017-04-08 MED ORDER — ONDANSETRON HCL 4 MG/2ML IJ SOLN
4.0000 mg | Freq: Three times a day (TID) | INTRAMUSCULAR | Status: DC | PRN
  Administered 2017-04-08 – 2017-04-11 (×2): 4 mg via INTRAVENOUS
  Filled 2017-04-08 (×2): qty 2

## 2017-04-08 MED ORDER — LEVETIRACETAM 500 MG PO TABS
500.0000 mg | ORAL_TABLET | Freq: Every day | ORAL | Status: DC
  Administered 2017-04-08 – 2017-04-14 (×7): 500 mg via ORAL
  Filled 2017-04-08 (×7): qty 1

## 2017-04-08 MED ORDER — FUROSEMIDE 10 MG/ML IJ SOLN
20.0000 mg | Freq: Once | INTRAMUSCULAR | Status: AC
  Administered 2017-04-08: 20 mg via INTRAVENOUS
  Filled 2017-04-08: qty 2

## 2017-04-08 MED ORDER — PROMETHAZINE HCL 25 MG/ML IJ SOLN
12.5000 mg | Freq: Four times a day (QID) | INTRAMUSCULAR | Status: DC | PRN
  Administered 2017-04-08: 12.5 mg via INTRAVENOUS
  Filled 2017-04-08: qty 1

## 2017-04-08 MED ORDER — HYDRALAZINE HCL 20 MG/ML IJ SOLN: 5.0000 mg | INTRAMUSCULAR | Status: DC | PRN

## 2017-04-08 MED ORDER — POTASSIUM CHLORIDE CRYS ER 20 MEQ PO TBCR
20.0000 meq | EXTENDED_RELEASE_TABLET | Freq: Two times a day (BID) | ORAL | Status: DC
  Administered 2017-04-08 – 2017-04-14 (×13): 20 meq via ORAL
  Filled 2017-04-08 (×13): qty 1

## 2017-04-08 MED ORDER — FUROSEMIDE 10 MG/ML IJ SOLN
60.0000 mg | Freq: Three times a day (TID) | INTRAMUSCULAR | Status: AC
  Administered 2017-04-08 (×2): 60 mg via INTRAVENOUS
  Filled 2017-04-08 (×2): qty 6

## 2017-04-08 MED ORDER — APIXABAN 5 MG PO TABS
5.0000 mg | ORAL_TABLET | Freq: Two times a day (BID) | ORAL | Status: DC
  Administered 2017-04-08 (×2): 5 mg via ORAL
  Filled 2017-04-08 (×3): qty 1

## 2017-04-08 MED ORDER — FUROSEMIDE 10 MG/ML IJ SOLN
40.0000 mg | Freq: Two times a day (BID) | INTRAMUSCULAR | Status: DC
  Administered 2017-04-08: 40 mg via INTRAVENOUS
  Filled 2017-04-08: qty 4

## 2017-04-08 MED ORDER — POLYETHYLENE GLYCOL 3350 17 G PO PACK: 17.0000 g | PACK | Freq: Every day | ORAL | Status: DC | PRN

## 2017-04-08 NOTE — Care Management Note (Signed)
Case Management Note  Patient Details  Name: MAXYNE DEROCHER MRN: [id number] Date of Birth: Jun 20, 1940  Subjective/Objective:   CHF                Action/Plan: Patient lives at home with spouse; PCP: Ronita Hipps, MD; has private insurance with [employer] with prescription drug coverage; pharmacy of choice is [facility]; patient states that she no longer drives, her spouse and daughter assist her as needed. Offered [place] services, patient refused stated " I don't think that I need that."No DME at this time; CM will continue to follow for progression of care.   Expected Discharge Date:    possibly 04/11/2017              Expected Discharge Plan:  Home/Self Care  Discharge planning Services  CM Consult  Status of Service:  In process, will continue to follow  Sherrilyn Rist [phone] 04/08/2017, 4:08 PM

## 2017-04-08 NOTE — Consult Note (Addendum)
Cardiology Consultation:   Patient ID: Marissa Ponce; [id number]; [birth year]   Admit date: 04/07/2017 Date of Consult: 04/08/2017  Primary Care Provider: Ronita Hipps, MD Primary Cardiologist: Richrd Humbles, MD  Primary Electrophysiologist:  NA   Patient Profile:   Marissa Ponce is a 77 y.o. female with a hx of PE on Eliquis, persistent a fib with DCCV 03/28/17, Maze 02/22/17, S/p MV and TV ring repair 02/22/17, post op TIA with focal seizure and HIT who is being seen today for the evaluation of SOB at the request of Dr. Evangeline Gula.  History of Present Illness:   Marissa Ponce has  a hx of PE on Eliquis 02/2017, persistent a fib with DCCV 03/28/17, Maze 02/22/17, S/p MV and TV ring repair 02/22/17, post op TIA with focal seizure and HIT.   Also cardiac cath 02/21/17 with normal CO and elevated PVR, minimal LAD disease and normal RCA and LCX- at [facility].  MV repair with ring and TR repair with ring and MAZE by Dr. Cheree Ditto at [facility].   She was recently hospitalized at [facility] 03/25/17 until 03/29/17 for SOB and orthopnea, nausea and wt loss was thought to be retaining fluid due to a fib and her chronic diastolic HF.  She has had hx of BB intolerance.   Her A fib was loaded with amiodarone and she was cardioverted as above but prior to discharge did not wish to continue on amiodarone due to side effects.   She was then placed on diltiazem .  Again on eliquis for PE and a fib.   Echo 03/25/17 with elevated LA pressures with diastolic dysfunction, moderate RV systolic dysfunction, trivial PR, TR, MV with ringsmall pericardial effusion.  Cardiac meds, d/c Eliquis 5 mg BID, diltiazem 120 mg daily, lisinopril 2.5 mg daily, K+ 40 meq daily, torsemide 40 mg BID.   Was to have seen Her cardiologist Dr. Daine Gravel on the 25th.   Pt presented yesterday afternoon to ER - sent by her MD for SOB for 3 days and was told her lungs were full of fluid.  She did have 4 pillow orthopnea, + CHF on CXR.  Her SP02 was 86% on RA.   She was  so SOB she thought she would pass out or die a couple of times.  She feels mildly better but not much.  She is frustrated with 2 post hospital admits.  She has had brief episode of tachycardia that was regular since previous discharge.   Continues with no appetite.  No edema Just SOB.     EKG I personally reviewed SR with 1st degree AV block with PR 204 ms, otherwise normal EKG.  Tele I personally reviewed.  SR with non conducted PACs   Troponin 0.01, <0.03 X 3 BNP 210 Na 135, K+ 3.6, Cr 1.05 WBC 16.7, Hgb 12.1, Hct 38.2 and plts 366 INR 2.38 Blood cultures pending  2V CXR 1. Probable CHF. 2. Lower lung opacities could be atelectasis or infection  Past Medical History:  Diagnosis Date  . Arthritis   . Complication of anesthesia   . Dysrhythmia    with caffine, "PVC's"  . Migraines     history of none recent  . PONV (postoperative nausea and vomiting)   . Shortness of breath dyspnea    with exertion    Past Surgical History:  Procedure Laterality Date  . ABDOMINAL HYSTERECTOMY    . ANTERIOR AND POSTERIOR REPAIR    . CARDIAC SURGERY     micuspid and tricuspid  valve in 02/22/17  . CHOLECYSTECTOMY    . COLONOSCOPY    . ESOPHAGEAL DILATION    . HERNIA REPAIR Left    Ingunial  . ROTATOR CUFF REPAIR Right 2010ish  . TOTAL KNEE ARTHROPLASTY Left 05/16/2015   Procedure: LEFT TOTAL KNEE ARTHROPLASTY;  Surgeon: Netta Cedars, MD;  Location: [place];  Service: Orthopedics;  Laterality: Left;     Home Medications:  Prior to Admission medications   Medication Sig Start Date End Date Taking? Authorizing Provider  apixaban (ELIQUIS) 5 MG TABS tablet Take 5 mg by mouth 2 (two) times daily.   Yes [provider]  diltiazem (DILACOR XR) 120 MG 24 hr capsule Take 120 mg by mouth daily.   Yes [provider]  levETIRAcetam (KEPPRA) 500 MG tablet Take 500 mg by mouth daily.   Yes [provider]  potassium chloride (K-DUR,KLOR-CON) 10 MEQ tablet Take 10 mEq by  mouth 2 (two) times daily.   Yes [provider]  torsemide (DEMADEX) 20 MG tablet Take 40 mg by mouth 2 (two) times daily.   Yes [provider]  HYDROcodone-acetaminophen (NORCO) 5-325 MG tablet Take 1-2 tablets by mouth every 6 (six) hours as needed for moderate pain. Patient not taking: Reported on 04/07/2017 05/16/15   Netta Cedars, MD  methocarbamol (ROBAXIN) 500 MG tablet Take 1 tablet (500 mg total) by mouth 3 (three) times daily as needed. Patient not taking: Reported on 04/07/2017 05/16/15   Netta Cedars, MD  warfarin (COUMADIN) 5 MG tablet Take 1 tablet (5 mg total) by mouth daily. Take as directed per the pharmacist for INR target from 2.5-3.0 for 30 days post op Patient not taking: Reported on 04/07/2017 05/16/15   Netta Cedars, MD  PT WAS NOT ON COUMADIN  Inpatient Medications: Scheduled Meds: . diltiazem  120 mg Oral Daily  . furosemide  60 mg Intravenous TID  . levETIRAcetam  500 mg Oral Daily  . potassium chloride  20 mEq Oral BID   Continuous Infusions:  PRN Meds: acetaminophen **OR** acetaminophen, acetaminophen, hydrALAZINE, ondansetron (ZOFRAN) IV, polyethylene glycol, zolpidem  Allergies:    Allergies  Allergen Reactions  . Heparin Other (See Comments)    HIT  . Lovenox [Enoxaparin Sodium] Other (See Comments)    HIT  . Metoprolol Shortness Of Breath  . Percocet [Oxycodone-Acetaminophen] Nausea And Vomiting  . Prednisone Other (See Comments)    wheeze  . Sulfonamide Derivatives Rash    Social History:   Social History   Socioeconomic History  . Marital status: Married    Spouse name: Not on file  . Number of children: Not on file  . Years of education: Not on file  . Highest education level: Not on file  Social Needs  . Financial resource strain: Not on file  . Food insecurity - worry: Not on file  . Food insecurity - inability: Not on file  . Transportation needs - medical: Not on file  . Transportation needs - non-medical: Not  on file  Occupational History  . Not on file  Tobacco Use  . Smoking status: Former Smoker    Years: 8.00  . Smokeless tobacco: Never Used  . Tobacco comment: quit age 13  Substance and Sexual Activity  . Alcohol use: No  . Drug use: No  . Sexual activity: Not on file  Other Topics Concern  . Not on file  Social History Narrative  . Not on file    Family History:    Family  History  Problem Relation Age of Onset  . Heart failure Mother   . Valvular heart disease Mother   . Cancer Father     Mother had hx of rheumatic fever  ROS:  Please see the history of present illness.  General:no colds or fevers, no weight changes Skin:no rashes or ulcers HEENT:no blurred vision, no congestion CV:see HPI PUL:see HPI GI:no diarrhea constipation or melena, no indigestion GU:no hematuria, no dysuria MS:no joint pain, no claudication Neuro:no syncope, no lightheadedness Endo:no diabetes, no thyroid disease  All other ROS reviewed and negative.     Physical Exam/Data:   Vitals:   04/08/17 0300 04/08/17 0630 04/08/17 0823 04/08/17 1154  BP: 132/60 (!) 115/5 (!) 100/51 109/62  Pulse: 98 (!) 102 96 (!) 102  Resp: (!) 24 (!) 24 18 19   Temp:  98.3 F (36.8 C)  97.9 F (36.6 C)  TempSrc:  Oral  Oral  SpO2: 96% 98%  95%  Weight: 162 lb 8 oz (73.7 kg)     Height: 5\' 6"  (1.676 m)       Intake/Output Summary (Last 24 hours) at 04/08/2017 1505 Last data filed at 04/08/2017 1421 Gross per 24 hour  Intake 490 ml  Output 1670 ml  Net -1180 ml   Filed Weights   04/07/17 1639 04/08/17 0005 04/08/17 0300  Weight: 160 lb (72.6 kg) 162 lb (73.5 kg) 162 lb 8 oz (73.7 kg)   Body mass index is 26.23 kg/m.  General:  Well developed but ill in appearance , in no acute distress sitting in a chair, where she is able to breathe the best HEENT: normal Lymph: no adenopathy Neck: no JVD sitting upright Endocrine:  No thryomegaly Vascular: No carotid bruits; 2+ pedal pulses bilaterally     Cardiac:  normal S1, S2; RRR; no murmur, gallup , rub or click  Lungs:  Breath sounds diminished in base to auscultation bilaterally, no wheezing, rhonchi or rales  Abd: soft, nontender, no hepatomegaly  Ext: no edema Musculoskeletal:  No deformities, BUE and BLE strength normal and equal Skin: warm and dry  Neuro:  Alert and oriented X 3 MAE follows commands, no focal abnormalities noted Psych:  Normal affect   Relevant CV Studies: Echo  ECHOCARDIOGRAPHIC DESCRIPTIONS ----------------------------------------------- AORTIC ROOT Size: DILATED Dissection: INDETERM FOR DISSECTION  AORTIC VALVE Leaflets: Tricuspid Morphology: MILDLY THICKENED Mobility: Fully Mobile  LEFT VENTRICLEAnterior: Normal Size: SMALLLateral: Normal  Contraction: REGIONALLY IMPAIRED Septal: HYPOCONTRACTILE Closest EF: >55%(Estimated)Calc.EF: 58% (2D)Apical: Normal  LV masses: N/A Inferior: Normal  LVH: None CONCENTRICPosterior: Normal Dias.FxClass: INDETERMINATE  MITRAL VALVE Leaflets: ABNORMALMobility: Fully mobile Morphology: PROSTHETIC RING  MV Note: 60mm Medtronic Simulus Semi-rigid Ring  LEFT ATRIUM Size: SEVERELY ENLARGED  LA masses: N/A Normal IAS  MAIN PA Size: DILATED  PULMONIC VALVE Morphology: Normal Mobility: Fully Mobile  RIGHT VENTRICLE Size: MILDLY ENLARGED Free wall: HYPOCONTRACTILE  Contraction: MOD GLOBAL DECREASE RV masses: N/A  TRICUSPID VALVE Leaflets: NormalMobility: Fully mobile Morphology: PROSTHETIC RING  TV Note: 45mm Medtronic TriAd Tricuspid Annuloplasty Ring  RIGHT ATRIUM Size: Not seen RA Other: None  RA  masses: N/A  PERICARDIUM  Fluid: MILD EFFUSIONPOSTERIOR  Pleural Eff: PLEURAL EFFUSION NOTED  INFERIOR VENACAVA Size: DILATEDABNORMAL RESPIRATORY COLLAPSE   Aortic: No ARNo AS   Mitral: No MRPROSTHETIC MV RING 1.8 m/s peak vel 13 mmHg peak grad 7 mmHg mean grad  MV Inflow E Vel.= nm* cm/sMV Annulus E'Vel.= nm* cm/sE/E'Ratio= nm*  Tricuspid: TRIVIAL TR PROSTHETIC TV RING 0.9 m/s peak vel3 mmHg  peak grad 1 mmHg mean grad  Pulmonary: TRIVIAL PR No PS  Other:  DEFINITY CONTRAST SHOWS ENHANCED LV BORDERS  Laboratory Data:  Chemistry Recent Labs  Lab 04/07/17 1644  NA 135  K 3.6  CL 90*  CO2 31  GLUCOSE 125*  BUN 15  CREATININE 1.05*  CALCIUM 9.3  GFRNONAA 51*  GFRAA 59*  ANIONGAP 14    No results for input(s): PROT, ALBUMIN, AST, ALT, ALKPHOS, BILITOT in the last 168 hours. Hematology Recent Labs  Lab 04/07/17 1644  WBC 16.7*  RBC 4.33  HGB 12.1  HCT 38.2  MCV 88.2  MCH 27.9  MCHC 31.7  RDW 15.3  PLT 366   Cardiac Enzymes Recent Labs  Lab 04/08/17 0110 04/08/17 0658 04/08/17 1245  TROPONINI <0.03 <0.03 <0.03    Recent Labs  Lab 04/07/17 1711  TROPIPOC 0.01    BNP Recent Labs  Lab 04/07/17 1644  BNP 210.3*    DDimer No results for input(s): DDIMER in the last 168 hours.  Radiology/Studies:  Dg Chest 2 View  Result Date: 04/07/2017 CLINICAL DATA:  Shortness of breath for 3 days EXAM: CHEST  2 VIEW COMPARISON:  08/28/2004 FINDINGS: Chronic cardiomegaly. Mitral and tricuspid valve repair. Enlarged main pulmonary artery. Low volume chest with small pleural effusions. There is generalized interstitial coarsening with fissure thickening. Lower lung opacities which could be atelectasis or infection. IMPRESSION: 1. Probable CHF. 2. Lower lung opacities could be atelectasis or infection. Electronically Signed   By: Monte Fantasia  M.D.   On: 04/07/2017 17:56    Assessment and Plan:   1. CHF, acute on chronic diastolic HF  Recent hospitalization with d/c 03/29/17 thought to be due to a fib, now SR.   Currently neg 980  On IV lasix 60 mg TID  Pl effusion on CXR-- ? Decubitus film her eliquis is on hold for possible thoracentesis.   Other thought would be Ct of chest-  2. Recent 02/2017 at [facility]  MV and TV repair with rings and stable on recent echo.    3. Minimal CAD on cath 02/2017 prior to surgery 4. Persistent AF with DCCV on 03/28/17 successful on Eliquis 5. Hx PE 02/2017 on Eliquis  6.  + HIT with Heparin 7. Leukocytosis with pending blood cultures on ABX   For questions or updates, please contact [place] Please consult [email or web address] for contact info under Cardiology/STEMI.   Signed, Cecilie Kicks, NP  04/08/2017 3:05 PM    Patient seen and examined. Agree with assessment and plan.  Marissa Ponce a 78 year old white female who is followed at [facility] for cardiology care.  She has a history of atrial  flutter and  fibrillation and had been on chronic eliquis therapy.  She had held her eliquis according to her report for almost 6 days prior to recent heart surgery at which time she underwent mitral valve and tricuspid valve annuloplasty with repair for significant regurgitation.  Her postoperative course was complicated by a TIA with focal seizure.  She also developed heparin-induced thrombocytopenia.  She has previously been documented have mild LAD disease with normal RCA and left circumflex done prior to her valve surgery.  She develop recurrent atrial fibrillation following surgery.  In addition to a PE and has been on eliquis.  She initially was treated with amiodarone but did not tolerate this due to side effects.  Recently she had been on diltiazem, lisinopril, and torsemide.  She now presents with several days of increasing shortness  of breath.  A prior echo Doppler study had shown normal systolic function with  diastolic dysfunction.  She has developed almost 4 pillow orthopnea, and a chest x-ray has shown bilateral pleural effusions.  She had taken her eliquis dose today, but I understand subsequent dosing is currently on hold.  Her ECG shows sinus tachycardia at 103 bpm.  Exam blood pressure is 110/70.  She was tachycardic at approximately 100 bpm.  JVD approximated 78 cm.  She had decreased breath sounds bilaterally.  Rhythm was regular with an occasional ectopic complex.  I do not appreciate any significant systolic murmur.  Abdomen was nontender, positive bowel sounds without hepatosplenomegaly.  There was no edema.  Laboratory is notable for potassium 3.6.  BUN 15, currently 1.05.  BNP is mildly increased at 210 and consistent with diastolic heart failure.  Troponin is negative.  She is not anemic.  Platelets are 36 6000.  INR is 2.38.  She has been on eliquis, not warfarin.  At present, agree with continuation of diuresis.  Recommend bilateral decubitus x-rays to see if her effusions layer out.  Left effusion appears greater on routine chest x-ray.  If significant diuresis cannot reduce fusions, the patient may require thoracentesis.  However, anticoagulation becomes an issue since eliquis will need to be hold 48 hours, but the patient has a recent history of AF, status post cardioversion and PE and also has a history of HIT with severe heparin-induced thrombocytopenia.  Although the patient is tachycardic, there is reported intolerance to beta blocker therapy.  Consider increasing diltiazem dose of blood pressure allows.  Will follow.  Troy Sine, MD, [facility] 04/08/2017 3:52 PM

## 2017-04-08 NOTE — ED Notes (Signed)
Attempted report X1

## 2017-04-08 NOTE — H&P (Signed)
History and Physical    Marissa Ponce PFX:[id number] DOB: [birth year] DOA: 04/07/2017  PCP: Ronita Hipps, MD   Consultants: Cardiology  Patient coming from: Home - lives with her husband   Chief Complaint: Shortness of breath HPI: Marissa Ponce is a 77 y.o. female with medical history significant of CHF, Afib, pulmonary embolism-on Eliquis, recent heart surgery for MV and TV plasty and MAZE  (02/22/2017), migraine headaches who presented to the ED for evaluation of progressive shortness of breath.  Her post op period was complicated by right sided stroke and seizures. Currently she has no residual right sided weakness and is maintained on Keppra. Patient complained of PND and orthopnea - sleeping on 4 pillows or at times even in recliner and exertional shortness of breath even with minimal exertion. Patient was seen by her PCP, underwent chest x-ray that showed vascular congestion and she was recommended to come to the ED for evaluation. She denied chest pain, cough, fever, abdominal pain, nausea vomiting, headache.   ED Course: On presentation to the ED patient was hypoxic with SPO2 showing 86% on room air, blood pressure was 102/54, heart rate 95, temperature 97.38F. Blood work demonstrated normal troponin 0 less than 0.03, lactic acid 0.95, CMP was unremarkable except creatinine 1.05, BNP 210.3, white blood cells count elevated to 16,700 Chest x-ray showed CHF and was suspicious for lower lung opacities that could be atelectasis versus infection, bilateral small pleural effusions EKG demonstrated sinus rhythm without acute ST-T wave changes  Review of Systems: As per HPI; otherwise review of systems reviewed and negative.   Ambulatory Status: Ambulates without assistance  Past Medical History:  Diagnosis Date  . Arthritis   . Complication of anesthesia   . Dysrhythmia    with caffine, "PVC's"  . Migraines     history of none recent  . PONV (postoperative nausea and vomiting)    . Shortness of breath dyspnea    with exertion    Past Surgical History:  Procedure Laterality Date  . ABDOMINAL HYSTERECTOMY    . ANTERIOR AND POSTERIOR REPAIR    . CARDIAC SURGERY     micuspid and tricuspid valve in 02/22/17  . CHOLECYSTECTOMY    . COLONOSCOPY    . ESOPHAGEAL DILATION    . HERNIA REPAIR Left    Ingunial  . ROTATOR CUFF REPAIR Right 2010ish  . TOTAL KNEE ARTHROPLASTY Left 05/16/2015   Procedure: LEFT TOTAL KNEE ARTHROPLASTY;  Surgeon: Netta Cedars, MD;  Location: [place];  Service: Orthopedics;  Laterality: Left;    Social History   Socioeconomic History  . Marital status: Married    Spouse name: Not on file  . Number of children: Not on file  . Years of education: Not on file  . Highest education level: Not on file  Social Needs  . Financial resource strain: Not on file  . Food insecurity - worry: Not on file  . Food insecurity - inability: Not on file  . Transportation needs - medical: Not on file  . Transportation needs - non-medical: Not on file  Occupational History  . Not on file  Tobacco Use  . Smoking status: Former Smoker    Years: 8.00  . Tobacco comment: quit age 21  Substance and Sexual Activity  . Alcohol use: No  . Drug use: No  . Sexual activity: Not on file  Other Topics Concern  . Not on file  Social History Narrative  . Not on file  Allergies  Allergen Reactions  . Heparin Other (See Comments)    HIT  . Lovenox [Enoxaparin Sodium] Other (See Comments)    HIT  . Metoprolol Shortness Of Breath  . Percocet [Oxycodone-Acetaminophen] Nausea And Vomiting  . Prednisone Other (See Comments)    wheeze  . Sulfonamide Derivatives Rash    History reviewed. No pertinent family history.  Prior to Admission medications   Medication Sig Start Date End Date Taking? Authorizing Provider  apixaban (ELIQUIS) 5 MG TABS tablet Take 5 mg by mouth 2 (two) times daily.   Yes [provider]  diltiazem (DILACOR XR) 120 MG 24 hr  capsule Take 120 mg by mouth daily.   Yes [provider]  levETIRAcetam (KEPPRA) 500 MG tablet Take 500 mg by mouth daily.   Yes [provider]  potassium chloride (K-DUR,KLOR-CON) 10 MEQ tablet Take 10 mEq by mouth 2 (two) times daily.   Yes [provider]  torsemide (DEMADEX) 20 MG tablet Take 40 mg by mouth 2 (two) times daily.   Yes [provider]  HYDROcodone-acetaminophen (NORCO) 5-325 MG tablet Take 1-2 tablets by mouth every 6 (six) hours as needed for moderate pain. Patient not taking: Reported on 04/07/2017 05/16/15   Netta Cedars, MD  methocarbamol (ROBAXIN) 500 MG tablet Take 1 tablet (500 mg total) by mouth 3 (three) times daily as needed. Patient not taking: Reported on 04/07/2017 05/16/15   Netta Cedars, MD  warfarin (COUMADIN) 5 MG tablet Take 1 tablet (5 mg total) by mouth daily. Take as directed per the pharmacist for INR target from 2.5-3.0 for 30 days post op Patient not taking: Reported on 04/07/2017 05/16/15   Netta Cedars, MD    Physical Exam: Vitals:   04/08/17 0230 04/08/17 0300 04/08/17 0630 04/08/17 0823  BP: (!) 115/52 132/60 (!) 115/5 (!) 100/51  Pulse: 99 98 (!) 102 96  Resp: (!) 26 (!) 24 (!) 24 18  Temp:   98.3 F (36.8 C)   TempSrc:   Oral   SpO2: 94% 96% 98%   Weight:  73.7 kg (162 lb 8 oz)    Height:  5\' 6"  (1.676 m)       General:  Appears calm and somewhat uncomfortable with orthopnea Eyes: PERRLA, sckerae unicteric, EOM intact,  ENT: normal external ear canals, oral mucous membranes moist and intact, no nasal discharge Neck: no lympnadenopathy, masses or thyromegaly Cardiovascular: RRR, no murmurs, gallops or rubs, no LE edema. Respiratory: crackles upper 1/2 and significantly diminished/poorley audible 1/2 down bilaterally. Normal respiratory effort. Abdomen: soft, NT / ND, BS (+) 4, no masses or organomegaly appreciated Skin: no rash or induration seen on limited exam Musculoskeletal: no joint deformities  observed, active full ROM  Psychiatric: grossly normal mood and affect, speech fluent and appropriate Neurologic: CN II-XII grossly normal, no focal deficit visualized, moves all extremities independently,sensation intact.       Radiological Exams on Admission: Dg Chest 2 View  Result Date: 04/07/2017 CLINICAL DATA:  Shortness of breath for 3 days EXAM: CHEST  2 VIEW COMPARISON:  08/28/2004 FINDINGS: Chronic cardiomegaly. Mitral and tricuspid valve repair. Enlarged main pulmonary artery. Low volume chest with small pleural effusions. There is generalized interstitial coarsening with fissure thickening. Lower lung opacities which could be atelectasis or infection. IMPRESSION: 1. Probable CHF. 2. Lower lung opacities could be atelectasis or infection. Electronically Signed   By: Monte Fantasia M.D.   On: 04/07/2017 17:56    EKG: Independently reviewed.  Sinus tachycardia  with heart rate 104, no acute ST-T wave changes  Labs on Admission: I have personally reviewed the available labs and imaging studies at the time of the admission.   Assessment/Plan Principal Problem:   CHF exacerbation (HCC) Active Problems:   History of pulmonary embolus (PE)   A-fib (HCC)   Dyslipidemia   Status post mitral valve repair   History of stroke   Seizure (HCC)   Status post tricuspid valve repair    CHF exacerbation Continue IV diuresis, and monitor her daily weight, intake and output Last Echo at [facility] on 03/25/2017 showed EF 58% Monitor on telemetry Follow renal function and potassium while on increased doses of diuretic Doubt her SOB is secondary to infectious process thus will not continue antibiotic therapy Cardiology consult requested  Pleural effusion Patient is on anticoagulation therapy and at this point will order lateral decubitus XRay to evaluate effusions  and hold Eliquis in case she needs thoracentesis  Status post mitral valve repair - patient had a surgery at [facility] on  02/22/2017 Last echo in 03/25/17 showed EF greater than 55%, small LV, moderately enlarged RV We will repeat echocardiogram, continue current medications  History of pulmonary embolus - Eliquis is on hold in case she needs thoracentesis  Atrial fibrillation - patient underwent Maze procedure during her heart surgery. She maintains sinus rhythm, continue Cardizem.    DVT prophylaxis: SCD Code Status:  Full - confirmed with patient/family Family Communication: none Disposition Plan: Home once clinically improved Consults called: none  Admission status: inpatient   York Grice PA-C [phone] [facility]  If note is complete, please contact covering daytime or nighttime physician. [email or web address] Password [facility]  04/08/2017, 9:52 AM

## 2017-04-08 NOTE — Progress Notes (Signed)
Pt and family educated on SCDs prophylaxis. Pt refused SCDs at this time  Pt educated to ambulate  No history of falls in last 6 months

## 2017-04-08 NOTE — Progress Notes (Signed)
Paged ordering PA to inform pt received lasix 40 mg IV at 0941, PA ordered 60 mg IV at [place] stated to give 20 IV lasix and she will place order  PA also aware pt received all morning medications including eliquis

## 2017-04-08 NOTE — Care Management (Signed)
This is a no charge note  Pending admission per PA Rona Ravens  77 year old lady with past medical history of HIT, migraine headaches, atrial fibrillation on Eliquis, CHF, recent valve replacement, who presents with shortness or breath for 3 days. BNP 210, chest x-ray showed CHF. Patient is given 80 mg of Lasix in ED. Pt was also given one dose of vancomycin and cefepime due to leukocytosis and chest x-ray finding concerning for infiltration. Pt is admitted to telemetry bed as inpatient.    Ivor Costa, MD  [facility] Pager [phone]  If 7PM-7AM, please contact night-coverage [email or web address] Password [email or web address] 04/08/2017, 1:01 AM

## 2017-04-08 NOTE — Plan of Care (Signed)
  Nutrition: Adequate nutrition will be maintained 04/08/2017 0750 - Completed/Met by Evert Kohl, RN   Coping: Level of anxiety will decrease 04/08/2017 0750 - Completed/Met by Evert Kohl, RN   Nutrition: Adequate nutrition will be maintained 04/08/2017 0750 - Completed/Met by Evert Kohl, RN

## 2017-04-09 ENCOUNTER — Inpatient Hospital Stay (HOSPITAL_COMMUNITY): Payer: Medicare HMO

## 2017-04-09 DIAGNOSIS — I5033 Acute on chronic diastolic (congestive) heart failure: Secondary | ICD-10-CM | POA: Diagnosis present

## 2017-04-09 DIAGNOSIS — R569 Unspecified convulsions: Secondary | ICD-10-CM

## 2017-04-09 DIAGNOSIS — J90 Pleural effusion, not elsewhere classified: Secondary | ICD-10-CM

## 2017-04-09 DIAGNOSIS — I481 Persistent atrial fibrillation: Secondary | ICD-10-CM

## 2017-04-09 DIAGNOSIS — R0602 Shortness of breath: Secondary | ICD-10-CM

## 2017-04-09 DIAGNOSIS — Z86711 Personal history of pulmonary embolism: Secondary | ICD-10-CM

## 2017-04-09 HISTORY — DX: Pleural effusion, not elsewhere classified: J90

## 2017-04-09 LAB — BASIC METABOLIC PANEL
ANION GAP: 13 (ref 5–15)
BUN: 12 mg/dL (ref 6–20)
CALCIUM: 9 mg/dL (ref 8.9–10.3)
CO2: 31 mmol/L (ref 22–32)
CREATININE: 0.88 mg/dL (ref 0.44–1.00)
Chloride: 92 mmol/L — ABNORMAL LOW (ref 101–111)
GFR calc Af Amer: >60 mL/min (ref >60)
GLUCOSE: 129 mg/dL — AB (ref 65–99)
Potassium: 3.5 mmol/L (ref 3.5–5.1)
Sodium: 136 mmol/L (ref 135–145)

## 2017-04-09 MED ORDER — FUROSEMIDE 10 MG/ML IJ SOLN
40.0000 mg | Freq: Two times a day (BID) | INTRAMUSCULAR | Status: DC
  Administered 2017-04-09 – 2017-04-10 (×3): 40 mg via INTRAVENOUS
  Filled 2017-04-09 (×3): qty 4

## 2017-04-09 MED ORDER — SODIUM CHLORIDE 0.9 % IV SOLN: 1.0000 g | INTRAVENOUS | Status: DC

## 2017-04-09 MED ORDER — SODIUM CHLORIDE 0.9 % IV SOLN
500.0000 mg | INTRAVENOUS | Status: DC
  Administered 2017-04-10 – 2017-04-11 (×2): 500 mg via INTRAVENOUS
  Filled 2017-04-09 (×2): qty 500

## 2017-04-09 MED ORDER — SODIUM CHLORIDE 0.9 % IV SOLN
1.0000 g | Freq: Once | INTRAVENOUS | Status: AC
  Administered 2017-04-09: 1 g via INTRAVENOUS
  Filled 2017-04-09: qty 10

## 2017-04-09 MED ORDER — APIXABAN 5 MG PO TABS
5.0000 mg | ORAL_TABLET | Freq: Two times a day (BID) | ORAL | Status: DC
  Administered 2017-04-09 – 2017-04-14 (×11): 5 mg via ORAL
  Filled 2017-04-09 (×11): qty 1

## 2017-04-09 NOTE — Plan of Care (Signed)
  Cardiac: Ability to achieve and maintain adequate cardiopulmonary perfusion will improve 04/09/2017 0437 - Completed/Met by Evert Kohl, RN   Activity: Capacity to carry out activities will improve 04/09/2017 0437 - Completed/Met by Evert Kohl, RN   Cardiac: Ability to achieve and maintain adequate cardiopulmonary perfusion will improve 04/09/2017 0437 - Completed/Met by Evert Kohl, RN   Skin Integrity: Risk for impaired skin integrity will decrease 04/09/2017 0435 - Completed/Met by Evert Kohl, RN   Safety: Ability to remain free from injury will improve 04/09/2017 0435 - Completed/Met by Garnett-Mellinger, Sophronia Simas, RN

## 2017-04-09 NOTE — Plan of Care (Signed)
  Safety: Ability to remain free from injury will improve 04/09/2017 0435 - Completed/Met by Evert Kohl, RN   Skin Integrity: Risk for impaired skin integrity will decrease 04/09/2017 0435 - Completed/Met by Evert Kohl, RN

## 2017-04-09 NOTE — Progress Notes (Signed)
Patient continues to refuse bed alarm. Will continue to monitor patient. 

## 2017-04-09 NOTE — Progress Notes (Signed)
I came to do the echo but the patient stated that Dr. Claiborne Billings told her the day before that the echo would be canceled due to [place] having the results from an echo done at [facility] on 03/25/17.

## 2017-04-09 NOTE — Progress Notes (Signed)
Patient ID: Marissa Ponce, female   DOB: 01/03/1941, 77 y.o.   MRN: [id number]                                                                PROGRESS NOTE                                                                                                                                                                                                             Patient Demographics:    Marissa Ponce, is a 77 y.o. female, DOB - [birth year], RJJ:[id number]  Admit date - 04/07/2017   Admitting Physician Lady Deutscher, MD  Outpatient Primary MD for the patient is Ronita Hipps, MD  LOS - 1  Outpatient Specialists:     Chief Complaint  Patient presents with  . Shortness of Breath       Brief Narrative  77 y.o. female with medical history significant of CHF, Afib, pulmonary embolism-on Eliquis, recent heart surgery for MV and TV plasty and MAZE  (02/22/2017), migraine headaches who presented to the ED for evaluation of progressive shortness of breath.  Her post op period was complicated by right sided stroke and seizures. Currently she has no residual right sided weakness and is maintained on Keppra. Patient complained of PND and orthopnea - sleeping on 4 pillows or at times even in recliner and exertional shortness of breath even with minimal exertion. Patient was seen by her PCP, underwent chest x-ray that showed vascular congestion and she was recommended to come to the ED for evaluation. She denied chest pain, cough, fever, abdominal pain, nausea vomiting, headache.   ED Course: On presentation to the ED patient was hypoxic with SPO2 showing 86% on room air, blood pressure was 102/54, heart rate 95, temperature 97.8F. Blood work demonstrated normal troponin 0 less than 0.03, lactic acid 0.95, CMP was unremarkable except creatinine 1.05, BNP 210.3, white blood cells count elevated to 16,700 Chest x-ray showed CHF and was suspicious for lower lung opacities that could be atelectasis versus  infection, bilateral small pleural effusions EKG demonstrated sinus rhythm without acute ST-T wave changes     Subjective:    Marissa Ponce today states that her breathing is about the same as yesterday.  Pt still experiencing dyspnea.  Denies fever, chills, cough,  cp, palp, n/v, diarrhea.      Assessment  & Plan :    Principal Problem:   CHF exacerbation ([place]) Active Problems:   A-fib ([place])   Status post mitral valve repair   History of stroke   Seizure ([place])   Status post tricuspid valve repair   CHF exacerbation Last Echo at [facility] on 03/25/2017 showed EF 58% Per admitting physician, "doubt her SOB is secondary to infectious process thus will not continue antibiotic therapy" Continue Lasix 60mg  iv tid Consider spironolactone Check cbc, cmp in am Cardiology consult appreciated  Pleural effusion Awaiting bilateral decubitus CXR to see if effusions layer I spoke with interventional radiology and they are able to do thoracentesis even if patient on eliquis Will resume Eliquis  Status post mitral valve repair - had a surgery at [facility] on 02/22/2017 Last echo in 03/25/17 showed EF greater than 55%, small LV, moderately enlarged RV Awaiting repeat echocardiogram, continue current medications  History of pulmonary embolus -  Resume Eliquis as above  Atrial fibrillation -  Underwent Maze procedure during her heart surgery, Amiodarone intolerant Currently in sinus rhythm, continue Cardizem CD 120mg  po qday  TIA/Seizure d/o Cont Keppra  Insomnia  Ambien 5mg  po qhs prn   DVT prophylaxis: SCD  H/o HIT=> AVOID HEPARIN PRODUCTS Code Status:  Full Code Family Communication: none Disposition Plan: Home once clinically improved Consults called: none  Admission status: inpatient    Lab Results  Component Value Date   PLT 366 04/07/2017    Antibiotics  :  Vanco/ cefepime 2/21 only  Anti-infectives (From admission, onward)   Start     Dose/Rate Route  Frequency Ordered Stop   04/07/17 2330  ceFEPIme (MAXIPIME) 1 g in sodium chloride 0.9 % 100 mL IVPB     1 g 200 mL/hr over 30 Minutes Intravenous  Once 04/07/17 2315 04/08/17 0227   04/07/17 2330  vancomycin (VANCOCIN) 1,250 mg in sodium chloride 0.9 % 250 mL IVPB     1,250 mg 166.7 mL/hr over 90 Minutes Intravenous  Once 04/07/17 2318 04/08/17 0230        Objective:   Vitals:   04/08/17 1506 04/08/17 1952 04/08/17 2301 04/09/17 0639  BP: (!) 106/55 113/72 (!) 131/58 133/66  Pulse: 99 (!) 107 (!) 110 99  Resp: 19 18 18 18   Temp: 98.5 F (36.9 C) 98.6 F (37 C) 98.6 F (37 C) 97.9 F (36.6 C)  TempSrc: Oral Oral Oral Oral  SpO2: 94% 93% 95% 94%  Weight:    72.6 kg (160 lb 1.6 oz)  Height:        Wt Readings from Last 3 Encounters:  04/09/17 72.6 kg (160 lb 1.6 oz)  05/16/15 89.1 kg (196 lb 6.4 oz)  05/12/15 89.1 kg (196 lb 6.4 oz)     Intake/Output Summary (Last 24 hours) at 04/09/2017 0720 Last data filed at 04/08/2017 2259 Gross per 24 hour  Intake 480 ml  Output 1420 ml  Net -940 ml     Physical Exam  Awake Alert, Oriented X 3, No new F.N deficits, Normal affect [name].AT,PERRAL Supple Neck,slight  JVD, No cervical lymphadenopathy appriciated.  Symmetrical Chest wall movement, Good air movement bilaterally, + bilateral crackles, no wheezing RRR,No Gallops,Rubs or new Murmurs, No Parasternal Heave +ve B.Sounds, Abd Soft, No tenderness, No organomegaly appriciated, No rebound - guarding or rigidity. No Cyanosis, Clubbing or edema, No new Rash or bruise     Data Review:    CBC Recent  Labs  Lab 04/07/17 1644  WBC 16.7*  HGB 12.1  HCT 38.2  PLT 366  MCV 88.2  MCH 27.9  MCHC 31.7  RDW 15.3    Chemistries  Recent Labs  Lab 04/07/17 1644  NA 135  K 3.6  CL 90*  CO2 31  GLUCOSE 125*  BUN 15  CREATININE 1.05*  CALCIUM 9.3   ------------------------------------------------------------------------------------------------------------------ No  results for input(s): CHOL, HDL, LDLCALC, TRIG, CHOLHDL, LDLDIRECT in the last 72 hours.  No results found for: HGBA1C ------------------------------------------------------------------------------------------------------------------ No results for input(s): TSH, T4TOTAL, T3FREE, THYROIDAB in the last 72 hours.  Invalid input(s): FREET3 ------------------------------------------------------------------------------------------------------------------ No results for input(s): VITAMINB12, FOLATE, FERRITIN, TIBC, IRON, RETICCTPCT in the last 72 hours.  Coagulation profile Recent Labs  Lab 04/07/17 2327  INR 2.38    No results for input(s): DDIMER in the last 72 hours.  Cardiac Enzymes Recent Labs  Lab 04/08/17 0110 04/08/17 0658 04/08/17 1245  TROPONINI <0.03 <0.03 <0.03   ------------------------------------------------------------------------------------------------------------------    Component Value Date/Time   BNP 210.3 (H) 04/07/2017 1644    Inpatient Medications  Scheduled Meds: . diltiazem  120 mg Oral Daily  . furosemide  60 mg Intravenous TID  . levETIRAcetam  500 mg Oral Daily  . potassium chloride  20 mEq Oral BID   Continuous Infusions: PRN Meds:.acetaminophen **OR** acetaminophen, acetaminophen, hydrALAZINE, ondansetron (ZOFRAN) IV, polyethylene glycol, promethazine, zolpidem  Micro Results No results found for this or any previous visit (from the past 240 hour(s)).  Radiology Reports Dg Chest 2 View  Result Date: 04/07/2017 CLINICAL DATA:  Shortness of breath for 3 days EXAM: CHEST  2 VIEW COMPARISON:  08/28/2004 FINDINGS: Chronic cardiomegaly. Mitral and tricuspid valve repair. Enlarged main pulmonary artery. Low volume chest with small pleural effusions. There is generalized interstitial coarsening with fissure thickening. Lower lung opacities which could be atelectasis or infection. IMPRESSION: 1. Probable CHF. 2. Lower lung opacities could be  atelectasis or infection. Electronically Signed   By: Monte Fantasia M.D.   On: 04/07/2017 17:56    Time Spent in minutes  30   Jani Gravel M.D on 04/09/2017 at 7:20 AM  Between 7am to 7pm - Pager - [phone]    After 7pm go to [email or web address] - password [facility]  [facility] -  Office  [phone]

## 2017-04-09 NOTE — Progress Notes (Signed)
Progress Note  Patient Name: Marissa Ponce Date of Encounter: 04/09/2017  Primary Cardiologist: Richrd Humbles, MD   Subjective   She thinks her breathing is about the same as yesterday.  She is short of breath when walking to the bathroom.  She denies chest pain palpitations.  Transportation is just arrived to take her for an x-ray.  Inpatient Medications    Scheduled Meds: . apixaban  5 mg Oral BID  . diltiazem  120 mg Oral Daily  . levETIRAcetam  500 mg Oral Daily  . potassium chloride  20 mEq Oral BID   Continuous Infusions:  PRN Meds: acetaminophen **OR** acetaminophen, acetaminophen, hydrALAZINE, ondansetron (ZOFRAN) IV, polyethylene glycol, promethazine, zolpidem   Vital Signs    Vitals:   04/08/17 1506 04/08/17 1952 04/08/17 2301 04/09/17 0639  BP: (!) 106/55 113/72 (!) 131/58 133/66  Pulse: 99 (!) 107 (!) 110 99  Resp: 19 18 18 18   Temp: 98.5 F (36.9 C) 98.6 F (37 C) 98.6 F (37 C) 97.9 F (36.6 C)  TempSrc: Oral Oral Oral Oral  SpO2: 94% 93% 95% 94%  Weight:    160 lb 1.6 oz (72.6 kg)  Height:        Intake/Output Summary (Last 24 hours) at 04/09/2017 1105 Last data filed at 04/09/2017 0814 Gross per 24 hour  Intake 720 ml  Output 1145 ml  Net -425 ml   Filed Weights   04/08/17 0005 04/08/17 0300 04/09/17 0639  Weight: 162 lb (73.5 kg) 162 lb 8 oz (73.7 kg) 160 lb 1.6 oz (72.6 kg)    Telemetry    Sinus rhythm and sinus tachycardia- Personally Reviewed  ECG    n/a - Personally Reviewed  Physical Exam   GEN: No acute distress.   Neck: No JVD Cardiac: RRR, no murmurs, rubs, or gallops.  Respiratory: Faint rales at bilateral bases, diminished at bases b/l GI: Soft, nontender, non-distended  MS: No edema; No deformity. Neuro:  Nonfocal  Psych: Normal affect   Labs    Chemistry Recent Labs  Lab 04/07/17 1644 04/09/17 0532  NA 135 136  K 3.6 3.5  CL 90* 92*  CO2 31 31  GLUCOSE 125* 129*  BUN 15 12  CREATININE 1.05*  0.88  CALCIUM 9.3 9.0  GFRNONAA 51* >60  GFRAA 59* >60  ANIONGAP 14 13     Hematology Recent Labs  Lab 04/07/17 1644  WBC 16.7*  RBC 4.33  HGB 12.1  HCT 38.2  MCV 88.2  MCH 27.9  MCHC 31.7  RDW 15.3  PLT 366    Cardiac Enzymes Recent Labs  Lab 04/08/17 0110 04/08/17 0658 04/08/17 1245  TROPONINI <0.03 <0.03 <0.03    Recent Labs  Lab 04/07/17 1711  TROPIPOC 0.01     BNP Recent Labs  Lab 04/07/17 1644  BNP 210.3*     DDimer No results for input(s): DDIMER in the last 168 hours.   Radiology    Dg Chest 2 View  Result Date: 04/07/2017 CLINICAL DATA:  Shortness of breath for 3 days EXAM: CHEST  2 VIEW COMPARISON:  08/28/2004 FINDINGS: Chronic cardiomegaly. Mitral and tricuspid valve repair. Enlarged main pulmonary artery. Low volume chest with small pleural effusions. There is generalized interstitial coarsening with fissure thickening. Lower lung opacities which could be atelectasis or infection. IMPRESSION: 1. Probable CHF. 2. Lower lung opacities could be atelectasis or infection. Electronically Signed   By: Monte Fantasia M.D.   On: 04/07/2017 17:56    Cardiac Studies  See Dr. Evette Georges note dated 04/08/17  Patient Profile     77 y.o. female with a hx of PE on Eliquis, persistent a fib with DCCV 03/28/17, Maze 02/22/17, S/p MV and TV ring repair 02/22/17, post op TIA with focal seizure and HIT who is being seen today for the evaluation of SOB and acute on chronic diastolic CHF at the request of Dr. Evangeline Gula.  Assessment & Plan    1. Acute on chronic diastolic heart failure: Minimal symptomatic improvement since yesterday. Going down for chest xray now. Negative 1265 cc. No Lasix ordered at present.  Renal function is normal.  I will start IV Lasix 40 mg twice daily.  2. History of PE: On Eliquis 5 mg bid.  3. Persistent atrial fibrillation: On long-acting diltiazem and apixaban.  4. S/p MV and TV repair: Stable.    For questions or updates, please  contact [place] Please consult [email or web address] for contact info under Cardiology/STEMI.      Signed, Kate Sable, MD  04/09/2017, 11:05 AM

## 2017-04-09 NOTE — Progress Notes (Signed)
Called radiology regarding pt xrays  Lauren in radiology stated pt will be transported shortly  Pt and pt family aware

## 2017-04-09 NOTE — Progress Notes (Signed)
Called from radiology, radiologist stated pt cannot tolerate laying flat. Primary RN stated okay to place pt on 2L nasal cannula and if no improvement okay to place transport back to the unit and RN will inform MD

## 2017-04-10 DIAGNOSIS — I10 Essential (primary) hypertension: Secondary | ICD-10-CM

## 2017-04-10 DIAGNOSIS — Z8673 Personal history of transient ischemic attack (TIA), and cerebral infarction without residual deficits: Secondary | ICD-10-CM

## 2017-04-10 DIAGNOSIS — J189 Pneumonia, unspecified organism: Secondary | ICD-10-CM | POA: Diagnosis present

## 2017-04-10 LAB — COMPREHENSIVE METABOLIC PANEL
ALBUMIN: 2.7 g/dL — AB (ref 3.5–5.0)
ALK PHOS: 113 U/L (ref 38–126)
ALT: 13 U/L — ABNORMAL LOW (ref 14–54)
ANION GAP: 12 (ref 5–15)
AST: 31 U/L (ref 15–41)
BUN: 12 mg/dL (ref 6–20)
CALCIUM: 9 mg/dL (ref 8.9–10.3)
CHLORIDE: 94 mmol/L — AB (ref 101–111)
CO2: 30 mmol/L (ref 22–32)
Creatinine, Ser: 0.7 mg/dL (ref 0.44–1.00)
GFR calc Af Amer: >60 mL/min (ref >60)
GFR calc non Af Amer: >60 mL/min (ref >60)
GLUCOSE: 127 mg/dL — AB (ref 65–99)
Potassium: 4 mmol/L (ref 3.5–5.1)
SODIUM: 136 mmol/L (ref 135–145)
Total Bilirubin: 0.2 mg/dL — ABNORMAL LOW (ref 0.3–1.2)
Total Protein: 6.9 g/dL (ref 6.5–8.1)

## 2017-04-10 LAB — CBC
HCT: 36.9 % (ref 36.0–46.0)
HEMOGLOBIN: 11.7 g/dL — AB (ref 12.0–15.0)
MCH: 28.2 pg (ref 26.0–34.0)
MCHC: 31.7 g/dL (ref 30.0–36.0)
MCV: 88.9 fL (ref 78.0–100.0)
Platelets: 325 10*3/uL (ref 150–400)
RBC: 4.15 MIL/uL (ref 3.87–5.11)
RDW: 15.4 % (ref 11.5–15.5)
WBC: 12.9 10*3/uL — ABNORMAL HIGH (ref 4.0–10.5)

## 2017-04-10 LAB — STREP PNEUMONIAE URINARY ANTIGEN: Strep Pneumo Urinary Antigen: NEGATIVE

## 2017-04-10 MED ORDER — METOLAZONE 2.5 MG PO TABS
2.5000 mg | ORAL_TABLET | Freq: Once | ORAL | Status: AC
  Administered 2017-04-10: 2.5 mg via ORAL
  Filled 2017-04-10: qty 1

## 2017-04-10 MED ORDER — BOOST / RESOURCE BREEZE PO LIQD CUSTOM
1.0000 | Freq: Three times a day (TID) | ORAL | Status: DC
  Administered 2017-04-10 – 2017-04-14 (×4): 1 via ORAL

## 2017-04-10 MED ORDER — ORAL CARE MOUTH RINSE
15.0000 mL | Freq: Two times a day (BID) | OROMUCOSAL | Status: DC
  Administered 2017-04-10 – 2017-04-14 (×7): 15 mL via OROMUCOSAL

## 2017-04-10 MED ORDER — SODIUM CHLORIDE 0.9 % IV SOLN
1.0000 g | Freq: Three times a day (TID) | INTRAVENOUS | Status: DC
  Administered 2017-04-10 – 2017-04-11 (×3): 1 g via INTRAVENOUS
  Filled 2017-04-10 (×4): qty 1

## 2017-04-10 MED ORDER — VANCOMYCIN HCL 10 G IV SOLR
1500.0000 mg | Freq: Once | INTRAVENOUS | Status: AC
  Administered 2017-04-10: 1500 mg via INTRAVENOUS
  Filled 2017-04-10: qty 1500

## 2017-04-10 MED ORDER — VANCOMYCIN HCL IN DEXTROSE 1-5 GM/200ML-% IV SOLN
1000.0000 mg | Freq: Two times a day (BID) | INTRAVENOUS | Status: DC
Start: 2017-04-10 — End: 2017-04-11
  Administered 2017-04-10: 1000 mg via INTRAVENOUS
  Filled 2017-04-10 (×2): qty 200

## 2017-04-10 MED ORDER — PROMETHAZINE HCL 12.5 MG PO TABS
6.2500 mg | ORAL_TABLET | Freq: Four times a day (QID) | ORAL | Status: DC | PRN
  Administered 2017-04-10 – 2017-04-13 (×2): 6.25 mg via ORAL
  Filled 2017-04-10 (×4): qty 1

## 2017-04-10 MED ORDER — PRO-STAT SUGAR FREE PO LIQD
30.0000 mL | Freq: Two times a day (BID) | ORAL | Status: DC
  Administered 2017-04-10 – 2017-04-14 (×6): 30 mL via ORAL
  Filled 2017-04-10 (×9): qty 30

## 2017-04-10 MED ORDER — FUROSEMIDE 10 MG/ML IJ SOLN
80.0000 mg | Freq: Two times a day (BID) | INTRAMUSCULAR | Status: DC
  Administered 2017-04-10: 80 mg via INTRAVENOUS
  Administered 2017-04-10: 40 mg via INTRAVENOUS
  Administered 2017-04-11 – 2017-04-12 (×3): 80 mg via INTRAVENOUS
  Filled 2017-04-10 (×5): qty 8

## 2017-04-10 NOTE — Progress Notes (Signed)
Progress Note  Patient Name: Marissa Ponce Date of Encounter: 04/10/2017  Primary Cardiologist: Richrd Humbles, MD   Subjective   Short of breath with no change in symptoms since yesterday. I/O's look even but I am uncertain about accuracy. The patient said she urinated 200 cc last night and another 200 cc now. I spoke with Dr. Maudie Mercury who spoke with radiology, and they said the left pleural effusion is more in the small to moderate range and not enough to perform thoracentesis. CT showed bilateral lower lobe consolidation as well. She denies chest pain.  Inpatient Medications    Scheduled Meds: . apixaban  5 mg Oral BID  . diltiazem  120 mg Oral Daily  . furosemide  80 mg Intravenous BID  . levETIRAcetam  500 mg Oral Daily  . mouth rinse  15 mL Mouth Rinse BID  . metolazone  2.5 mg Oral Once  . potassium chloride  20 mEq Oral BID   Continuous Infusions: . azithromycin Stopped (04/10/17 0215)  . cefTRIAXone (ROCEPHIN)  IV     PRN Meds: acetaminophen **OR** acetaminophen, acetaminophen, hydrALAZINE, ondansetron (ZOFRAN) IV, polyethylene glycol, promethazine, zolpidem   Vital Signs    Vitals:   04/09/17 1610 04/09/17 2039 04/10/17 0030 04/10/17 0609  BP:  117/77 118/65 138/72  Pulse:  (!) 104 95 94  Resp: 19 18 18 18   Temp:  98.4 F (36.9 C) 98.3 F (36.8 C) (!) 97.5 F (36.4 C)  TempSrc:  Oral Oral Oral  SpO2: 99% 98% 96% 95%  Weight:    160 lb 8 oz (72.8 kg)  Height:        Intake/Output Summary (Last 24 hours) at 04/10/2017 0929 Last data filed at 04/10/2017 0615 Gross per 24 hour  Intake 1070 ml  Output 1150 ml  Net -80 ml   Filed Weights   04/08/17 0300 04/09/17 0639 04/10/17 0609  Weight: 162 lb 8 oz (73.7 kg) 160 lb 1.6 oz (72.6 kg) 160 lb 8 oz (72.8 kg)    Telemetry    Sinus rhythm - Personally Reviewed   Physical Exam   GEN: Mildly tachypneic.   Neck: No JVD Cardiac: RRR, no murmurs, rubs, or gallops.  Respiratory: Diminished at bases  bilaterally. GI: Soft, nontender, non-distended  MS: No edema; No deformity. Neuro:  Nonfocal  Psych: Normal affect   Labs    Chemistry Recent Labs  Lab 04/07/17 1644 04/09/17 0532 04/10/17 0333  NA 135 136 136  K 3.6 3.5 4.0  CL 90* 92* 94*  CO2 31 31 30   GLUCOSE 125* 129* 127*  BUN 15 12 12   CREATININE 1.05* 0.88 0.70  CALCIUM 9.3 9.0 9.0  PROT  --   --  6.9  ALBUMIN  --   --  2.7*  AST  --   --  31  ALT  --   --  13*  ALKPHOS  --   --  113  BILITOT  --   --  0.2*  GFRNONAA 51* >60 >60  GFRAA 59* >60 >60  ANIONGAP 14 13 12      Hematology Recent Labs  Lab 04/07/17 1644 04/10/17 0333  WBC 16.7* 12.9*  RBC 4.33 4.15  HGB 12.1 11.7*  HCT 38.2 36.9  MCV 88.2 88.9  MCH 27.9 28.2  MCHC 31.7 31.7  RDW 15.3 15.4  PLT 366 325    Cardiac Enzymes Recent Labs  Lab 04/08/17 0110 04/08/17 0658 04/08/17 1245  TROPONINI <0.03 <0.03 <0.03    Recent  Labs  Lab 04/07/17 1711  TROPIPOC 0.01     BNP Recent Labs  Lab 04/07/17 1644  BNP 210.3*     DDimer No results for input(s): DDIMER in the last 168 hours.   Radiology    X-ray Chest Pa And Lateral  Result Date: 04/09/2017 CLINICAL DATA:  Shortness of breath. EXAM: CHEST - BILATERAL DECUBITUS VIEW; CHEST - 2 VIEW COMPARISON:  Chest x-ray dated April 07, 2017. FINDINGS: Stable cardiomegaly. Prior mitral and tricuspid valve replacement. Persistent low lung volumes with slight interval increase in size of moderate left pleural effusion. Unchanged small right pleural effusion. Left greater than right basilar airspace disease. No pneumothorax. No acute osseous abnormality. IMPRESSION: 1. Minimal increase in size of moderate left pleural effusion. 2. Unchanged left greater than right basilar airspace disease. Electronically Signed   By: Titus Dubin M.D.   On: 04/09/2017 12:28   Ct Chest Wo Contrast  Result Date: 04/09/2017 CLINICAL DATA:  Shortness of breath EXAM: CT CHEST WITHOUT CONTRAST TECHNIQUE:  Multidetector CT imaging of the chest was performed following the standard protocol without IV contrast. COMPARISON:  Chest x-ray 04/09/2017, CT abdomen 05/12/2010 FINDINGS: Cardiovascular: Limited evaluation without intravenous contrast. Mild aneurysmal dilatation of the ascending aorta, measuring up to 4.1 cm. Mild aortic atherosclerosis. Enlarged pulmonary trunk measuring 3.5 cm. Cardiomegaly with valve prosthesis. Coronary artery calcification. Small pericardial effusion. Mediastinum/Nodes: Midline trachea. No thyroid mass. 11 mm lymph node anterior to the carina, with fatty hilus. Esophagus within normal limits. Lungs/Pleura: Moderate left pleural effusion, significant portion of which appears sub pulmonic though this is only partially visualized. Consolidation is present within the bilateral lower lobes. No pneumothorax Upper Abdomen: No acute abnormality. Musculoskeletal: Degenerative changes. False appearance of sternal fracture due to respiratory motion IMPRESSION: 1. Moderate left pleural effusion, substantial portion of which appears to be sub pulmonic but this is incompletely visualized. 2. Bilateral lower lobe consolidations may reflect atelectasis pneumonia or aspiration 3. Cardiomegaly with small pericardial effusion 4. Ascending aortic aneurysm measuring up to 4.1 cm. Recommend annual imaging followup by CTA or MRA. This recommendation follows 2010 ACCF/AHA/AATS/ACR/ASA/SCA/SCAI/SIR/STS/SVM Guidelines for the Diagnosis and Management of Patients with Thoracic Aortic Disease. Circulation. 2010; 121: e266-e369 5. Enlarged pulmonary trunk raises possibility of pulmonary artery hypertension Aortic Atherosclerosis (ICD10-I70.0). Electronically Signed   By: Donavan Foil M.D.   On: 04/09/2017 21:56   Dg Chest Bilateral Decubitus  Result Date: 04/09/2017 CLINICAL DATA:  Shortness of breath. EXAM: CHEST - BILATERAL DECUBITUS VIEW; CHEST - 2 VIEW COMPARISON:  Chest x-ray dated April 07, 2017. FINDINGS:  Stable cardiomegaly. Prior mitral and tricuspid valve replacement. Persistent low lung volumes with slight interval increase in size of moderate left pleural effusion. Unchanged small right pleural effusion. Left greater than right basilar airspace disease. No pneumothorax. No acute osseous abnormality. IMPRESSION: 1. Minimal increase in size of moderate left pleural effusion. 2. Unchanged left greater than right basilar airspace disease. Electronically Signed   By: Titus Dubin M.D.   On: 04/09/2017 12:28    Cardiac Studies    See Dr. Evette Georges note dated 04/08/17  Patient Profile     77 y.o. female with a hx of PE on Eliquis, persistent a fib with DCCV 03/28/17, Maze 02/22/17, S/p MV and TV ring repair 02/22/17, post op TIA with focal seizure and HITwho is being seen today for the evaluation of SOB and acute on chronic diastolic CHF at the request of Dr. Evangeline Gula.  Assessment & Plan    1. Acute on  chronic diastolic heart failure: No symptomatic improvement since yesterday.  I/O's look even but I am uncertain about accuracy. The patient said she urinated 200 cc last night and another 200 cc now. I spoke with Dr. Maudie Mercury who spoke with radiology, and they said the left pleural effusion is more in the small to moderate range and not enough to perform thoracentesis. CT showed bilateral lower lobe consolidation as well. .  I will increase IV Lasix to 80 mg bid (asked nurse to give now) and also administer 2.5 mg metolazone.  IM managing antimicrobial therapy.  2. History of PE: On Eliquis 5 mg bid.  3. Persistent atrial fibrillation: On long-acting diltiazem and apixaban.  4. S/p MV and TV repair: Stable.  5. Community-acquired pneumonia: On IV ceftriaxone and azithromycin.  6. Hypertension: BP is controlled.   For questions or updates, please contact [place] Please consult [email or web address] for contact info under Cardiology/STEMI.      Signed, Kate Sable, MD  04/10/2017, 9:29 AM

## 2017-04-10 NOTE — Progress Notes (Signed)
Pharmacy Antibiotic Note  Marissa Ponce is a 77 y.o. female with concern of  pneumonia.  Pharmacy has been consulted for vancomycin/cefepime dosing. -WBC= 12.9, afeb, CrCl ~ 60   Plan:  Cefepime 1gm IV q8h Vanc 1500 mg IV x1 followed by 1000mg  IV q12h Will follow renal function, cultures and clinical progress   Height: 5\' 6"  (167.6 cm) Weight: 160 lb 8 oz (72.8 kg) IBW/kg (Calculated) : 59.3  Temp (24hrs), Avg:98.1 F (36.7 C), Min:97.5 F (36.4 C), Max:98.4 F (36.9 C)  Recent Labs  Lab 04/07/17 1644 04/07/17 2343 04/09/17 0532 04/10/17 0333  WBC 16.7*  --   --  12.9*  CREATININE 1.05*  --  0.88 0.70  LATICACIDVEN  --  0.95  --   --     Estimated Creatinine Clearance: 63 mL/min (by C-G formula based on SCr of 0.7 mg/dL).    Allergies  Allergen Reactions  . Heparin Other (See Comments)    HIT  . Lovenox [Enoxaparin Sodium] Other (See Comments)    HIT  . Metoprolol Shortness Of Breath  . Percocet [Oxycodone-Acetaminophen] Nausea And Vomiting  . Prednisone Other (See Comments)    wheeze  . Sulfonamide Derivatives Rash    2/24 vanc 2/24 cefepime 2/24 azith 2/23 rocephin x1 2/21 cefepime x1 2/22 vanc 1250mg  x1  2/22 blood x2  Thank you for allowing pharmacy to be a part of this patient's care.  Hildred Laser, Pharm D 04/10/2017 10:27 AM

## 2017-04-10 NOTE — Progress Notes (Addendum)
Patient ID: Marissa Ponce, female   DOB: [birth year], 77 y.o.   MRN: [id number]                                                                PROGRESS NOTE                                                                                                                                                                                                             Patient Demographics:    Marissa Ponce, is a 77 y.o. female, DOB - 1940/09/13, PPI:[id number]  Admit date - 04/07/2017   Admitting Physician Lady Deutscher, MD  Outpatient Primary MD for the patient is Ronita Hipps, MD  LOS - 2  Outpatient Specialists     Chief Complaint  Patient presents with  . Shortness of Breath       Brief Narrative 77 y.o.femalewith medical history significant ofCHF,Afib,pulmonary embolism-on Eliquis, recent heart surgery forMVand TVplastyandMAZE (02/22/2017), migraine headaches who presented to the ED for evaluation of progressive shortness of breath.Her post op period was complicated by right sided stroke and seizures. Currently she has no residual right sided weakness and is maintained on Keppra. Patient complained of PND and orthopnea-sleeping on 4 pillows or at times even in recliner and exertional shortness of breath even with minimal exertion. Patient was seen by her PCP, underwent chest x-ray that showed vascular congestion and she was recommended to come to the ED for evaluation. She denied chest pain, cough, fever, abdominal pain, nausea vomiting, headache.   ED Course:On presentation to the ED patient was hypoxic with SPO2 showing 86% on room air, blood pressure was 102/54, heart rate 95,temperature 97.84F. Blood work demonstrated normal troponin 0 less than 0.03, lactic acid 0.95, CMP was unremarkable except creatinine 1.05, BNP 210.3,white blood cells count elevated to 16,700 Chest x-ray showed CHF and was suspicious for lower lung opacities that could be atelectasis versus  infection, bilateral small pleural effusions EKGdemonstrated sinus rhythm without acute ST-T wave changes      Subjective:    Marissa Ponce today still has dyspnea. Pt has been afebrile overnite.  Denies fever, chills, cp, palp, n/v, diarrhea, brbpr, blood.  Pt was even I/o yesterday. Plan on further diuresis.     Assessment  & Plan :  Principal Problem:   CHF exacerbation ([place]) Active Problems:   History of pulmonary embolism   A-fib (HCC)   Status post mitral valve repair   History of stroke   Seizure ([place])   Status post tricuspid valve repair   Pleural effusion   Acute on chronic diastolic heart failure (HCC)   SOB (shortness of breath)   HCAP (healthcare-associated pneumonia)   Essential hypertension     CHF exacerbation Last Echo at [facility] on 03/25/2017 showed EF 58% Per admitting physician, "doubt her SOB is secondary to infectious process thus will not continue antibiotic therapy" Continue Lasix as per cardiology Consider spironolactone Check cbc, cmp in am Cardiology consult appreciated  HCAP Blood culture Urine strep Urine legionella Rocephin/zithromax=> Vanco/cefepime   ? Pulmonary hypertension/ small pericardial effusion, small thoracic 4.1cm aa Cardiac echo pending  Pleural effusion (spoke with radiology 2/24, no thoracentesis) Will try to diurese  Status post mitral valve repair-had a surgery at [facility] on 02/22/2017 Last echo in 03/25/17 showed EFgreater than 55%, small LV, moderately enlarged RV Awaiting repeat echocardiogram,continue current medications  History of pulmonary embolus- Cont Eliquis as above  Atrial fibrillation- Underwent Maze procedure during her heart surgery, Amiodarone intolerant Currently in sinus rhythm,continue Cardizem CD 120mg  po qday  TIA/Seizure d/o Cont Keppra  Insomnia  Ambien 5mg  po qhs prn   DVT prophylaxis:SCD  H/o HIT=> AVOID HEPARIN PRODUCTS Code Status:Full Code Family  Communication:none Disposition Plan:Home once clinically improved Consults called:none Admission status:inpatient        Lab Results  Component Value Date   PLT 325 04/10/2017    Antibiotics  :  Rocephin/ zithromax 04/09/2017=>  Anti-infectives (From admission, onward)   Start     Dose/Rate Route Frequency Ordered Stop   04/10/17 2200  cefTRIAXone (ROCEPHIN) 1 g in sodium chloride 0.9 % 100 mL IVPB     1 g 200 mL/hr over 30 Minutes Intravenous Every 24 hours 04/09/17 2304     04/10/17 0000  azithromycin (ZITHROMAX) 500 mg in sodium chloride 0.9 % 250 mL IVPB     500 mg 250 mL/hr over 60 Minutes Intravenous Every 24 hours 04/09/17 2304     04/09/17 2315  cefTRIAXone (ROCEPHIN) 1 g in sodium chloride 0.9 % 100 mL IVPB     1 g 200 mL/hr over 30 Minutes Intravenous  Once 04/09/17 2307 04/10/17 0028   04/07/17 2330  ceFEPIme (MAXIPIME) 1 g in sodium chloride 0.9 % 100 mL IVPB     1 g 200 mL/hr over 30 Minutes Intravenous  Once 04/07/17 2315 04/08/17 0227   04/07/17 2330  vancomycin (VANCOCIN) 1,250 mg in sodium chloride 0.9 % 250 mL IVPB     1,250 mg 166.7 mL/hr over 90 Minutes Intravenous  Once 04/07/17 2318 04/08/17 0230        Objective:   Vitals:   04/09/17 1610 04/09/17 2039 04/10/17 0030 04/10/17 0609  BP:  117/77 118/65 138/72  Pulse:  (!) 104 95 94  Resp: 19 18 18 18   Temp:  98.4 F (36.9 C) 98.3 F (36.8 C) (!) 97.5 F (36.4 C)  TempSrc:  Oral Oral Oral  SpO2: 99% 98% 96% 95%  Weight:    72.8 kg (160 lb 8 oz)  Height:        Wt Readings from Last 3 Encounters:  04/10/17 72.8 kg (160 lb 8 oz)  05/16/15 89.1 kg (196 lb 6.4 oz)  05/12/15 89.1 kg (196 lb 6.4 oz)     Intake/Output Summary (  Last 24 hours) at 04/10/2017 0943 Last data filed at 04/10/2017 0615 Gross per 24 hour  Intake 1070 ml  Output 1150 ml  Net -80 ml     Physical Exam  Awake Alert, Oriented X 3, No new F.N deficits, Normal affect [place].AT,PERRAL Supple Neck,No JVD, No  cervical lymphadenopathy appriciated.  Symmetrical Chest wall movement, Good air movement bilaterally, decrease in bs , faint crackles right lung base, E=> A change right lung base.  RRR,No Gallops,Rubs or new Murmurs, No Parasternal Heave +ve B.Sounds, Abd Soft, No tenderness, No organomegaly appriciated, No rebound - guarding or rigidity. No Cyanosis, Clubbing or edema, No new Rash or bruise     Data Review:    CBC Recent Labs  Lab 04/07/17 1644 04/10/17 0333  WBC 16.7* 12.9*  HGB 12.1 11.7*  HCT 38.2 36.9  PLT 366 325  MCV 88.2 88.9  MCH 27.9 28.2  MCHC 31.7 31.7  RDW 15.3 15.4    Chemistries  Recent Labs  Lab 04/07/17 1644 04/09/17 0532 04/10/17 0333  NA 135 136 136  K 3.6 3.5 4.0  CL 90* 92* 94*  CO2 31 31 30   GLUCOSE 125* 129* 127*  BUN 15 12 12   CREATININE 1.05* 0.88 0.70  CALCIUM 9.3 9.0 9.0  AST  --   --  31  ALT  --   --  13*  ALKPHOS  --   --  113  BILITOT  --   --  0.2*   ------------------------------------------------------------------------------------------------------------------ No results for input(s): CHOL, HDL, LDLCALC, TRIG, CHOLHDL, LDLDIRECT in the last 72 hours.  No results found for: HGBA1C ------------------------------------------------------------------------------------------------------------------ No results for input(s): TSH, T4TOTAL, T3FREE, THYROIDAB in the last 72 hours.  Invalid input(s): FREET3 ------------------------------------------------------------------------------------------------------------------ No results for input(s): VITAMINB12, FOLATE, FERRITIN, TIBC, IRON, RETICCTPCT in the last 72 hours.  Coagulation profile Recent Labs  Lab 04/07/17 2327  INR 2.38    No results for input(s): DDIMER in the last 72 hours.  Cardiac Enzymes Recent Labs  Lab 04/08/17 0110 04/08/17 0658 04/08/17 1245  TROPONINI <0.03 <0.03 <0.03    ------------------------------------------------------------------------------------------------------------------    Component Value Date/Time   BNP 210.3 (H) 04/07/2017 1644    Inpatient Medications  Scheduled Meds: . apixaban  5 mg Oral BID  . diltiazem  120 mg Oral Daily  . furosemide  80 mg Intravenous BID  . levETIRAcetam  500 mg Oral Daily  . mouth rinse  15 mL Mouth Rinse BID  . metolazone  2.5 mg Oral Once  . potassium chloride  20 mEq Oral BID   Continuous Infusions: . azithromycin Stopped (04/10/17 0215)  . cefTRIAXone (ROCEPHIN)  IV     PRN Meds:.acetaminophen **OR** acetaminophen, acetaminophen, hydrALAZINE, ondansetron (ZOFRAN) IV, polyethylene glycol, promethazine, zolpidem  Micro Results Recent Results (from the past 240 hour(s))  Culture, blood (Routine X 2) w Reflex to ID Panel     Status: None (Preliminary result)   Collection Time: 04/08/17  3:45 AM  Result Value Ref Range Status   Specimen Description BLOOD LEFT HAND  Final   Special Requests IN PEDIATRIC BOTTLE Blood Culture adequate volume  Final   Culture   Final    NO GROWTH 1 DAY Performed at [facility], [place] [address], [place], [place] [address]    Report Status PENDING  Incomplete  Culture, blood (Routine X 2) w Reflex to ID Panel     Status: None (Preliminary result)   Collection Time: 04/08/17  3:50 AM  Result Value Ref  Range Status   Specimen Description BLOOD RIGHT HAND  Final   Special Requests IN PEDIATRIC BOTTLE Blood Culture adequate volume  Final   Culture   Final    NO GROWTH 1 DAY Performed at [facility], [place] [address] [place], [place] [address]    Report Status PENDING  Incomplete    Radiology Reports X-ray Chest Pa And Lateral  Result Date: 04/09/2017 CLINICAL DATA:  Shortness of breath. EXAM: CHEST - BILATERAL DECUBITUS VIEW; CHEST - 2 VIEW COMPARISON:  Chest x-ray dated April 07, 2017. FINDINGS: Stable cardiomegaly. Prior mitral and tricuspid valve  replacement. Persistent low lung volumes with slight interval increase in size of moderate left pleural effusion. Unchanged small right pleural effusion. Left greater than right basilar airspace disease. No pneumothorax. No acute osseous abnormality. IMPRESSION: 1. Minimal increase in size of moderate left pleural effusion. 2. Unchanged left greater than right basilar airspace disease. Electronically Signed   By: Titus Dubin M.D.   On: 04/09/2017 12:28   Dg Chest 2 View  Result Date: 04/07/2017 CLINICAL DATA:  Shortness of breath for 3 days EXAM: CHEST  2 VIEW COMPARISON:  08/28/2004 FINDINGS: Chronic cardiomegaly. Mitral and tricuspid valve repair. Enlarged main pulmonary artery. Low volume chest with small pleural effusions. There is generalized interstitial coarsening with fissure thickening. Lower lung opacities which could be atelectasis or infection. IMPRESSION: 1. Probable CHF. 2. Lower lung opacities could be atelectasis or infection. Electronically Signed   By: Monte Fantasia M.D.   On: 04/07/2017 17:56   Ct Chest Wo Contrast  Result Date: 04/09/2017 CLINICAL DATA:  Shortness of breath EXAM: CT CHEST WITHOUT CONTRAST TECHNIQUE: Multidetector CT imaging of the chest was performed following the standard protocol without IV contrast. COMPARISON:  Chest x-ray 04/09/2017, CT abdomen 05/12/2010 FINDINGS: Cardiovascular: Limited evaluation without intravenous contrast. Mild aneurysmal dilatation of the ascending aorta, measuring up to 4.1 cm. Mild aortic atherosclerosis. Enlarged pulmonary trunk measuring 3.5 cm. Cardiomegaly with valve prosthesis. Coronary artery calcification. Small pericardial effusion. Mediastinum/Nodes: Midline trachea. No thyroid mass. 11 mm lymph node anterior to the carina, with fatty hilus. Esophagus within normal limits. Lungs/Pleura: Moderate left pleural effusion, significant portion of which appears sub pulmonic though this is only partially visualized. Consolidation is  present within the bilateral lower lobes. No pneumothorax Upper Abdomen: No acute abnormality. Musculoskeletal: Degenerative changes. False appearance of sternal fracture due to respiratory motion IMPRESSION: 1. Moderate left pleural effusion, substantial portion of which appears to be sub pulmonic but this is incompletely visualized. 2. Bilateral lower lobe consolidations may reflect atelectasis pneumonia or aspiration 3. Cardiomegaly with small pericardial effusion 4. Ascending aortic aneurysm measuring up to 4.1 cm. Recommend annual imaging followup by CTA or MRA. This recommendation follows 2010 ACCF/AHA/AATS/ACR/ASA/SCA/SCAI/SIR/STS/SVM Guidelines for the Diagnosis and Management of Patients with Thoracic Aortic Disease. Circulation. 2010; 121: e266-e369 5. Enlarged pulmonary trunk raises possibility of pulmonary artery hypertension Aortic Atherosclerosis (ICD10-I70.0). Electronically Signed   By: Donavan Foil M.D.   On: 04/09/2017 21:56   Dg Chest Bilateral Decubitus  Result Date: 04/09/2017 CLINICAL DATA:  Shortness of breath. EXAM: CHEST - BILATERAL DECUBITUS VIEW; CHEST - 2 VIEW COMPARISON:  Chest x-ray dated April 07, 2017. FINDINGS: Stable cardiomegaly. Prior mitral and tricuspid valve replacement. Persistent low lung volumes with slight interval increase in size of moderate left pleural effusion. Unchanged small right pleural effusion. Left greater than right basilar airspace disease. No pneumothorax. No acute osseous abnormality. IMPRESSION: 1. Minimal increase in size of moderate left  pleural effusion. 2. Unchanged left greater than right basilar airspace disease. Electronically Signed   By: Titus Dubin M.D.   On: 04/09/2017 12:28    Time Spent in minutes  30   Jani Gravel M.D on 04/10/2017 at 9:43 AM  Between 7am to 7pm - Pager - [phone]    After 7pm go to [email or web address] - password [facility]  [facility] -  Office  [phone]

## 2017-04-11 ENCOUNTER — Other Ambulatory Visit (HOSPITAL_COMMUNITY): Payer: Medicare HMO

## 2017-04-11 ENCOUNTER — Encounter (HOSPITAL_COMMUNITY): Payer: Self-pay | Admitting: Radiology

## 2017-04-11 ENCOUNTER — Inpatient Hospital Stay (HOSPITAL_COMMUNITY): Payer: Medicare HMO

## 2017-04-11 DIAGNOSIS — I7121 Aneurysm of the ascending aorta, without rupture: Secondary | ICD-10-CM | POA: Diagnosis present

## 2017-04-11 DIAGNOSIS — I712 Thoracic aortic aneurysm, without rupture: Secondary | ICD-10-CM

## 2017-04-11 DIAGNOSIS — N179 Acute kidney failure, unspecified: Secondary | ICD-10-CM

## 2017-04-11 DIAGNOSIS — I5033 Acute on chronic diastolic (congestive) heart failure: Secondary | ICD-10-CM

## 2017-04-11 HISTORY — DX: Thoracic aortic aneurysm, without rupture: I71.2

## 2017-04-11 HISTORY — DX: Aneurysm of the ascending aorta, without rupture: I71.21

## 2017-04-11 HISTORY — PX: IR THORACENTESIS ASP PLEURAL SPACE W/IMG GUIDE: IMG5380

## 2017-04-11 LAB — ECHOCARDIOGRAM LIMITED
AVLVOTPG: 5 mmHg
Ao-asc: 39 cm
Area-P 1/2: 2.82 cm2
CHL CUP LVOT MV VTI INDEX: 0.52 cm2/m2
CHL CUP LVOT MV VTI: 0.95
CHL CUP MV M VEL: 141
FS: 32 % (ref 28–44)
Height: 66 in
IVS/LV PW RATIO, ED: 1
LADIAMINDEX: 2.69 cm/m2
LASIZE: 49 mm
LEFT ATRIUM END SYS DIAM: 49 mm
LV PW d: 9 mm — AB (ref 0.6–1.1)
LVOT VTI: 20.7 cm
LVOT area: 2.27 cm2
LVOT diameter: 17 mm
LVOT peak vel: 111 cm/s
LVOTSV: 47 mL
MV Annulus VTI: 49.4 cm
MV Peak grad: 17 mmHg
MV pk A vel: 162 m/s
MV pk E vel: 204 m/s
Mean grad: 9 mmHg
P 1/2 time: 78 ms
WEIGHTICAEL: 2556.8 [oz_av]

## 2017-04-11 LAB — GRAM STAIN

## 2017-04-11 LAB — BODY FLUID CELL COUNT WITH DIFFERENTIAL
EOS FL: 6 %
Lymphs, Fluid: 44 %
Monocyte-Macrophage-Serous Fluid: 7 % — ABNORMAL LOW (ref 50–90)
NEUTROPHIL FLUID: 43 % — AB (ref 0–25)
Total Nucleated Cell Count, Fluid: 803 cu mm (ref 0–1000)

## 2017-04-11 LAB — CBC
HCT: 36.9 % (ref 36.0–46.0)
Hemoglobin: 11.4 g/dL — ABNORMAL LOW (ref 12.0–15.0)
MCH: 27.8 pg (ref 26.0–34.0)
MCHC: 30.9 g/dL (ref 30.0–36.0)
MCV: 90 fL (ref 78.0–100.0)
PLATELETS: 337 10*3/uL (ref 150–400)
RBC: 4.1 MIL/uL (ref 3.87–5.11)
RDW: 15 % (ref 11.5–15.5)
WBC: 13.3 10*3/uL — AB (ref 4.0–10.5)

## 2017-04-11 LAB — BLOOD GAS, ARTERIAL
Acid-Base Excess: 13.1 mmol/L — ABNORMAL HIGH (ref 0.0–2.0)
Bicarbonate: 37.6 mmol/L — ABNORMAL HIGH (ref 20.0–28.0)
DRAWN BY: 519031
O2 Content: 2 L/min
O2 Saturation: 95.9 %
PATIENT TEMPERATURE: 97.5
PO2 ART: 73.3 mmHg — AB (ref 83.0–108.0)
pCO2 arterial: 50.7 mmHg — ABNORMAL HIGH (ref 32.0–48.0)
pH, Arterial: 7.48 — ABNORMAL HIGH (ref 7.350–7.450)

## 2017-04-11 LAB — COMPREHENSIVE METABOLIC PANEL
ALT: 12 U/L — AB (ref 14–54)
AST: 20 U/L (ref 15–41)
Albumin: 2.8 g/dL — ABNORMAL LOW (ref 3.5–5.0)
Alkaline Phosphatase: 97 U/L (ref 38–126)
Anion gap: 13 (ref 5–15)
BUN: 14 mg/dL (ref 6–20)
CHLORIDE: 94 mmol/L — AB (ref 101–111)
CO2: 33 mmol/L — AB (ref 22–32)
CREATININE: 0.91 mg/dL (ref 0.44–1.00)
Calcium: 9.3 mg/dL (ref 8.9–10.3)
GFR calc non Af Amer: >60 mL/min (ref >60)
Glucose, Bld: 137 mg/dL — ABNORMAL HIGH (ref 65–99)
POTASSIUM: 3.3 mmol/L — AB (ref 3.5–5.1)
SODIUM: 140 mmol/L (ref 135–145)
Total Bilirubin: 0.5 mg/dL (ref 0.3–1.2)
Total Protein: 6.3 g/dL — ABNORMAL LOW (ref 6.5–8.1)

## 2017-04-11 LAB — PROTEIN, PLEURAL OR PERITONEAL FLUID: Total protein, fluid: 4.2 g/dL

## 2017-04-11 LAB — LACTATE DEHYDROGENASE, PLEURAL OR PERITONEAL FLUID: LD, Fluid: 112 U/L — ABNORMAL HIGH (ref 3–23)

## 2017-04-11 LAB — LEGIONELLA PNEUMOPHILA SEROGP 1 UR AG: L. pneumophila Serogp 1 Ur Ag: NEGATIVE

## 2017-04-11 MED ORDER — AMOXICILLIN-POT CLAVULANATE 875-125 MG PO TABS
1.0000 | ORAL_TABLET | Freq: Two times a day (BID) | ORAL | Status: DC
  Administered 2017-04-11 – 2017-04-12 (×3): 1 via ORAL
  Filled 2017-04-11 (×3): qty 1

## 2017-04-11 MED ORDER — DOXYCYCLINE HYCLATE 100 MG PO TABS
100.0000 mg | ORAL_TABLET | Freq: Two times a day (BID) | ORAL | Status: DC
  Administered 2017-04-11 – 2017-04-12 (×3): 100 mg via ORAL
  Filled 2017-04-11 (×3): qty 1

## 2017-04-11 MED ORDER — LIDOCAINE 2% (20 MG/ML) 5 ML SYRINGE
INTRAMUSCULAR | Status: AC
  Filled 2017-04-11: qty 10

## 2017-04-11 MED ORDER — LIDOCAINE HCL 2 % IJ SOLN
INTRAMUSCULAR | Status: DC | PRN
  Administered 2017-04-11: 10 mL

## 2017-04-11 NOTE — Procedures (Signed)
PROCEDURE SUMMARY:  Trace effusion on right. Small to moderate effusion on left.  Successful US guided left thoracentesis. Yielded 520 mL of hazy, amber colored fluid. Pt tolerated procedure well. No immediate complications.  Specimen was sent for labs. CXR ordered.  Ascencion Dike PA-C 04/11/2017 1:29 PM

## 2017-04-11 NOTE — Progress Notes (Signed)
  Echocardiogram 2D Echocardiogram has been performed.  Johny Chess 04/11/2017, 2:48 PM

## 2017-04-11 NOTE — Progress Notes (Signed)
Progress Note    Marissa Ponce  JOI:[id number] DOB: August 28, 1940  DOA: 04/07/2017 PCP: Ronita Hipps, MD    Brief Narrative:   Chief complaint: Follow-up shortness of breath.  Medical records reviewed and are as summarized below:  Marissa Ponce is an 77 y.o. female with a PMH of CHF, atrial fibrillation, pulmonary embolism on chronic blood thinners status post recent mitral valve/tricuspid valve surgery and MAZE procedure on 02/22/17 who was admitted 04/07/17 for evaluation of progressive shortness of breath. On initial presentation, the patient was hypoxic with a room air saturation of 86%.  Assessment/Plan:   Principal Problem:   Acute on chronic diastolic CHF status post mitral valve and tricuspid valve repair on 02/22/17 Chest x-ray on admission showed probable CHF. BNP 210.3. Troponins negative 3. Repeat chest x-ray 04/09/17 showed moderate left-sided pleural effusion. CT subsequently done which showed bilateral lower lobe consolidations with atelectasis, pneumonia, or aspiration in the differential. Diuresing. Cardiology consulted. I/O balance -2.7 L over the past 24 hours. Weight down slightly. Follow-up 2-D echo. Prior echo 03/25/17 showed EF greater than 55%. Continue Lasix and Zaroxolyn. Patient says she is worse now than she has ever been. Will attempt thoracentesis today. I personally reviewed all imaging studies and chest x-ray from this morning does not show any significant improvement.  Active Problems:   Acute kidney injury Resolved. Creatinine 1.05 on admission, down to 0.7.    Hyperglycemia Blood glucose is mildly elevated. Check hemoglobin A1c.    Ascending aortic aneurysm Found incidentally on CT scan screening. 4.1 cm. Annual imaging recommended.    History of pulmonary embolism On chronic blood thinners. No evidence of recurrence.    A-fib [facility]) Admission EKG showed normal sinus rhythm. Status post MAZE. Continue Eliquis and Cardizem.    History of  stroke Continue risk factor modification.    Seizure ([place])  Continue Keppra.    Pleural effusion Not enough fluid to perform thoracentesis. Continue diuresis as tolerated.    HCAP (healthcare-associated pneumonia) Currently on cefepime and vancomycin. Afebrile. WBC remains elevated. Blood cultures pending. Strep pneumonia antigen negative. Legionella pending. Patient reports that she was evaluated by speech therapist within the past 2 months and found not to have any problems with aspiration. Given significant fluid intake with IV antibiotics, will change to Augmentin/doxycycline.    Essential hypertension Continue Cardizem, Lasix and Zaroxolyn. Blood pressure well controlled.   Family Communication/Anticipated D/C date and plan/Code Status   DVT prophylaxis: Eliquis ordered. Code Status: Full Code.  Family Communication: Multiple family including husband updated at the bedside. Disposition Plan: Home when symptoms improved, likely another 24-48 hours.   Medical Consultants:    Cardiology   Anti-Infectives:    Augmentin 04/11/17--->  Doxycycline 04/11/17--->  Vancomycin 04/08/17---> 04/11/17  Cefepime 04/07/17---> 04/11/17  Rocephin 04/09/17---> 04/10/17   Subjective:   The patient remains extremely short of breath and says it's the worst it has ever been. No significant cough. No subjective fevers. No nausea or vomiting.  Objective:    Vitals:   04/10/17 0609 04/10/17 0935 04/10/17 1217 04/11/17 0435  BP: 138/72 114/79 121/68 127/63  Pulse: 94 98 98 70  Resp: 18  20 20   Temp: (!) 97.5 F (36.4 C)  97.8 F (36.6 C) (!) 97.5 F (36.4 C)  TempSrc: Oral  Oral Oral  SpO2: 95%  100% 95%  Weight: 72.8 kg (160 lb 8 oz)   72.5 kg (159 lb 12.8 oz)  Height:  Intake/Output Summary (Last 24 hours) at 04/11/2017 0730 Last data filed at 04/11/2017 0457 Gross per 24 hour  Intake 770 ml  Output 3500 ml  Net -2730 ml   Filed Weights   04/09/17 0639 04/10/17 0609  04/11/17 0435  Weight: 72.6 kg (160 lb 1.6 oz) 72.8 kg (160 lb 8 oz) 72.5 kg (159 lb 12.8 oz)    Exam: General: Elderly female with dyspnea on exertion and at rest. Cardiovascular: Heart sounds currently regular with a 2/6 systolic murmur. Mild JVD. Lungs: Diminished bilaterally with a few bibasilar crackles. Abdomen: Soft, nontender, nondistended with normal active bowel sounds. No masses. No hepatosplenomegaly. Neurological: Alert and oriented 3. Moves all extremities 4 with equal strength. Cranial nerves II through XII grossly intact. Skin: Warm and dry. No rashes or lesions. Extremities: No clubbing or cyanosis. No edema. Pedal pulses 2+. Psychiatric: Mood and affect are normal. Insight and judgment are normal.   Data Reviewed:   I have personally reviewed following labs and imaging studies:  Labs: Labs show the following:   Basic Metabolic Panel: Recent Labs  Lab 04/07/17 1644 04/09/17 0532 04/10/17 0333  NA 135 136 136  K 3.6 3.5 4.0  CL 90* 92* 94*  CO2 31 31 30   GLUCOSE 125* 129* 127*  BUN 15 12 12   CREATININE 1.05* 0.88 0.70  CALCIUM 9.3 9.0 9.0   GFR Estimated Creatinine Clearance: 62.9 mL/min (by C-G formula based on SCr of 0.7 mg/dL). Liver Function Tests: Recent Labs  Lab 04/10/17 0333  AST 31  ALT 13*  ALKPHOS 113  BILITOT 0.2*  PROT 6.9  ALBUMIN 2.7*   Coagulation profile Recent Labs  Lab 04/07/17 2327  INR 2.38    CBC: Recent Labs  Lab 04/07/17 1644 04/10/17 0333 04/11/17 0613  WBC 16.7* 12.9* 13.3*  HGB 12.1 11.7* 11.4*  HCT 38.2 36.9 36.9  MCV 88.2 88.9 90.0  PLT 366 325 337   Cardiac Enzymes: Recent Labs  Lab 04/08/17 0110 04/08/17 0658 04/08/17 1245  TROPONINI <0.03 <0.03 <0.03   Sepsis Labs: Recent Labs  Lab 04/07/17 1644 04/07/17 2343 04/10/17 0333 04/11/17 0613  WBC 16.7*  --  12.9* 13.3*  LATICACIDVEN  --  0.95  --   --     Microbiology Recent Results (from the past 240 hour(s))  Culture, blood  (Routine X 2) w Reflex to ID Panel     Status: None (Preliminary result)   Collection Time: 04/08/17  3:45 AM  Result Value Ref Range Status   Specimen Description BLOOD LEFT HAND  Final   Special Requests IN PEDIATRIC BOTTLE Blood Culture adequate volume  Final   Culture   Final    NO GROWTH 2 DAYS Performed at [facility], [place] [address] [place], [place] [address]    Report Status PENDING  Incomplete  Culture, blood (Routine X 2) w Reflex to ID Panel     Status: None (Preliminary result)   Collection Time: 04/08/17  3:50 AM  Result Value Ref Range Status   Specimen Description BLOOD RIGHT HAND  Final   Special Requests IN PEDIATRIC BOTTLE Blood Culture adequate volume  Final   Culture   Final    NO GROWTH 2 DAYS Performed at [facility], [place] [address], [place], [place] [address]    Report Status PENDING  Incomplete    Procedures and diagnostic studies:  X-ray Chest Pa And Lateral  Result Date: 04/09/2017 CLINICAL DATA:  Shortness of breath. EXAM: CHEST -  BILATERAL DECUBITUS VIEW; CHEST - 2 VIEW COMPARISON:  Chest x-ray dated April 07, 2017. FINDINGS: Stable cardiomegaly. Prior mitral and tricuspid valve replacement. Persistent low lung volumes with slight interval increase in size of moderate left pleural effusion. Unchanged small right pleural effusion. Left greater than right basilar airspace disease. No pneumothorax. No acute osseous abnormality. IMPRESSION: 1. Minimal increase in size of moderate left pleural effusion. 2. Unchanged left greater than right basilar airspace disease. Electronically Signed   By: Titus Dubin M.D.   On: 04/09/2017 12:28   Ct Chest Wo Contrast  Result Date: 04/09/2017 CLINICAL DATA:  Shortness of breath EXAM: CT CHEST WITHOUT CONTRAST TECHNIQUE: Multidetector CT imaging of the chest was performed following the standard protocol without IV contrast. COMPARISON:  Chest x-ray 04/09/2017, CT abdomen 05/12/2010 FINDINGS: Cardiovascular:  Limited evaluation without intravenous contrast. Mild aneurysmal dilatation of the ascending aorta, measuring up to 4.1 cm. Mild aortic atherosclerosis. Enlarged pulmonary trunk measuring 3.5 cm. Cardiomegaly with valve prosthesis. Coronary artery calcification. Small pericardial effusion. Mediastinum/Nodes: Midline trachea. No thyroid mass. 11 mm lymph node anterior to the carina, with fatty hilus. Esophagus within normal limits. Lungs/Pleura: Moderate left pleural effusion, significant portion of which appears sub pulmonic though this is only partially visualized. Consolidation is present within the bilateral lower lobes. No pneumothorax Upper Abdomen: No acute abnormality. Musculoskeletal: Degenerative changes. False appearance of sternal fracture due to respiratory motion IMPRESSION: 1. Moderate left pleural effusion, substantial portion of which appears to be sub pulmonic but this is incompletely visualized. 2. Bilateral lower lobe consolidations may reflect atelectasis pneumonia or aspiration 3. Cardiomegaly with small pericardial effusion 4. Ascending aortic aneurysm measuring up to 4.1 cm. Recommend annual imaging followup by CTA or MRA. This recommendation follows 2010 ACCF/AHA/AATS/ACR/ASA/SCA/SCAI/SIR/STS/SVM Guidelines for the Diagnosis and Management of Patients with Thoracic Aortic Disease. Circulation. 2010; 121: e266-e369 5. Enlarged pulmonary trunk raises possibility of pulmonary artery hypertension Aortic Atherosclerosis (ICD10-I70.0). Electronically Signed   By: Donavan Foil M.D.   On: 04/09/2017 21:56   Dg Chest Bilateral Decubitus  Result Date: 04/09/2017 CLINICAL DATA:  Shortness of breath. EXAM: CHEST - BILATERAL DECUBITUS VIEW; CHEST - 2 VIEW COMPARISON:  Chest x-ray dated April 07, 2017. FINDINGS: Stable cardiomegaly. Prior mitral and tricuspid valve replacement. Persistent low lung volumes with slight interval increase in size of moderate left pleural effusion. Unchanged small right  pleural effusion. Left greater than right basilar airspace disease. No pneumothorax. No acute osseous abnormality. IMPRESSION: 1. Minimal increase in size of moderate left pleural effusion. 2. Unchanged left greater than right basilar airspace disease. Electronically Signed   By: Titus Dubin M.D.   On: 04/09/2017 12:28    Medications:   . apixaban  5 mg Oral BID  . diltiazem  120 mg Oral Daily  . feeding supplement  1 Container Oral TID BM  . feeding supplement (PRO-STAT SUGAR FREE 64)  30 mL Oral BID  . furosemide  80 mg Intravenous BID  . levETIRAcetam  500 mg Oral Daily  . mouth rinse  15 mL Mouth Rinse BID  . potassium chloride  20 mEq Oral BID   Continuous Infusions: . azithromycin Stopped (04/11/17 0116)  . ceFEPime (MAXIPIME) IV Stopped (04/11/17 0457)  . vancomycin Stopped (04/10/17 2250)     LOS: 3 days   Jacquelynn Cree  [facility] Pager [phone]. If unable to reach me by pager, please call my cell phone at [phone].  *Please refer to [email or web address], password [email or web address] to get updated schedule on  who will round on this patient, as hospitalists switch teams weekly. If 7PM-7AM, please contact night-coverage at [email or web address], password [email or web address] for any overnight needs.  04/11/2017, 7:30 AM

## 2017-04-11 NOTE — Progress Notes (Signed)
Progress Note  Patient Name: Marissa Ponce Date of Encounter: 04/11/2017  Primary Cardiologist: Richrd Humbles, MD   Subjective   Breathing is worse today. Diuresis increased yesterday - she was 2.7L negative on higher dose lasix and metolazone. Creatinine appears stable with diuresis. Hypokalemic today at 3.3. Weight recorded 1 lb less. She has had 3 admissions for dyspnea after her valve surgery at [facility] and was cardioverted for atrial flutter. She remains in sinus, however, dyspneic. Recent PE incidentally noted, however, she is on Eliquis - also noted to have HIT.  Inpatient Medications    Scheduled Meds: . apixaban  5 mg Oral BID  . diltiazem  120 mg Oral Daily  . feeding supplement  1 Container Oral TID BM  . feeding supplement (PRO-STAT SUGAR FREE 64)  30 mL Oral BID  . furosemide  80 mg Intravenous BID  . levETIRAcetam  500 mg Oral Daily  . mouth rinse  15 mL Mouth Rinse BID  . potassium chloride  20 mEq Oral BID   Continuous Infusions: . azithromycin Stopped (04/11/17 0116)  . ceFEPime (MAXIPIME) IV Stopped (04/11/17 0457)  . vancomycin Stopped (04/10/17 2250)   PRN Meds: acetaminophen **OR** acetaminophen, acetaminophen, hydrALAZINE, ondansetron (ZOFRAN) IV, polyethylene glycol, promethazine, zolpidem   Vital Signs    Vitals:   04/10/17 0935 04/10/17 1217 04/11/17 0435 04/11/17 0853  BP: 114/79 121/68 127/63 110/72  Pulse: 98 98 70 99  Resp:  20 20   Temp:  97.8 F (36.6 C) (!) 97.5 F (36.4 C)   TempSrc:  Oral Oral   SpO2:  100% 95% 95%  Weight:   159 lb 12.8 oz (72.5 kg)   Height:        Intake/Output Summary (Last 24 hours) at 04/11/2017 0901 Last data filed at 04/11/2017 0457 Gross per 24 hour  Intake 770 ml  Output 3500 ml  Net -2730 ml   Filed Weights   04/09/17 0639 04/10/17 0609 04/11/17 0435  Weight: 160 lb 1.6 oz (72.6 kg) 160 lb 8 oz (72.8 kg) 159 lb 12.8 oz (72.5 kg)    Telemetry    Sinus rhythm with 1st degree AVB -  Personally Reviewed  Physical Exam   General appearance: alert, mild distress and using accessory muscles Neck: JVD - 3 cm above sternal notch, no carotid bruit and thyroid not enlarged, symmetric, no tenderness/mass/nodules Lungs: diminished breath sounds LLL Heart: regular rate and rhythm and systolic murmur: early systolic 2/6, blowing at apex Abdomen: soft, non-tender; bowel sounds normal; no masses,  no organomegaly Extremities: extremities normal, atraumatic, no cyanosis or edema Pulses: 2+ and symmetric Skin: Skin color, texture, turgor normal. No rashes or lesions Neurologic: Grossly normal Psych: Mildly anxious  Labs    Chemistry Recent Labs  Lab 04/09/17 0532 04/10/17 0333 04/11/17 0613  NA 136 136 140  K 3.5 4.0 3.3*  CL 92* 94* 94*  CO2 31 30 33*  GLUCOSE 129* 127* 137*  BUN 12 12 14   CREATININE 0.88 0.70 0.91  CALCIUM 9.0 9.0 9.3  PROT  --  6.9 6.3*  ALBUMIN  --  2.7* 2.8*  AST  --  31 20  ALT  --  13* 12*  ALKPHOS  --  113 97  BILITOT  --  0.2* 0.5  GFRNONAA >60 >60 >60  GFRAA >60 >60 >60  ANIONGAP 13 12 13      Hematology Recent Labs  Lab 04/07/17 1644 04/10/17 0333 04/11/17 0613  WBC 16.7* 12.9* 13.3*  RBC  4.33 4.15 4.10  HGB 12.1 11.7* 11.4*  HCT 38.2 36.9 36.9  MCV 88.2 88.9 90.0  MCH 27.9 28.2 27.8  MCHC 31.7 31.7 30.9  RDW 15.3 15.4 15.0  PLT 366 325 337    Cardiac Enzymes Recent Labs  Lab 04/08/17 0110 04/08/17 0658 04/08/17 1245  TROPONINI <0.03 <0.03 <0.03    Recent Labs  Lab 04/07/17 1711  TROPIPOC 0.01     BNP Recent Labs  Lab 04/07/17 1644  BNP 210.3*     DDimer No results for input(s): DDIMER in the last 168 hours.   Radiology    Dg Chest 2 View  Result Date: 04/11/2017 CLINICAL DATA:  CHF, pneumonia, increasing shortness of breath. History of valvular heart disease, atrial fibrillation, ascending aortic aneurysm, bilateral pleural effusions, and former smoker. EXAM: CHEST  2 VIEW COMPARISON:  Chest x-ray  and chest CT scan of April 09, 2017. FINDINGS: The lungs are hypoinflated. There is bibasilar atelectasis or pneumonia with increase conspicuity of air bronchograms in the left lower lobe. There are bilateral pleural effusions layering posteriorly. The cardiac silhouette is enlarged. The patient has undergone previous mitral and tricuspid valve repair-replacement. A known ascending aortic aneurysm is not clearly evident on this plain radiograph. There is multilevel degenerative disc disease of the thoracic spine. IMPRESSION: Stable appearance of the chest. Low-grade CHF with small bilateral pleural effusions. Bibasilar atelectasis. Probable pneumonia in the left lower lobe. Electronically Signed   By: David  Martinique M.D.   On: 04/11/2017 07:36   X-ray Chest Pa And Lateral  Result Date: 04/09/2017 CLINICAL DATA:  Shortness of breath. EXAM: CHEST - BILATERAL DECUBITUS VIEW; CHEST - 2 VIEW COMPARISON:  Chest x-ray dated April 07, 2017. FINDINGS: Stable cardiomegaly. Prior mitral and tricuspid valve replacement. Persistent low lung volumes with slight interval increase in size of moderate left pleural effusion. Unchanged small right pleural effusion. Left greater than right basilar airspace disease. No pneumothorax. No acute osseous abnormality. IMPRESSION: 1. Minimal increase in size of moderate left pleural effusion. 2. Unchanged left greater than right basilar airspace disease. Electronically Signed   By: Titus Dubin M.D.   On: 04/09/2017 12:28   Ct Chest Wo Contrast  Result Date: 04/09/2017 CLINICAL DATA:  Shortness of breath EXAM: CT CHEST WITHOUT CONTRAST TECHNIQUE: Multidetector CT imaging of the chest was performed following the standard protocol without IV contrast. COMPARISON:  Chest x-ray 04/09/2017, CT abdomen 05/12/2010 FINDINGS: Cardiovascular: Limited evaluation without intravenous contrast. Mild aneurysmal dilatation of the ascending aorta, measuring up to 4.1 cm. Mild aortic  atherosclerosis. Enlarged pulmonary trunk measuring 3.5 cm. Cardiomegaly with valve prosthesis. Coronary artery calcification. Small pericardial effusion. Mediastinum/Nodes: Midline trachea. No thyroid mass. 11 mm lymph node anterior to the carina, with fatty hilus. Esophagus within normal limits. Lungs/Pleura: Moderate left pleural effusion, significant portion of which appears sub pulmonic though this is only partially visualized. Consolidation is present within the bilateral lower lobes. No pneumothorax Upper Abdomen: No acute abnormality. Musculoskeletal: Degenerative changes. False appearance of sternal fracture due to respiratory motion IMPRESSION: 1. Moderate left pleural effusion, substantial portion of which appears to be sub pulmonic but this is incompletely visualized. 2. Bilateral lower lobe consolidations may reflect atelectasis pneumonia or aspiration 3. Cardiomegaly with small pericardial effusion 4. Ascending aortic aneurysm measuring up to 4.1 cm. Recommend annual imaging followup by CTA or MRA. This recommendation follows 2010 ACCF/AHA/AATS/ACR/ASA/SCA/SCAI/SIR/STS/SVM Guidelines for the Diagnosis and Management of Patients with Thoracic Aortic Disease. Circulation. 2010; 121: e266-e369 5. Enlarged pulmonary trunk raises  possibility of pulmonary artery hypertension Aortic Atherosclerosis (ICD10-I70.0). Electronically Signed   By: Donavan Foil M.D.   On: 04/09/2017 21:56   Dg Chest Bilateral Decubitus  Result Date: 04/09/2017 CLINICAL DATA:  Shortness of breath. EXAM: CHEST - BILATERAL DECUBITUS VIEW; CHEST - 2 VIEW COMPARISON:  Chest x-ray dated April 07, 2017. FINDINGS: Stable cardiomegaly. Prior mitral and tricuspid valve replacement. Persistent low lung volumes with slight interval increase in size of moderate left pleural effusion. Unchanged small right pleural effusion. Left greater than right basilar airspace disease. No pneumothorax. No acute osseous abnormality. IMPRESSION: 1.  Minimal increase in size of moderate left pleural effusion. 2. Unchanged left greater than right basilar airspace disease. Electronically Signed   By: Titus Dubin M.D.   On: 04/09/2017 12:28    Cardiac Studies   N/A  Patient Profile     77 y.o. female with a hx of PE on Eliquis, persistent a fib with DCCV 03/28/17, Maze 02/22/17, S/p MV and TV ring repair 02/22/17, post op TIA with focal seizure and HITwho is being seen today for the evaluation of SOB and acute on chronic diastolic CHF at the request of Dr. Evangeline Gula.  Assessment & Plan    1. Acute on chronic diastolic heart failure: Breathing worse today -  Excellent diuresis with lasix and metolazone. Creatinine stable, but clinically worse dyspnea. Continue lasix 80 mg IV BID today. Will check a limited echo today to re-assess stability of valve repair and pulmonary pressure.  2. History of segmental PE: On Eliquis 5 mg bid. Doubt this is contributing to her dyspnea. Check ABG today. Recommend pulmonary consultation today for persistent dyspnea.  3. Persistent atrial fibrillation: Maintaining sinus rhythm. On long-acting diltiazem and apixaban. She felt that she was dyspneic with amiodarone and metoprolol.  4. S/p MV and TV repair: check limited echo today.  5. Community-acquired pneumonia: On IV ceftriaxone and azithromycin.  6. Hypertension: BP is controlled.   For questions or updates, please contact [place] Please consult [email or web address] for contact info under Cardiology/STEMI.   Pixie Casino, MD, [place], [place] Director of the [facility]  Card[facility] Diplomate of the American Board of Clinical Lipidology Attending Cardiologist  Direct Dial: [phone]  Fax: [phone]  Website:  [email or web address]  Pixie Casino, MD  04/11/2017, 9:01 AM

## 2017-04-11 NOTE — Plan of Care (Signed)
  Clinical Measurements: Ability to maintain clinical measurements within normal limits will improve 04/11/2017 2347 - Progressing by Taijon Vink, Roma Kayser, RN   Clinical Measurements: Diagnostic test results will improve 04/11/2017 2347 - Progressing by Tynia Wiers, Roma Kayser, RN   Clinical Measurements: Respiratory complications will improve 04/11/2017 2347 - Progressing by Frenchie Pribyl, Roma Kayser, RN

## 2017-04-12 DIAGNOSIS — R7303 Prediabetes: Secondary | ICD-10-CM | POA: Diagnosis present

## 2017-04-12 DIAGNOSIS — E876 Hypokalemia: Secondary | ICD-10-CM | POA: Diagnosis present

## 2017-04-12 HISTORY — DX: Hypokalemia: E87.6

## 2017-04-12 HISTORY — DX: Prediabetes: R73.03

## 2017-04-12 LAB — PATHOLOGIST SMEAR REVIEW

## 2017-04-12 LAB — BASIC METABOLIC PANEL
Anion gap: 15 (ref 5–15)
BUN: 21 mg/dL — AB (ref 6–20)
CHLORIDE: 89 mmol/L — AB (ref 101–111)
CO2: 36 mmol/L — AB (ref 22–32)
CREATININE: 1.04 mg/dL — AB (ref 0.44–1.00)
Calcium: 9.5 mg/dL (ref 8.9–10.3)
GFR calc Af Amer: 60 mL/min — ABNORMAL LOW (ref >60)
GFR calc non Af Amer: 52 mL/min — ABNORMAL LOW (ref >60)
Glucose, Bld: 100 mg/dL — ABNORMAL HIGH (ref 65–99)
Potassium: 3.1 mmol/L — ABNORMAL LOW (ref 3.5–5.1)
Sodium: 140 mmol/L (ref 135–145)

## 2017-04-12 LAB — HEMOGLOBIN A1C
Hgb A1c MFr Bld: 6 % — ABNORMAL HIGH (ref 4.8–5.6)
Mean Plasma Glucose: 126 mg/dL

## 2017-04-12 MED ORDER — TORSEMIDE 20 MG PO TABS
40.0000 mg | ORAL_TABLET | Freq: Two times a day (BID) | ORAL | Status: DC
  Administered 2017-04-12 – 2017-04-14 (×4): 40 mg via ORAL
  Filled 2017-04-12 (×4): qty 2

## 2017-04-12 MED ORDER — FUROSEMIDE 10 MG/ML IJ SOLN: 40.0000 mg | Freq: Two times a day (BID) | INTRAMUSCULAR | Status: DC

## 2017-04-12 MED ORDER — POTASSIUM CHLORIDE CRYS ER 20 MEQ PO TBCR
40.0000 meq | EXTENDED_RELEASE_TABLET | Freq: Once | ORAL | Status: AC
  Administered 2017-04-12: 40 meq via ORAL
  Filled 2017-04-12: qty 2

## 2017-04-12 NOTE — Progress Notes (Signed)
CM following for progression of care; see full consult 04/08/2017; pt is refusing [facility] services; Aneta Mins [phone]

## 2017-04-12 NOTE — Evaluation (Addendum)
Physical Therapy Evaluation & Discharge Patient Details Name: Marissa Ponce MRN: [id number] DOB: 03-09-1940 Today's Date: 04/12/2017   History of Present Illness  Pt is a 77 y.o. female admitted 04/07/17 with CHF exacerbation. CXR shows L pleural effusion with probably pneumonia; now s/p thoracentesis 2/25. Chest CT showed AAA. Recently underwent MVR, TVR, and MAZE procedure on 02/22/17 at [facility]. PMH includes a-fib, PE (on Eliquis), migraines, R TKA (2017).    Clinical Impression  Patient evaluated by Physical Therapy with no further acute PT needs identified. Pt currently indep with all mobility; limited by fatigue and SOB. Education on energy conservation, importance of continued mobility with current condition, and fall risk reduction. All education has been completed and the patient has no further questions. Will have 24/7 support from husband at d/c. PT is signing off. Thank you for this referral.    Follow Up Recommendations No PT follow up    Equipment Recommendations  None recommended by PT    Recommendations for Other Services       Precautions / Restrictions Precautions Precautions: None Restrictions Weight Bearing Restrictions: No      Mobility  Bed Mobility               General bed mobility comments: Received sitting in chair  Transfers Overall transfer level: Independent Equipment used: None                Ambulation/Gait Ambulation/Gait assistance: Independent Ambulation Distance (Feet): 150 Feet Assistive device: None Gait Pattern/deviations: Step-through pattern;Decreased stride length Gait velocity: Decreased Gait velocity interpretation: <1.8 ft/sec, indicative of risk for recurrent falls General Gait Details: Slow, slightly unsteady amb, progressing to indep with increasing distance. Pt declining further distance secondary to SOB  Stairs            Wheelchair Mobility    Modified Rankin (Stroke Patients Only)       Balance  Overall balance assessment: No apparent balance deficits (not formally assessed)                                           Pertinent Vitals/Pain Pain Assessment: No/denies pain    Home Living Family/patient expects to be discharged to:: Private residence Living Arrangements: Spouse/significant other Available Help at Discharge: Family;Available 24 hours/day Type of Home: House Home Access: Level entry     Home Layout: Able to live on main level with bedroom/bathroom;Two level Home Equipment: Walker - 2 wheels;Cane - single point      Prior Function Level of Independence: Independent         Comments: Has not gotten back to driving since recent heart sx in January. Retired Probation officer        Extremity/Trunk Assessment   Upper Extremity Assessment Upper Extremity Assessment: Overall WFL for tasks assessed    Lower Extremity Assessment Lower Extremity Assessment: Overall WFL for tasks assessed       Communication   Communication: No difficulties  Cognition Arousal/Alertness: Awake/alert Behavior During Therapy: WFL for tasks assessed/performed Overall Cognitive Status: Within Functional Limits for tasks assessed                                        General Comments      Exercises  Assessment/Plan    PT Assessment Patent does not need any further PT services  PT Problem List         PT Treatment Interventions      PT Goals (Current goals can be found in the Care Plan section)  Acute Rehab PT Goals PT Goal Formulation: All assessment and education complete, DC therapy    Frequency     Barriers to discharge        Co-evaluation               AM-PAC PT "6 Clicks" Daily Activity  Outcome Measure Difficulty turning over in bed (including adjusting bedclothes, sheets and blankets)?: None Difficulty moving from lying on back to sitting on the side of the bed? : None Difficulty sitting  down on and standing up from a chair with arms (e.g., wheelchair, bedside commode, etc,.)?: None Help needed moving to and from a bed to chair (including a wheelchair)?: None Help needed walking in hospital room?: None Help needed climbing 3-5 steps with a railing? : A Little 6 Click Score: 23    End of Session Equipment Utilized During Treatment: Gait belt Activity Tolerance: Patient tolerated treatment well Patient left: in chair;with call bell/phone within reach;with family/visitor present Nurse Communication: Mobility status PT Visit Diagnosis: Other abnormalities of gait and mobility (R26.89)    Time: 1540-0867 PT Time Calculation (min) (ACUTE ONLY): 14 min   Charges:   PT Evaluation $PT Eval Moderate Complexity: 1 Mod     PT G Codes:       Mabeline Caras, PT, DPT Acute Rehab Services  Pager: [place] 04/12/2017, 1:56 PM

## 2017-04-12 NOTE — Progress Notes (Signed)
Progress Note  Patient Name: Marissa Ponce Date of Encounter: 04/12/2017  Primary Cardiologist: Richrd Humbles, MD   Subjective   Feeling a bit better today, better than yesterday but still a bit short of breath with activity.   Inpatient Medications    Scheduled Meds: . amoxicillin-clavulanate  1 tablet Oral Q12H  . apixaban  5 mg Oral BID  . diltiazem  120 mg Oral Daily  . doxycycline  100 mg Oral Q12H  . feeding supplement  1 Container Oral TID BM  . feeding supplement (PRO-STAT SUGAR FREE 64)  30 mL Oral BID  . furosemide  40 mg Intravenous BID  . levETIRAcetam  500 mg Oral Daily  . mouth rinse  15 mL Mouth Rinse BID  . potassium chloride  20 mEq Oral BID   Continuous Infusions:  PRN Meds: acetaminophen **OR** acetaminophen, acetaminophen, hydrALAZINE, lidocaine, ondansetron (ZOFRAN) IV, polyethylene glycol, promethazine, zolpidem   Vital Signs    Vitals:   04/11/17 2147 04/11/17 2148 04/12/17 0520 04/12/17 0801  BP: 119/66  (!) 114/51 (!) 108/56  Pulse: (!) 108  95 99  Resp:   14   Temp: (!) 97.4 F (36.3 C)  (!) 97.4 F (36.3 C)   TempSrc: Oral  Oral   SpO2: (!) 83% 93% 95% 95%  Weight:   155 lb 9.6 oz (70.6 kg)   Height:        Intake/Output Summary (Last 24 hours) at 04/12/2017 0924 Last data filed at 04/12/2017 0801 Gross per 24 hour  Intake 240 ml  Output 3020 ml  Net -2780 ml   Filed Weights   04/10/17 0609 04/11/17 0435 04/12/17 0520  Weight: 160 lb 8 oz (72.8 kg) 159 lb 12.8 oz (72.5 kg) 155 lb 9.6 oz (70.6 kg)    Telemetry    NSR - Personally Reviewed  ECG    NSR - Personally Reviewed  Physical Exam   GEN: No acute distress.   Neck: No JVD Cardiac: RRR, no murmurs, rubs, or gallops.  Respiratory: decreased BS at the Rt base, left sided crackles present (do not clear w/ cough) GI: Soft, nontender, non-distended  MS: No edema; No deformity. Neuro:  Nonfocal  Psych: Normal affect   Labs    Chemistry Recent Labs  Lab  04/10/17 0333 04/11/17 0613 04/12/17 0457  NA 136 140 140  K 4.0 3.3* 3.1*  CL 94* 94* 89*  CO2 30 33* 36*  GLUCOSE 127* 137* 100*  BUN 12 14 21*  CREATININE 0.70 0.91 1.04*  CALCIUM 9.0 9.3 9.5  PROT 6.9 6.3*  --   ALBUMIN 2.7* 2.8*  --   AST 31 20  --   ALT 13* 12*  --   ALKPHOS 113 97  --   BILITOT 0.2* 0.5  --   GFRNONAA >60 >60 52*  GFRAA >60 >60 60*  ANIONGAP 12 13 15      Hematology Recent Labs  Lab 04/07/17 1644 04/10/17 0333 04/11/17 0613  WBC 16.7* 12.9* 13.3*  RBC 4.33 4.15 4.10  HGB 12.1 11.7* 11.4*  HCT 38.2 36.9 36.9  MCV 88.2 88.9 90.0  MCH 27.9 28.2 27.8  MCHC 31.7 31.7 30.9  RDW 15.3 15.4 15.0  PLT 366 325 337    Cardiac Enzymes Recent Labs  Lab 04/08/17 0110 04/08/17 0658 04/08/17 1245  TROPONINI <0.03 <0.03 <0.03    Recent Labs  Lab 04/07/17 1711  TROPIPOC 0.01     BNP Recent Labs  Lab 04/07/17 1644  BNP 210.3*     DDimer No results for input(s): DDIMER in the last 168 hours.   Radiology    Dg Chest 1 View  Result Date: 04/11/2017 CLINICAL DATA:  Status post thoracentesis. EXAM: CHEST 1 VIEW COMPARISON:  Radiographs of same day. FINDINGS: Stable cardiomegaly. Status post cardiac valve repair. No pneumothorax is noted. Stable bibasilar opacities are noted concerning for edema, atelectasis or possible infiltrate. Probable mild bilateral pleural effusions are noted. Bony thorax is unremarkable. IMPRESSION: Stable bibasilar opacities are noted, left greater than right, consistent with atelectasis, infiltrate or edema. Mild bilateral pleural effusions are probably present. No pneumothorax is noted. Electronically Signed   By: Marijo Conception, M.D.   On: 04/11/2017 13:52   Dg Chest 2 View  Result Date: 04/11/2017 CLINICAL DATA:  CHF, pneumonia, increasing shortness of breath. History of valvular heart disease, atrial fibrillation, ascending aortic aneurysm, bilateral pleural effusions, and former smoker. EXAM: CHEST  2 VIEW  COMPARISON:  Chest x-ray and chest CT scan of April 09, 2017. FINDINGS: The lungs are hypoinflated. There is bibasilar atelectasis or pneumonia with increase conspicuity of air bronchograms in the left lower lobe. There are bilateral pleural effusions layering posteriorly. The cardiac silhouette is enlarged. The patient has undergone previous mitral and tricuspid valve repair-replacement. A known ascending aortic aneurysm is not clearly evident on this plain radiograph. There is multilevel degenerative disc disease of the thoracic spine. IMPRESSION: Stable appearance of the chest. Low-grade CHF with small bilateral pleural effusions. Bibasilar atelectasis. Probable pneumonia in the left lower lobe. Electronically Signed   By: David  Martinique M.D.   On: 04/11/2017 07:36   Ir Thoracentesis Asp Pleural Space W/img Guide  Result Date: 04/11/2017 INDICATION: Shortness of breath. Left-sided pleural effusion. Request diagnostic and therapeutic thoracentesis. EXAM: ULTRASOUND GUIDED LEFT THORACENTESIS MEDICATIONS: None. COMPLICATIONS: None immediate. Postprocedural chest x-ray negative for pneumothorax. PROCEDURE: An ultrasound guided thoracentesis was thoroughly discussed with the patient and questions answered. The benefits, risks, alternatives and complications were also discussed. The patient understands and wishes to proceed with the procedure. Written consent was obtained. Ultrasound was performed to localize and mark an adequate pocket of fluid in the left chest. The area was then prepped and draped in the normal sterile fashion. 1% Lidocaine was used for local anesthesia. Under ultrasound guidance a Safe-T-Centesis catheter was introduced. Thoracentesis was performed. The catheter was removed and a dressing applied. FINDINGS: A total of approximately 520 mL of hazy, amber colored fluid was removed. Samples were sent to the laboratory as requested by the clinical team. IMPRESSION: Successful ultrasound guided  left thoracentesis yielding 520 mL of pleural fluid. Read by: Ascencion Dike PA-C Electronically Signed   By: Corrie Mckusick D.O.   On: 04/11/2017 13:54    Cardiac Studies   2D Echo 04/11/17 Study Conclusions  - Left ventricle: The cavity size was normal. Wall thickness was   normal. Systolic function was normal. The estimated ejection   fraction was in the range of 50% to 55%. - Aortic valve: There was trivial regurgitation. - Mitral valve: S/P MV repair with 28 mm annuloplasty ring. MV   leaflets are thickened. Peak and mean gradients through the valve   are 16 and 9 mm Hg respectively. This is mildly increased from   gradients seen at echo at [facility] in Jan 2019 Valve area by   continuity equation (using LVOT flow): 0.95 cm^2. - Left atrium: The atrium was moderately dilated. - Right ventricle: The cavity size was mildly dilated.  Systolic   function was mildly to moderately reduced. - Tricuspid valve: s/p TV repair with annuloplasty ring . TV   leaflelts are normal. - Pericardium, extracardiac: A small pericardial effusion was   identified.  Impressions:  - Compared to echo report from [facility] (Jan 2019) there does not   appear to be signficant change.  Patient Profile     77 y.o. female with a hx of PE on Eliquis, persistent a fib with DCCV 03/28/17, Maze 02/22/17, S/p MV and TV ring repair 02/22/17, post op TIA with focal seizure and HITwho is being seen today for the evaluation of SOBand acute on chronic diastolic CHFat the request of Dr. Evangeline Gula.   Assessment & Plan    1. Acute on Chronic Diastolic CHF: EF normal by echo, 50-55%. Diuresing well with Lasix. I/Os net negative 7L since admit. Slight bump in SCr from 0.91>>1.04. She is hypokalemic at 3.1 and needs supplementation. She still has left sided crackles on exam and still a bit short of breath. Will continue IV diuretic therapy. Case was discussed with primary care team. Given bump in SCr, will scale down lasix to once  daily. Continue to follow I/os and renal function.    2. History of Segmental PE: on Eliquis 5 mg BID.   3. Persistent Atrial Fibrillation: Maintaining NSR on tele. Rate is controlled with Cardizem. Continue Eliquis for a/c.   4. S/p MV and TV Repair: performed at [facility] 02/22/17. Echo 04/11/17 showed MV leaflets are thickened. Peak and mean gradients through the valve are 16 and 9 mm Hg respectively. This is mildly increased from gradients seen at echo at [facility] in Jan 2019 Valve area by continuity equation (using LVOT flow): 0.95 cm^2. TV functioning properly. Compared to echo report from [facility] (Jan 2019) there does not appear to be signficant change.  5. ? CAP: this is now doubtful. Pt was initially placed on antibiotics. However in discussion with primary care team, it is felt that she does not have pneumonia and dyspnea is primarily 2/2 acute on chronic diastolic HF. Antibiotics have been discontinued.    6. HTN: controlled on current regimen.   7. H/o HIT: Heparin listed in allergy list.   8. Ascending Aortic Aneurysm: noted on chest CT 04/09/17. 4.1 cm. Pt will need annual imaging.   9. Pleural Effusion: s/p successful ultrasound guided left thoracentesis 04/11/17 yielding 520 mL of pleural fluid.  10. Hypokalemia: 3.1 today, in the setting of IV diuretic therapy. Give supplemental K for correction.    For questions or updates, please contact [place] Please consult [email or web address] for contact info under Cardiology/STEMI.      Signed, Lyda Jester, PA-C  04/12/2017, 9:24 AM

## 2017-04-12 NOTE — Progress Notes (Signed)
Progress Note    PAULETT KAUFHOLD  WIO:[id number] DOB: Aug 29, 1940  DOA: 04/07/2017 PCP: Ronita Hipps, MD    Brief Narrative:   Chief complaint: Follow-up shortness of breath.  Medical records reviewed and are as summarized below:  Marissa Ponce is an 77 y.o. female with a PMH of CHF, atrial fibrillation, pulmonary embolism on chronic blood thinners status post recent mitral valve/tricuspid valve surgery and MAZE procedure on 02/22/17 who was admitted 04/07/17 for evaluation of progressive shortness of breath. On initial presentation, the patient was hypoxic with a room air saturation of 86%.  Assessment/Plan:   Principal Problem:   Acute respiratory failure secondary to acute on chronic diastolic CHF status post mitral valve and tricuspid valve repair on 02/22/17 Chest x-ray on admission showed probable CHF. BNP 210.3. Troponins negative 3. Repeat chest x-ray 04/09/17 showed moderate left-sided pleural effusion. CT subsequently done which showed bilateral lower lobe consolidations with atelectasis, pneumonia, or aspiration in the differential. 2-D echo done 04/11/17, findings fully reviewed and showed an EF of 50-55%, no significant change from prior.  Underwent thoracentesis 04/11/17 with 520 cc of hazy fluid removed.  Cell count showed 803 WBCs.  LDH 112. Diuresing well on Lasix and Zaroxolyn.   I/O balance -2.7 L over the past 24 hours. Weight down 4 lbs. Decrease Lasix to 40 mg twice daily given bump in creatinine.  Active Problems:   Hypokalemia Already on supplementation.  We will give an additional 40 mEq of potassium now.    Acute kidney injury Resolved.  Creatinine bump noted, likely due to aggressive diuresis.  Will decrease Lasix dose.    Hyperglycemia/prediabetes Hemoglobin A1c is 6%, corresponding to a mean plasma glucose of 126. Counseled about findings.    Ascending aortic aneurysm Found incidentally on CT scan screening. 4.1 cm. Annual imaging recommended.  Discussed  findings and need for annual follow-up with the patient and her family.    History of pulmonary embolism On chronic blood thinners. No evidence of recurrence.    A-fib [facility]) Admission EKG showed normal sinus rhythm. Status post MAZE. Continue Eliquis and Cardizem.    History of stroke Continue risk factor modification.    Seizure ([place])  Continue Keppra.    Left Pleural effusion S/P 520 cc thoracentesis. F/U cultures. Continue diuresis as tolerated.    HCAP (healthcare-associated pneumonia) Antibiotics switched to oral Augmentin/doxycycline 04/11/17.  Remains afebrile. Blood cultures negative to date. Strep pneumonia antigen and Legionella negative.  Patient reports that she was evaluated by speech therapist within the past 2 months and found not to have any problems with aspiration.  Follow-up pleural fluid cultures. D/C antibiotics.     Essential hypertension Continue Cardizem, Lasix and Zaroxolyn. Blood pressure well controlled.   Family Communication/Anticipated D/C date and plan/Code Status   DVT prophylaxis: Eliquis ordered. Code Status: Full Code.  Family Communication: Multiple family including husband updated at the bedside. Disposition Plan: Home when symptoms improved, likely another 24 hours.   Medical Consultants:    Cardiology   Anti-Infectives:    Augmentin 04/11/17--->04/12/17  Doxycycline 04/11/17--->04/12/17  Vancomycin 04/08/17---> 04/11/17  Cefepime 04/07/17---> 04/11/17  Rocephin 04/09/17---> 04/10/17   Subjective:   The patient feels less short of breath, but still dyspneic at times. Needed to wear oxygen last night. No chest pain. No nausea. Appetite fair.  Objective:    Vitals:   04/11/17 2147 04/11/17 2148 04/12/17 0520 04/12/17 0801  BP: 119/66  (!) 114/51 (!) 108/56  Pulse: (!) 108  95 99  Resp:   14   Temp: (!) 97.4 F (36.3 C)  (!) 97.4 F (36.3 C)   TempSrc: Oral  Oral   SpO2: (!) 83% 93% 95% 95%  Weight:   70.6 kg (155 lb 9.6 oz)    Height:        Intake/Output Summary (Last 24 hours) at 04/12/2017 0830 Last data filed at 04/12/2017 0801 Gross per 24 hour  Intake 240 ml  Output 3020 ml  Net -2780 ml   Filed Weights   04/10/17 0609 04/11/17 0435 04/12/17 0520  Weight: 72.8 kg (160 lb 8 oz) 72.5 kg (159 lb 12.8 oz) 70.6 kg (155 lb 9.6 oz)    Exam: General: No acute distress. Mildly anxious. Cardiovascular: Heart sounds show a regular rate, and rhythm. No gallops or rubs. No murmurs. No JVD. Lungs: Crackles left base, diminished at the bases. Abdomen: Soft, nontender, nondistended with normal active bowel sounds. No masses. No hepatosplenomegaly. Neurological: Alert and oriented 3. Moves all extremities 4 with equal strength. Cranial nerves II through XII grossly intact. Skin: Warm and dry. No rashes or lesions. Extremities: No clubbing or cyanosis. No edema. Pedal pulses 2+. Psychiatric: Mood and affect are mildly anxious. Insight and judgment are fair.   Data Reviewed:   I have personally reviewed following labs and imaging studies:  Labs: Labs show the following:   Basic Metabolic Panel: Recent Labs  Lab 04/07/17 1644 04/09/17 0532 04/10/17 0333 04/11/17 0613 04/12/17 0457  NA 135 136 136 140 140  K 3.6 3.5 4.0 3.3* 3.1*  CL 90* 92* 94* 94* 89*  CO2 31 31 30  33* 36*  GLUCOSE 125* 129* 127* 137* 100*  BUN 15 12 12 14  21*  CREATININE 1.05* 0.88 0.70 0.91 1.04*  CALCIUM 9.3 9.0 9.0 9.3 9.5   GFR Estimated Creatinine Clearance: 44.4 mL/min (A) (by C-G formula based on SCr of 1.04 mg/dL (H)). Liver Function Tests: Recent Labs  Lab 04/10/17 0333 04/11/17 0613  AST 31 20  ALT 13* 12*  ALKPHOS 113 97  BILITOT 0.2* 0.5  PROT 6.9 6.3*  ALBUMIN 2.7* 2.8*   Coagulation profile Recent Labs  Lab 04/07/17 2327  INR 2.38    CBC: Recent Labs  Lab 04/07/17 1644 04/10/17 0333 04/11/17 0613  WBC 16.7* 12.9* 13.3*  HGB 12.1 11.7* 11.4*  HCT 38.2 36.9 36.9  MCV 88.2 88.9 90.0  PLT  366 325 337   Cardiac Enzymes: Recent Labs  Lab 04/08/17 0110 04/08/17 0658 04/08/17 1245  TROPONINI <0.03 <0.03 <0.03   Sepsis Labs: Recent Labs  Lab 04/07/17 1644 04/07/17 2343 04/10/17 0333 04/11/17 0613  WBC 16.7*  --  12.9* 13.3*  LATICACIDVEN  --  0.95  --   --     Microbiology Recent Results (from the past 240 hour(s))  Culture, blood (Routine X 2) w Reflex to ID Panel     Status: None (Preliminary result)   Collection Time: 04/08/17  3:45 AM  Result Value Ref Range Status   Specimen Description BLOOD LEFT HAND  Final   Special Requests IN PEDIATRIC BOTTLE Blood Culture adequate volume  Final   Culture   Final    NO GROWTH 3 DAYS Performed at [facility], [place] [address] [place], [place] [address]    Report Status PENDING  Incomplete  Culture, blood (Routine X 2) w Reflex to ID Panel     Status: None (Preliminary result)   Collection Time: 04/08/17  3:50 AM  Result Value Ref Range Status   Specimen Description BLOOD RIGHT HAND  Final   Special Requests IN PEDIATRIC BOTTLE Blood Culture adequate volume  Final   Culture   Final    NO GROWTH 3 DAYS Performed at [facility], [address] [place], [place] [address]    Report Status PENDING  Incomplete  Gram stain     Status: None   Collection Time: 04/11/17  1:44 PM  Result Value Ref Range Status   Specimen Description FLUID PLEURAL LEFT  Final   Special Requests NONE  Final   Gram Stain   Final    MODERATE WBC PRESENT,BOTH PMN AND MONONUCLEAR NO ORGANISMS SEEN Performed at [facility], [address] [place], [place] [address]    Report Status 04/11/2017 FINAL  Final    Procedures and diagnostic studies:  Dg Chest 1 View  Result Date: 04/11/2017 CLINICAL DATA:  Status post thoracentesis. EXAM: CHEST 1 VIEW COMPARISON:  Radiographs of same day. FINDINGS: Stable cardiomegaly. Status post cardiac valve repair. No pneumothorax is noted. Stable bibasilar opacities are noted concerning  for edema, atelectasis or possible infiltrate. Probable mild bilateral pleural effusions are noted. Bony thorax is unremarkable. IMPRESSION: Stable bibasilar opacities are noted, left greater than right, consistent with atelectasis, infiltrate or edema. Mild bilateral pleural effusions are probably present. No pneumothorax is noted. Electronically Signed   By: Marijo Conception, M.D.   On: 04/11/2017 13:52   Dg Chest 2 View  Result Date: 04/11/2017 CLINICAL DATA:  CHF, pneumonia, increasing shortness of breath. History of valvular heart disease, atrial fibrillation, ascending aortic aneurysm, bilateral pleural effusions, and former smoker. EXAM: CHEST  2 VIEW COMPARISON:  Chest x-ray and chest CT scan of April 09, 2017. FINDINGS: The lungs are hypoinflated. There is bibasilar atelectasis or pneumonia with increase conspicuity of air bronchograms in the left lower lobe. There are bilateral pleural effusions layering posteriorly. The cardiac silhouette is enlarged. The patient has undergone previous mitral and tricuspid valve repair-replacement. A known ascending aortic aneurysm is not clearly evident on this plain radiograph. There is multilevel degenerative disc disease of the thoracic spine. IMPRESSION: Stable appearance of the chest. Low-grade CHF with small bilateral pleural effusions. Bibasilar atelectasis. Probable pneumonia in the left lower lobe. Electronically Signed   By: David  Martinique M.D.   On: 04/11/2017 07:36   Ir Thoracentesis Asp Pleural Space W/img Guide  Result Date: 04/11/2017 INDICATION: Shortness of breath. Left-sided pleural effusion. Request diagnostic and therapeutic thoracentesis. EXAM: ULTRASOUND GUIDED LEFT THORACENTESIS MEDICATIONS: None. COMPLICATIONS: None immediate. Postprocedural chest x-ray negative for pneumothorax. PROCEDURE: An ultrasound guided thoracentesis was thoroughly discussed with the patient and questions answered. The benefits, risks, alternatives and  complications were also discussed. The patient understands and wishes to proceed with the procedure. Written consent was obtained. Ultrasound was performed to localize and mark an adequate pocket of fluid in the left chest. The area was then prepped and draped in the normal sterile fashion. 1% Lidocaine was used for local anesthesia. Under ultrasound guidance a Safe-T-Centesis catheter was introduced. Thoracentesis was performed. The catheter was removed and a dressing applied. FINDINGS: A total of approximately 520 mL of hazy, amber colored fluid was removed. Samples were sent to the laboratory as requested by the clinical team. IMPRESSION: Successful ultrasound guided left thoracentesis yielding 520 mL of pleural fluid. Read by: Ascencion Dike PA-C Electronically Signed   By: Corrie Mckusick D.O.   On: 04/11/2017 13:54    Medications:   .  amoxicillin-clavulanate  1 tablet Oral Q12H  . apixaban  5 mg Oral BID  . diltiazem  120 mg Oral Daily  . doxycycline  100 mg Oral Q12H  . feeding supplement  1 Container Oral TID BM  . feeding supplement (PRO-STAT SUGAR FREE 64)  30 mL Oral BID  . furosemide  80 mg Intravenous BID  . levETIRAcetam  500 mg Oral Daily  . mouth rinse  15 mL Mouth Rinse BID  . potassium chloride  20 mEq Oral BID   Continuous Infusions:    LOS: 4 days   Jacquelynn Cree  [facility] Pager [phone]. If unable to reach me by pager, please call my cell phone at [phone].  *Please refer to [email or web address], password [email or web address] to get updated schedule on who will round on this patient, as hospitalists switch teams weekly. If 7PM-7AM, please contact night-coverage at [email or web address], password [email or web address] for any overnight needs.  04/12/2017, 8:30 AM

## 2017-04-12 NOTE — Progress Notes (Signed)
[place] offered pt stated that she took one last night.

## 2017-04-13 ENCOUNTER — Inpatient Hospital Stay (HOSPITAL_COMMUNITY): Payer: Medicare HMO

## 2017-04-13 LAB — CULTURE, BLOOD (ROUTINE X 2)
Culture: NO GROWTH
Culture: NO GROWTH
SPECIAL REQUESTS: ADEQUATE
SPECIAL REQUESTS: ADEQUATE

## 2017-04-13 LAB — BASIC METABOLIC PANEL
ANION GAP: 14 (ref 5–15)
BUN: 23 mg/dL — ABNORMAL HIGH (ref 6–20)
CHLORIDE: 89 mmol/L — AB (ref 101–111)
CO2: 36 mmol/L — AB (ref 22–32)
Calcium: 9.5 mg/dL (ref 8.9–10.3)
Creatinine, Ser: 1.04 mg/dL — ABNORMAL HIGH (ref 0.44–1.00)
GFR calc Af Amer: 60 mL/min — ABNORMAL LOW (ref >60)
GFR calc non Af Amer: 52 mL/min — ABNORMAL LOW (ref >60)
GLUCOSE: 134 mg/dL — AB (ref 65–99)
POTASSIUM: 3.3 mmol/L — AB (ref 3.5–5.1)
Sodium: 139 mmol/L (ref 135–145)

## 2017-04-13 MED ORDER — POTASSIUM CHLORIDE CRYS ER 20 MEQ PO TBCR
40.0000 meq | EXTENDED_RELEASE_TABLET | Freq: Once | ORAL | Status: AC
  Administered 2017-04-13: 40 meq via ORAL
  Filled 2017-04-13: qty 2

## 2017-04-13 NOTE — Progress Notes (Signed)
Progress Note  Patient Name: Marissa Ponce Date of Encounter: 04/13/2017  Primary Cardiologist: Richrd Humbles, MD   Subjective   Pt notes she feels the same as yesterday. No much improvement. She still gets SOB walking around her room. No resting dyspnea.   Inpatient Medications    Scheduled Meds: . apixaban  5 mg Oral BID  . diltiazem  120 mg Oral Daily  . feeding supplement  1 Container Oral TID BM  . feeding supplement (PRO-STAT SUGAR FREE 64)  30 mL Oral BID  . levETIRAcetam  500 mg Oral Daily  . mouth rinse  15 mL Mouth Rinse BID  . potassium chloride  20 mEq Oral BID  . potassium chloride  40 mEq Oral Once  . torsemide  40 mg Oral BID   Continuous Infusions:  PRN Meds: acetaminophen **OR** acetaminophen, acetaminophen, hydrALAZINE, lidocaine, ondansetron (ZOFRAN) IV, polyethylene glycol, promethazine, zolpidem   Vital Signs    Vitals:   04/12/17 1709 04/12/17 2019 04/13/17 0609 04/13/17 0751  BP: (!) 105/53 114/63 (!) 100/54 (!) 114/56  Pulse: 100 (!) 106 (!) 101 (!) 102  Resp:  18 18   Temp:  98.3 F (36.8 C) 97.6 F (36.4 C)   TempSrc:  Oral Oral   SpO2:  94% 92% 92%  Weight:   154 lb 4.8 oz (70 kg)   Height:        Intake/Output Summary (Last 24 hours) at 04/13/2017 0850 Last data filed at 04/13/2017 0610 Gross per 24 hour  Intake 480 ml  Output 1150 ml  Net -670 ml   Filed Weights   04/11/17 0435 04/12/17 0520 04/13/17 0609  Weight: 159 lb 12.8 oz (72.5 kg) 155 lb 9.6 oz (70.6 kg) 154 lb 4.8 oz (70 kg)    Telemetry    Sinus tach, low 100s.  - Personally Reviewed  ECG    Sinus tach 103 bpm - Personally Reviewed  Physical Exam   GEN: No acute distress.   Neck: No JVD Cardiac: Reg rhythm, tachy rate no murmurs, rubs, or gallops.  Respiratory: faint crackles at the LLL, sounds improved from yesterday GI: Soft, nontender, non-distended  MS: No edema; No deformity. Neuro:  Nonfocal  Psych: Normal affect   Labs     Chemistry Recent Labs  Lab 04/10/17 0333 04/11/17 0613 04/12/17 0457 04/13/17 0444  NA 136 140 140 139  K 4.0 3.3* 3.1* 3.3*  CL 94* 94* 89* 89*  CO2 30 33* 36* 36*  GLUCOSE 127* 137* 100* 134*  BUN 12 14 21* 23*  CREATININE 0.70 0.91 1.04* 1.04*  CALCIUM 9.0 9.3 9.5 9.5  PROT 6.9 6.3*  --   --   ALBUMIN 2.7* 2.8*  --   --   AST 31 20  --   --   ALT 13* 12*  --   --   ALKPHOS 113 97  --   --   BILITOT 0.2* 0.5  --   --   GFRNONAA >60 >60 52* 52*  GFRAA >60 >60 60* 60*  ANIONGAP 12 13 15 14      Hematology Recent Labs  Lab 04/07/17 1644 04/10/17 0333 04/11/17 0613  WBC 16.7* 12.9* 13.3*  RBC 4.33 4.15 4.10  HGB 12.1 11.7* 11.4*  HCT 38.2 36.9 36.9  MCV 88.2 88.9 90.0  MCH 27.9 28.2 27.8  MCHC 31.7 31.7 30.9  RDW 15.3 15.4 15.0  PLT 366 325 337    Cardiac Enzymes Recent Labs  Lab 04/08/17 0110  04/08/17 0658 04/08/17 1245  TROPONINI <0.03 <0.03 <0.03    Recent Labs  Lab 04/07/17 1711  TROPIPOC 0.01     BNP Recent Labs  Lab 04/07/17 1644  BNP 210.3*     DDimer No results for input(s): DDIMER in the last 168 hours.   Radiology    Dg Chest 1 View  Result Date: 04/11/2017 CLINICAL DATA:  Status post thoracentesis. EXAM: CHEST 1 VIEW COMPARISON:  Radiographs of same day. FINDINGS: Stable cardiomegaly. Status post cardiac valve repair. No pneumothorax is noted. Stable bibasilar opacities are noted concerning for edema, atelectasis or possible infiltrate. Probable mild bilateral pleural effusions are noted. Bony thorax is unremarkable. IMPRESSION: Stable bibasilar opacities are noted, left greater than right, consistent with atelectasis, infiltrate or edema. Mild bilateral pleural effusions are probably present. No pneumothorax is noted. Electronically Signed   By: Marijo Conception, M.D.   On: 04/11/2017 13:52   Ir Thoracentesis Asp Pleural Space W/img Guide  Result Date: 04/11/2017 INDICATION: Shortness of breath. Left-sided pleural effusion. Request  diagnostic and therapeutic thoracentesis. EXAM: ULTRASOUND GUIDED LEFT THORACENTESIS MEDICATIONS: None. COMPLICATIONS: None immediate. Postprocedural chest x-ray negative for pneumothorax. PROCEDURE: An ultrasound guided thoracentesis was thoroughly discussed with the patient and questions answered. The benefits, risks, alternatives and complications were also discussed. The patient understands and wishes to proceed with the procedure. Written consent was obtained. Ultrasound was performed to localize and mark an adequate pocket of fluid in the left chest. The area was then prepped and draped in the normal sterile fashion. 1% Lidocaine was used for local anesthesia. Under ultrasound guidance a Safe-T-Centesis catheter was introduced. Thoracentesis was performed. The catheter was removed and a dressing applied. FINDINGS: A total of approximately 520 mL of hazy, amber colored fluid was removed. Samples were sent to the laboratory as requested by the clinical team. IMPRESSION: Successful ultrasound guided left thoracentesis yielding 520 mL of pleural fluid. Read by: Ascencion Dike PA-C Electronically Signed   By: Corrie Mckusick D.O.   On: 04/11/2017 13:54    Cardiac Studies   2D Echo 04/11/17 Study Conclusions  - Left ventricle: The cavity size was normal. Wall thickness was normal. Systolic function was normal. The estimated ejection fraction was in the range of 50% to 55%. - Aortic valve: There was trivial regurgitation. - Mitral valve: S/P MV repair with 28 mm annuloplasty ring. MV leaflets are thickened. Peak and mean gradients through the valve are 16 and 9 mm Hg respectively. This is mildly increased from gradients seen at echo at [facility] in Jan 2019 Valve area by continuity equation (using LVOT flow): 0.95 cm^2. - Left atrium: The atrium was moderately dilated. - Right ventricle: The cavity size was mildly dilated. Systolic function was mildly to moderately reduced. - Tricuspid  valve: s/p TV repair with annuloplasty ring . TV leaflelts are normal. - Pericardium, extracardiac: A small pericardial effusion was identified.  Impressions:  - Compared to echo report from [facility] (Jan 2019) there does not appear to be signficant change.   Patient Profile     77 y.o.femalewith a hx of PE on Eliquis, persistent a fib with DCCV 03/28/17, Maze 02/22/17, S/p MV and TV ring repair 02/22/17, post op TIA with focal seizure and HITwho is being seen today for the evaluation of SOBand acute on chronic diastolic CHFat the request of Dr. Evangeline Gula.  Assessment & Plan    1. Acute on Chronic Diastolic CHF: EF normal by echo, 50-55%. She had good diuretic response to IV Lasix  and was transitioned back to PO torsemide.  I/Os net negative 7.78L since admit. Weight is down from 162 lb on admit to 154 lb today. She still feels a bit short of breath with activity. She still has faint left sided crackles on exam but sounds to be improved in comparison to yesterday. SCr remains stable at 1.04 today. She is hypokalemic at 3.3  and needs supplementation.  Continue PO Torsemide 40 mg BID along with supplemental K. We can get repeat BMP in our office at time of post hospital f/u. Pt also a little tachycardic. May consider adding low dose BB, which will also help to increase diastolic filling time.   2. History of Segmental PE: on Eliquis 5 mg BID.   3. Persistent Atrial Fibrillation: Maintaining NSR on tele. Resting rate slightly up today in the low 100s. May increase dose of PO Cardizem or add low dose BB for better rate control. Continue Eliquis for a/c.   4. S/p MV and TV Repair: performed at [facility] 02/22/17. Echo 04/11/17 showed MV leaflets are thickened. Peak and mean gradients through the valve are 16 and 9 mm Hg respectively. This is mildly increased from gradients seen at echo at [facility] in Jan 2019 Valve area by continuity equation (using LVOT flow): 0.95 cm^2. TV functioning properly.  Compared to echo report from [facility] (Jan 2019) there does not appear to be signficant change.  5. Hypokalemia: 3.3 today, in the setting of IV diuretic therapy. Give supplemental K for correction. 40 mEq of Kdur ordered for today.   6. HTN: controlled on current regimen. 114/56 this morning.   7. H/o HIT: Heparin listed in allergy list.   8. Ascending Aortic Aneurysm: noted on chest CT 04/09/17. 4.1 cm. Pt will need annual imaging.   9. Pleural Effusion: s/p successful ultrasound guided left thoracentesis 04/11/17 yielding 520 mL of pleural fluid.  MD to follow with additional recs.   For questions or updates, please contact [place] Please consult [email or web address] for contact info under Cardiology/STEMI.      Signed, Lyda Jester, PA-C  04/13/2017, 8:50 AM

## 2017-04-13 NOTE — Care Management Important Message (Signed)
Important Message  Patient Details  Name: Marissa Ponce MRN: [id number] Date of Birth: 01/31/1941   Medicare Important Message Given:  Yes    Orbie Pyo 04/13/2017, 3:57 PM

## 2017-04-13 NOTE — Progress Notes (Signed)
Progress Note    Marissa Ponce  TWS:[id number] DOB: January 07, 1941  DOA: 04/07/2017 PCP: Ronita Hipps, MD    Brief Narrative:   Chief complaint: Follow-up shortness of breath.  Medical records reviewed and are as summarized below:  Marissa Ponce is an 77 y.o. female with a PMH of CHF, atrial fibrillation, pulmonary embolism on chronic blood thinners status post recent mitral valve/tricuspid valve surgery and MAZE procedure on 02/22/17 who was admitted 04/07/17 for evaluation of progressive shortness of breath. On initial presentation, the patient was hypoxic with a room air saturation of 86%.  Assessment/Plan:   Principal Problem:   Acute respiratory failure secondary to acute on chronic diastolic CHF status post mitral valve and tricuspid valve repair on 02/22/17 Chest x-ray on admission showed probable CHF. BNP 210.3. Troponins negative 3. Repeat chest x-ray 04/09/17 showed moderate left-sided pleural effusion. CT subsequently done which showed bilateral lower lobe consolidations with atelectasis, pneumonia, or aspiration in the differential. 2-D echo done 04/11/17, showed an EF of 50-55%, no significant change from prior.  Underwent thoracentesis 04/11/17 with 520 cc of hazy fluid removed.  Cell count showed 803 WBCs.  LDH 112. Diuresing well on Lasix and Zaroxolyn.   I/O balance - 670 cc over the past 24 hours. Weight down 5 lbs. Creatinine stable with reduction in Lasix yesterday. No change in shortness of breath today. Still gets very dyspneic with any activity, and is visibly dyspneic when speaking. Repeat CXR personally reviewed and shows a small left pleural effusion.   Active Problems:   Hypokalemia Already on supplementation.  Repeat an additional 40 mEq of potassium now.    Acute kidney injury Resolved.  Creatinine bump noted, likely due to aggressive diuresis, no change over the past 24 hours.  Continue reduced dose Lasix.    Hyperglycemia/prediabetes Hemoglobin A1c is 6%,  corresponding to a mean plasma glucose of 126. Counseled about findings.    Ascending aortic aneurysm Found incidentally on CT scan screening. 4.1 cm. Annual imaging recommended.  Discussed findings and need for annual follow-up with the patient and her family.    History of pulmonary embolism On chronic blood thinners. No evidence of recurrence.    A-fib [facility]) Admission EKG showed normal sinus rhythm. Status post MAZE. Continue Eliquis and Cardizem.    History of stroke Continue risk factor modification.    Seizure ([place])  Continue Keppra.    Left Pleural effusion S/P 520 cc thoracentesis. F/U cultures. Continue diuresis as tolerated.    Suspected HCAP (healthcare-associated pneumonia) Doubt there was any evidence of clinically significant pneumonia though she was treated with antibiotics which were discontinued 04/12/17.  Follow-up final pleural fluid cultures.     Essential hypertension Continue Cardizem, Lasix and Zaroxolyn. Blood pressure well controlled.   Family Communication/Anticipated D/C date and plan/Code Status   DVT prophylaxis: Eliquis ordered. Code Status: Full Code.  Family Communication: Husband updated at the bedside. Disposition Plan: Home when symptoms improved, likely another 24 hours.   Medical Consultants:    Cardiology   Anti-Infectives:    Augmentin 04/11/17--->04/12/17  Doxycycline 04/11/17--->04/12/17  Vancomycin 04/08/17---> 04/11/17  Cefepime 04/07/17---> 04/11/17  Rocephin 04/09/17---> 04/10/17   Subjective:   The patient says her dyspnea is unchanged from yesterday, but overall improved from when she was admitted. No cough or fevers.  Objective:    Vitals:   04/12/17 2019 04/13/17 0609 04/13/17 0751 04/13/17 1219  BP: 114/63 (!) 100/54 (!) 114/56 (!) 110/58  Pulse: (!) 106 Marland Kitchen)  101 (!) 102 80  Resp: 18 18  20   Temp: 98.3 F (36.8 C) 97.6 F (36.4 C)  98.4 F (36.9 C)  TempSrc: Oral Oral  Oral  SpO2: 94% 92% 92% 93%  Weight:   70 kg (154 lb 4.8 oz)    Height:        Intake/Output Summary (Last 24 hours) at 04/13/2017 1304 Last data filed at 04/13/2017 0943 Gross per 24 hour  Intake 600 ml  Output 1150 ml  Net -550 ml   Filed Weights   04/11/17 0435 04/12/17 0520 04/13/17 0609  Weight: 72.5 kg (159 lb 12.8 oz) 70.6 kg (155 lb 9.6 oz) 70 kg (154 lb 4.8 oz)    Exam: General: No acute distress. Anxious. Cardiovascular: Heart sounds show a regular rate, and rhythm. No gallops or rubs. No murmurs. No JVD. Lungs: Decreased breath sounds on the right. No rales, rhonchi or wheezes. Abdomen: Soft, nontender, nondistended with normal active bowel sounds. No masses. No hepatosplenomegaly. Neurological: Alert and oriented 3. Moves all extremities 4 with equal strength. Cranial nerves II through XII grossly intact. Skin: Warm and dry. No rashes or lesions. Extremities: No clubbing or cyanosis. No edema. Pedal pulses 2+. Psychiatric: Mood and affect are anxious. Insight and judgment are fair.   Data Reviewed:   I have personally reviewed following labs and imaging studies:  Labs: Labs show the following:   Basic Metabolic Panel: Recent Labs  Lab 04/09/17 0532 04/10/17 0333 04/11/17 0613 04/12/17 0457 04/13/17 0444  NA 136 136 140 140 139  K 3.5 4.0 3.3* 3.1* 3.3*  CL 92* 94* 94* 89* 89*  CO2 31 30 33* 36* 36*  GLUCOSE 129* 127* 137* 100* 134*  BUN 12 12 14  21* 23*  CREATININE 0.88 0.70 0.91 1.04* 1.04*  CALCIUM 9.0 9.0 9.3 9.5 9.5   GFR Estimated Creatinine Clearance: 44.4 mL/min (A) (by C-G formula based on SCr of 1.04 mg/dL (H)). Liver Function Tests: Recent Labs  Lab 04/10/17 0333 04/11/17 0613  AST 31 20  ALT 13* 12*  ALKPHOS 113 97  BILITOT 0.2* 0.5  PROT 6.9 6.3*  ALBUMIN 2.7* 2.8*   Coagulation profile Recent Labs  Lab 04/07/17 2327  INR 2.38    CBC: Recent Labs  Lab 04/07/17 1644 04/10/17 0333 04/11/17 0613  WBC 16.7* 12.9* 13.3*  HGB 12.1 11.7* 11.4*  HCT 38.2 36.9  36.9  MCV 88.2 88.9 90.0  PLT 366 325 337   Cardiac Enzymes: Recent Labs  Lab 04/08/17 0110 04/08/17 0658 04/08/17 1245  TROPONINI <0.03 <0.03 <0.03   Sepsis Labs: Recent Labs  Lab 04/07/17 1644 04/07/17 2343 04/10/17 0333 04/11/17 0613  WBC 16.7*  --  12.9* 13.3*  LATICACIDVEN  --  0.95  --   --     Microbiology Recent Results (from the past 240 hour(s))  Culture, blood (Routine X 2) w Reflex to ID Panel     Status: None   Collection Time: 04/08/17  3:45 AM  Result Value Ref Range Status   Specimen Description BLOOD LEFT HAND  Final   Special Requests IN PEDIATRIC BOTTLE Blood Culture adequate volume  Final   Culture   Final    NO GROWTH 5 DAYS Performed at [facility], [place] [address], [place], [place] [address]    Report Status 04/13/2017 FINAL  Final  Culture, blood (Routine X 2) w Reflex to ID Panel     Status: None   Collection Time: 04/08/17  3:50  AM  Result Value Ref Range Status   Specimen Description BLOOD RIGHT HAND  Final   Special Requests IN PEDIATRIC BOTTLE Blood Culture adequate volume  Final   Culture   Final    NO GROWTH 5 DAYS Performed at [facility], [address], [place], [place] [address]    Report Status 04/13/2017 FINAL  Final  Culture, body fluid-bottle     Status: None (Preliminary result)   Collection Time: 04/11/17  1:44 PM  Result Value Ref Range Status   Specimen Description FLUID PLEURAL LEFT  Final   Special Requests NONE  Final   Culture   Final    NO GROWTH 2 DAYS Performed at [facility], [place] [address] [place], [place] [address]    Report Status PENDING  Incomplete  Gram stain     Status: None   Collection Time: 04/11/17  1:44 PM  Result Value Ref Range Status   Specimen Description FLUID PLEURAL LEFT  Final   Special Requests NONE  Final   Gram Stain   Final    MODERATE WBC PRESENT,BOTH PMN AND MONONUCLEAR NO ORGANISMS SEEN Performed at [facility], [address], [place], [place]  [address]    Report Status 04/11/2017 FINAL  Final    Procedures and diagnostic studies:  Dg Chest 1 View  Result Date: 04/11/2017 CLINICAL DATA:  Status post thoracentesis. EXAM: CHEST 1 VIEW COMPARISON:  Radiographs of same day. FINDINGS: Stable cardiomegaly. Status post cardiac valve repair. No pneumothorax is noted. Stable bibasilar opacities are noted concerning for edema, atelectasis or possible infiltrate. Probable mild bilateral pleural effusions are noted. Bony thorax is unremarkable. IMPRESSION: Stable bibasilar opacities are noted, left greater than right, consistent with atelectasis, infiltrate or edema. Mild bilateral pleural effusions are probably present. No pneumothorax is noted. Electronically Signed   By: Marijo Conception, M.D.   On: 04/11/2017 13:52   Dg Chest Port 1 View  Result Date: 04/13/2017 CLINICAL DATA:  CHF EXAM: PORTABLE CHEST 1 VIEW COMPARISON:  Two days ago FINDINGS: Cardiopericardial enlargement. Postoperative mitral and tricuspid valves. Low volume chest with asymmetric right diaphragm elevation. There is streaky bilateral lung opacity primarily atelectatic on recent chest CT. Small left effusion. IMPRESSION: 1. Stable compared to 2 days prior. 2. Bilateral atelectatic type opacity with small left effusion. Electronically Signed   By: Monte Fantasia M.D.   On: 04/13/2017 11:04   Ir Thoracentesis Asp Pleural Space W/img Guide  Result Date: 04/11/2017 INDICATION: Shortness of breath. Left-sided pleural effusion. Request diagnostic and therapeutic thoracentesis. EXAM: ULTRASOUND GUIDED LEFT THORACENTESIS MEDICATIONS: None. COMPLICATIONS: None immediate. Postprocedural chest x-ray negative for pneumothorax. PROCEDURE: An ultrasound guided thoracentesis was thoroughly discussed with the patient and questions answered. The benefits, risks, alternatives and complications were also discussed. The patient understands and wishes to proceed with the procedure. Written consent was  obtained. Ultrasound was performed to localize and mark an adequate pocket of fluid in the left chest. The area was then prepped and draped in the normal sterile fashion. 1% Lidocaine was used for local anesthesia. Under ultrasound guidance a Safe-T-Centesis catheter was introduced. Thoracentesis was performed. The catheter was removed and a dressing applied. FINDINGS: A total of approximately 520 mL of hazy, amber colored fluid was removed. Samples were sent to the laboratory as requested by the clinical team. IMPRESSION: Successful ultrasound guided left thoracentesis yielding 520 mL of pleural fluid. Read by: Ascencion Dike PA-C Electronically Signed   By: York Cerise  Earleen Newport D.O.   On: 04/11/2017 13:54    Medications:   . apixaban  5 mg Oral BID  . diltiazem  120 mg Oral Daily  . feeding supplement  1 Container Oral TID BM  . feeding supplement (PRO-STAT SUGAR FREE 64)  30 mL Oral BID  . levETIRAcetam  500 mg Oral Daily  . mouth rinse  15 mL Mouth Rinse BID  . potassium chloride  20 mEq Oral BID  . torsemide  40 mg Oral BID   Continuous Infusions:    LOS: 5 days   Jacquelynn Cree  [facility] Pager [phone]. If unable to reach me by pager, please call my cell phone at [phone].  *Please refer to [email or web address], password [email or web address] to get updated schedule on who will round on this patient, as hospitalists switch teams weekly. If 7PM-7AM, please contact night-coverage at [email or web address], password [email or web address] for any overnight needs.  04/13/2017, 1:04 PM

## 2017-04-14 LAB — BASIC METABOLIC PANEL
ANION GAP: 15 (ref 5–15)
BUN: 20 mg/dL (ref 6–20)
CHLORIDE: 89 mmol/L — AB (ref 101–111)
CO2: 33 mmol/L — ABNORMAL HIGH (ref 22–32)
Calcium: 9.8 mg/dL (ref 8.9–10.3)
Creatinine, Ser: 1.06 mg/dL — ABNORMAL HIGH (ref 0.44–1.00)
GFR calc Af Amer: 58 mL/min — ABNORMAL LOW (ref >60)
GFR, EST NON AFRICAN AMERICAN: 50 mL/min — AB (ref >60)
Glucose, Bld: 141 mg/dL — ABNORMAL HIGH (ref 65–99)
Potassium: 3.4 mmol/L — ABNORMAL LOW (ref 3.5–5.1)
SODIUM: 137 mmol/L (ref 135–145)

## 2017-04-14 MED ORDER — PROMETHAZINE HCL 12.5 MG PO TABS: 6.2500 mg | ORAL_TABLET | Freq: Four times a day (QID) | ORAL | 0 refills | Status: DC | PRN

## 2017-04-14 MED ORDER — POTASSIUM CHLORIDE CRYS ER 20 MEQ PO TBCR: 20.0000 meq | EXTENDED_RELEASE_TABLET | Freq: Two times a day (BID) | ORAL | 0 refills | Status: DC

## 2017-04-14 NOTE — Progress Notes (Signed)
Pt discharge instructions reviewed with pt and husband. Pt and husband verbalize understanding. Pt belongings with pt. Pt is not in distress. Pt discharged via wheelchair. Pt's husband is driving her home.

## 2017-04-14 NOTE — Progress Notes (Signed)
Patient to have Outpatient Cardiac Rehab in [place]; message left with [facility]; awaiting call back; Aneta Mins [phone]

## 2017-04-15 NOTE — Discharge Summary (Signed)
Physician Discharge Summary  Marissa Ponce ZOX:[id number] DOB: [birth year] DOA: 04/07/2017  PCP: Ronita Hipps, MD  Admit date: 04/07/2017 Discharge date: 04/15/2017  Admitted From: Home  Disposition:  Home   Recommendations for Outpatient Follow-up:  1. Follow up with Dr. Helene Kelp in 1 week 2. Please obtain BMP in one week for eval potassium, renal function 3. Please follow up with annual imaging of aortic aneurysm    Home Health: No  Equipment/Devices: None  Discharge Condition: Good  CODE STATUS: FULL Diet recommendation: Cardiac  Brief/Interim Summary: Marissa Ponce is a 77 yo F with CHF EF 55%, Afib on anticoagulation, history of PE, recent MV and TV repair with MAZE procedure at [facility] in January who presents with progressive SOB found to be hypoxic, with CXR showing edema/effusions.    Acute on chronic diastolic CHF S/p mitral and tricuspid valve repair Limited echo showed no significant change from previous.  Underwent thoracentesis on 2/25.  Diuresed 7.4L, appeared euvolemic and converted day before discharge to oral torsemide and was net even with good renal function.  Chronic dyspnea She remained dyspneic with exertion mildly the day of discharge despite what appeared to be adequate diuresis.  It was thought to be from deconditioning and her mitral valve disease, however, if this were to persist, would be appropriate to repeat CXR at hospital follow to eval for effusion and also to consider Pulm referral as an outpatient.  Possible pneumonia Questionable finding on CXR, without fever, cough.  Treated with 5 days typical coverage and doxycycline.  Nausea This was also persistent problem.  Presumed to be from deconditioning.  Phenergan was prescribed at discharge.  Hypokalemia Supplemented  Pre-Diabetes Noted to have HgbA1c 6%.  Ascending aortic aneurysm Incidental finding on CT of 4.1cm aorta.  Annual imaging recommended.  History of A fib Eliquis and Cardizem were  continued    Seizures From context of prior stroke.  No more seizures since.      Discharge Diagnoses:  Principal Problem:   Acute on chronic diastolic heart failure (HCC) Active Problems:   History of pulmonary embolism   A-fib (HCC)   Status post mitral valve repair   History of stroke   Seizure (HCC)   Status post tricuspid valve repair   Pleural effusion   SOB (shortness of breath)   HCAP (healthcare-associated pneumonia)   Essential hypertension   Ascending aortic aneurysm (HCC)   AKI (acute kidney injury) (HCC)   Prediabetes   Hypokalemia    Discharge Instructions  Discharge Instructions    Diet - low sodium heart healthy   Complete by:  As directed    Discharge instructions   Complete by:  As directed    From Dr. Loleta Books: You were admitted for trouble breathing, pulmonary edema, and hypoxia/low oxygen as a result of what appeared to have been a flare of congestive heart failure.   This was treated with higher doses of IV Lasix, and thoracentesis, to take fluid off the area *outside* the lungs with a needle.  Now that you are on oxygen, our assessment is that you are closer to "euvolemic", meaning balanced at that place between not too fluid overloaded and not too dehydrated.    Going forward: Resume your Eliquis 5 mg twice daily Resume Torsemide 40 mg twice daily Resume your potassium supplement, at 20 mg twice daily  See Dr. Helene Kelp this week, have your labs checked Follow up with Dr. Haroldine Laws as instructed by Dr. Rockne Menghini  Continue the diltiazem. Discuss  stopping Keppra with your neurologist.  I have provided a Phenergan prescription.  Discuss with Dr. Helene Kelp if this gets worse.   Increase activity slowly   Complete by:  As directed      Allergies as of 04/14/2017      Reactions   Heparin Other (See Comments)   HIT   Lovenox [enoxaparin Sodium] Other (See Comments)   HIT   Metoprolol Shortness Of Breath   Percocet [oxycodone-acetaminophen] Nausea And  Vomiting   Prednisone Other (See Comments)   wheeze   Sulfonamide Derivatives Rash      Medication List    STOP taking these medications   warfarin 5 MG tablet Commonly known as:  COUMADIN     TAKE these medications   diltiazem 120 MG 24 hr capsule Commonly known as:  DILACOR XR Take 120 mg by mouth daily.   ELIQUIS 5 MG Tabs tablet Generic drug:  apixaban Take 5 mg by mouth 2 (two) times daily.   HYDROcodone-acetaminophen 5-325 MG tablet Commonly known as:  NORCO Take 1-2 tablets by mouth every 6 (six) hours as needed for moderate pain.   levETIRAcetam 500 MG tablet Commonly known as:  KEPPRA Take 500 mg by mouth daily.   methocarbamol 500 MG tablet Commonly known as:  ROBAXIN Take 1 tablet (500 mg total) by mouth 3 (three) times daily as needed.   potassium chloride SA 20 MEQ tablet Commonly known as:  K-DUR,KLOR-CON Take 1 tablet (20 mEq total) by mouth 2 (two) times daily. What changed:    medication strength  how much to take   promethazine 12.5 MG tablet Commonly known as:  PHENERGAN Take 0.5 tablets (6.25 mg total) by mouth every 6 (six) hours as needed for nausea or vomiting.   torsemide 20 MG tablet Commonly known as:  DEMADEX Take 40 mg by mouth 2 (two) times daily.      Follow-up Information    Ronita Hipps, MD. Go on 04/25/2017.   Specialty:  Family Medicine Why:  @ 10:20 am Contact information: [place], [address] [phone]        Bensimhon, Shaune Pascal, MD. Schedule an appointment as soon as possible for a visit in 1 week(s).   Specialty:  Cardiology Why:  Heart failure follow up. Office will call with an appointment. Contact information: [address] [place] [address] [phone]        Outpatient Cardiac Rehab Follow up.   Why:  They will call you for a start up date and time for rehab Contact information: [facility] [place] [facility] [place] [phone]           Allergies  Allergen Reactions  . Heparin Other (See Comments)    HIT  . Lovenox [Enoxaparin Sodium] Other (See Comments)    HIT  . Metoprolol Shortness Of Breath  . Percocet [Oxycodone-Acetaminophen] Nausea And Vomiting  . Prednisone Other (See Comments)    wheeze  . Sulfonamide Derivatives Rash    Consultations:  Cardiology   Procedures/Studies: Dg Chest 1 View  Result Date: 04/11/2017 CLINICAL DATA:  Status post thoracentesis. EXAM: CHEST 1 VIEW COMPARISON:  Radiographs of same day. FINDINGS: Stable cardiomegaly. Status post cardiac valve repair. No pneumothorax is noted. Stable bibasilar opacities are noted concerning for edema, atelectasis or possible infiltrate. Probable mild bilateral pleural effusions are noted. Bony thorax is unremarkable. IMPRESSION: Stable bibasilar opacities are noted, left greater than right, consistent with atelectasis, infiltrate or edema. Mild  bilateral pleural effusions are probably present. No pneumothorax is noted. Electronically Signed   By: Marijo Conception, M.D.   On: 04/11/2017 13:52   Dg Chest 2 View  Result Date: 04/11/2017 CLINICAL DATA:  CHF, pneumonia, increasing shortness of breath. History of valvular heart disease, atrial fibrillation, ascending aortic aneurysm, bilateral pleural effusions, and former smoker. EXAM: CHEST  2 VIEW COMPARISON:  Chest x-ray and chest CT scan of April 09, 2017. FINDINGS: The lungs are hypoinflated. There is bibasilar atelectasis or pneumonia with increase conspicuity of air bronchograms in the left lower lobe. There are bilateral pleural effusions layering posteriorly. The cardiac silhouette is enlarged. The patient has undergone previous mitral and tricuspid valve repair-replacement. A known ascending aortic aneurysm is not clearly evident on this plain radiograph. There is multilevel degenerative disc disease of the thoracic spine. IMPRESSION: Stable appearance of the chest. Low-grade CHF with small bilateral  pleural effusions. Bibasilar atelectasis. Probable pneumonia in the left lower lobe. Electronically Signed   By: David  Martinique M.D.   On: 04/11/2017 07:36   X-ray Chest Pa And Lateral  Result Date: 04/09/2017 CLINICAL DATA:  Shortness of breath. EXAM: CHEST - BILATERAL DECUBITUS VIEW; CHEST - 2 VIEW COMPARISON:  Chest x-ray dated April 07, 2017. FINDINGS: Stable cardiomegaly. Prior mitral and tricuspid valve replacement. Persistent low lung volumes with slight interval increase in size of moderate left pleural effusion. Unchanged small right pleural effusion. Left greater than right basilar airspace disease. No pneumothorax. No acute osseous abnormality. IMPRESSION: 1. Minimal increase in size of moderate left pleural effusion. 2. Unchanged left greater than right basilar airspace disease. Electronically Signed   By: Titus Dubin M.D.   On: 04/09/2017 12:28   Dg Chest 2 View  Result Date: 04/07/2017 CLINICAL DATA:  Shortness of breath for 3 days EXAM: CHEST  2 VIEW COMPARISON:  08/28/2004 FINDINGS: Chronic cardiomegaly. Mitral and tricuspid valve repair. Enlarged main pulmonary artery. Low volume chest with small pleural effusions. There is generalized interstitial coarsening with fissure thickening. Lower lung opacities which could be atelectasis or infection. IMPRESSION: 1. Probable CHF. 2. Lower lung opacities could be atelectasis or infection. Electronically Signed   By: Monte Fantasia M.D.   On: 04/07/2017 17:56   Ct Chest Wo Contrast  Result Date: 04/09/2017 CLINICAL DATA:  Shortness of breath EXAM: CT CHEST WITHOUT CONTRAST TECHNIQUE: Multidetector CT imaging of the chest was performed following the standard protocol without IV contrast. COMPARISON:  Chest x-ray 04/09/2017, CT abdomen 05/12/2010 FINDINGS: Cardiovascular: Limited evaluation without intravenous contrast. Mild aneurysmal dilatation of the ascending aorta, measuring up to 4.1 cm. Mild aortic atherosclerosis. Enlarged pulmonary  trunk measuring 3.5 cm. Cardiomegaly with valve prosthesis. Coronary artery calcification. Small pericardial effusion. Mediastinum/Nodes: Midline trachea. No thyroid mass. 11 mm lymph node anterior to the carina, with fatty hilus. Esophagus within normal limits. Lungs/Pleura: Moderate left pleural effusion, significant portion of which appears sub pulmonic though this is only partially visualized. Consolidation is present within the bilateral lower lobes. No pneumothorax Upper Abdomen: No acute abnormality. Musculoskeletal: Degenerative changes. False appearance of sternal fracture due to respiratory motion IMPRESSION: 1. Moderate left pleural effusion, substantial portion of which appears to be sub pulmonic but this is incompletely visualized. 2. Bilateral lower lobe consolidations may reflect atelectasis pneumonia or aspiration 3. Cardiomegaly with small pericardial effusion 4. Ascending aortic aneurysm measuring up to 4.1 cm. Recommend annual imaging followup by CTA or MRA. This recommendation follows 2010 ACCF/AHA/AATS/ACR/ASA/SCA/SCAI/SIR/STS/SVM Guidelines for the Diagnosis and Management of Patients with  Thoracic Aortic Disease. Circulation. 2010; 121: e266-e369 5. Enlarged pulmonary trunk raises possibility of pulmonary artery hypertension Aortic Atherosclerosis (ICD10-I70.0). Electronically Signed   By: Donavan Foil M.D.   On: 04/09/2017 21:56   Dg Chest Bilateral Decubitus  Result Date: 04/09/2017 CLINICAL DATA:  Shortness of breath. EXAM: CHEST - BILATERAL DECUBITUS VIEW; CHEST - 2 VIEW COMPARISON:  Chest x-ray dated April 07, 2017. FINDINGS: Stable cardiomegaly. Prior mitral and tricuspid valve replacement. Persistent low lung volumes with slight interval increase in size of moderate left pleural effusion. Unchanged small right pleural effusion. Left greater than right basilar airspace disease. No pneumothorax. No acute osseous abnormality. IMPRESSION: 1. Minimal increase in size of moderate left  pleural effusion. 2. Unchanged left greater than right basilar airspace disease. Electronically Signed   By: Titus Dubin M.D.   On: 04/09/2017 12:28   Dg Chest Port 1 View  Result Date: 04/13/2017 CLINICAL DATA:  CHF EXAM: PORTABLE CHEST 1 VIEW COMPARISON:  Two days ago FINDINGS: Cardiopericardial enlargement. Postoperative mitral and tricuspid valves. Low volume chest with asymmetric right diaphragm elevation. There is streaky bilateral lung opacity primarily atelectatic on recent chest CT. Small left effusion. IMPRESSION: 1. Stable compared to 2 days prior. 2. Bilateral atelectatic type opacity with small left effusion. Electronically Signed   By: Monte Fantasia M.D.   On: 04/13/2017 11:04   Ir Thoracentesis Asp Pleural Space W/img Guide  Result Date: 04/11/2017 INDICATION: Shortness of breath. Left-sided pleural effusion. Request diagnostic and therapeutic thoracentesis. EXAM: ULTRASOUND GUIDED LEFT THORACENTESIS MEDICATIONS: None. COMPLICATIONS: None immediate. Postprocedural chest x-ray negative for pneumothorax. PROCEDURE: An ultrasound guided thoracentesis was thoroughly discussed with the patient and questions answered. The benefits, risks, alternatives and complications were also discussed. The patient understands and wishes to proceed with the procedure. Written consent was obtained. Ultrasound was performed to localize and mark an adequate pocket of fluid in the left chest. The area was then prepped and draped in the normal sterile fashion. 1% Lidocaine was used for local anesthesia. Under ultrasound guidance a Safe-T-Centesis catheter was introduced. Thoracentesis was performed. The catheter was removed and a dressing applied. FINDINGS: A total of approximately 520 mL of hazy, amber colored fluid was removed. Samples were sent to the laboratory as requested by the clinical team. IMPRESSION: Successful ultrasound guided left thoracentesis yielding 520 mL of pleural fluid. Read by: Ascencion Dike PA-C Electronically Signed   By: Corrie Mckusick D.O.   On: 04/11/2017 13:54   Echocardiogram  LV EF: 50% -   55%  ------------------------------------------------------------------- Indications:      Dyspnea 786.09.  ------------------------------------------------------------------- History:   PMH:   Atrial fibrillation.  Risk factors:  Mitral and tricuspid valve repair. History of pulmonary embolism. Pleural effusion. Ascending aortic aneurysm. Hypertension.  ------------------------------------------------------------------- Study Conclusions  - Left ventricle: The cavity size was normal. Wall thickness was   normal. Systolic function was normal. The estimated ejection   fraction was in the range of 50% to 55%. - Aortic valve: There was trivial regurgitation. - Mitral valve: S/P MV repair with 28 mm annuloplasty ring. MV   leaflets are thickened. Peak and mean gradients through the valve   are 16 and 9 mm Hg respectively. This is mildly increased from   gradients seen at echo at [facility] in Jan 2019 Valve area by   continuity equation (using LVOT flow): 0.95 cm^2. - Left atrium: The atrium was moderately dilated. - Right ventricle: The cavity size was mildly dilated. Systolic   function was mildly  to moderately reduced. - Tricuspid valve: s/p TV repair with annuloplasty ring . TV   leaflelts are normal. - Pericardium, extracardiac: A small pericardial effusion was   identified.  Impressions:  - Compared to echo report from [facility] (Jan 2019) there does not   appear to be signficant change.     Subjective: Still feeling nauseated, still somewhat dyspneic with exertion but ambulating well, eating a little.  No fever, no cough, no confusion, no chest pain, no palpitations, no leg swelling, nor orthopnea.  Discharge Exam: Vitals:   04/14/17 0802 04/14/17 1131  BP: (!) 107/58 110/62  Pulse: 99 96  Resp:    Temp: 98.7 F (37.1 C) 97.8 F (36.6 C)  SpO2: 98%  97%   Vitals:   04/13/17 1922 04/14/17 0452 04/14/17 0802 04/14/17 1131  BP: 116/68 111/61 (!) 107/58 110/62  Pulse: (!) 104 95 99 96  Resp: 20 20    Temp: 97.9 F (36.6 C) 97.8 F (36.6 C) 98.7 F (37.1 C) 97.8 F (36.6 C)  TempSrc: Oral Oral Oral Oral  SpO2: 95% 92% 98% 97%  Weight:  70.2 kg (154 lb 11.2 oz)    Height:        General: Pt is alert, awake, not in acute distress Cardiovascular: RRR, S1/S2 +, no rubs, no gallops Respiratory: CTA bilaterally, no wheezing, no rhonchi Abdominal: Soft, NT, ND, bowel sounds + Extremities: no edema, no cyanosis    The results of significant diagnostics from this hospitalization (including imaging, microbiology, ancillary and laboratory) are listed below for reference.     Microbiology: Recent Results (from the past 240 hour(s))  Culture, blood (Routine X 2) w Reflex to ID Panel     Status: None   Collection Time: 04/08/17  3:45 AM  Result Value Ref Range Status   Specimen Description BLOOD LEFT HAND  Final   Special Requests IN PEDIATRIC BOTTLE Blood Culture adequate volume  Final   Culture   Final    NO GROWTH 5 DAYS Performed at [facility], [address] [place], [place] [address]    Report Status 04/13/2017 FINAL  Final  Culture, blood (Routine X 2) w Reflex to ID Panel     Status: None   Collection Time: 04/08/17  3:50 AM  Result Value Ref Range Status   Specimen Description BLOOD RIGHT HAND  Final   Special Requests IN PEDIATRIC BOTTLE Blood Culture adequate volume  Final   Culture   Final    NO GROWTH 5 DAYS Performed at [facility], [place] [address] [place], [place] [address]    Report Status 04/13/2017 FINAL  Final  Culture, body fluid-bottle     Status: None (Preliminary result)   Collection Time: 04/11/17  1:44 PM  Result Value Ref Range Status   Specimen Description FLUID PLEURAL LEFT  Final   Special Requests NONE  Final   Culture   Final    NO GROWTH 3 DAYS Performed at [facility], [place] [address] [place], [place] [address]    Report Status PENDING  Incomplete  Gram stain     Status: None   Collection Time: 04/11/17  1:44 PM  Result Value Ref Range Status   Specimen Description FLUID PLEURAL LEFT  Final   Special Requests NONE  Final   Gram Stain   Final    MODERATE WBC PRESENT,BOTH PMN AND MONONUCLEAR NO ORGANISMS SEEN Performed at [facility], [address],  [place], [place] [address]    Report Status 04/11/2017 FINAL  Final     Labs: BNP (last 3 results) Recent Labs    04/07/17 1644  BNP 008.6*   Basic Metabolic Panel: Recent Labs  Lab 04/10/17 0333 04/11/17 0613 04/12/17 0457 04/13/17 0444 04/14/17 0739  NA 136 140 140 139 137  K 4.0 3.3* 3.1* 3.3* 3.4*  CL 94* 94* 89* 89* 89*  CO2 30 33* 36* 36* 33*  GLUCOSE 127* 137* 100* 134* 141*  BUN 12 14 21* 23* 20  CREATININE 0.70 0.91 1.04* 1.04* 1.06*  CALCIUM 9.0 9.3 9.5 9.5 9.8   Liver Function Tests: Recent Labs  Lab 04/10/17 0333 04/11/17 0613  AST 31 20  ALT 13* 12*  ALKPHOS 113 97  BILITOT 0.2* 0.5  PROT 6.9 6.3*  ALBUMIN 2.7* 2.8*   No results for input(s): LIPASE, AMYLASE in the last 168 hours. No results for input(s): AMMONIA in the last 168 hours. CBC: Recent Labs  Lab 04/10/17 0333 04/11/17 0613  WBC 12.9* 13.3*  HGB 11.7* 11.4*  HCT 36.9 36.9  MCV 88.9 90.0  PLT 325 337   Cardiac Enzymes: Recent Labs  Lab 04/08/17 0658 04/08/17 1245  TROPONINI <0.03 <0.03   BNP: Invalid input(s): POCBNP CBG: No results for input(s): GLUCAP in the last 168 hours. D-Dimer No results for input(s): DDIMER in the last 72 hours. Hgb A1c No results for input(s): HGBA1C in the last 72 hours. Lipid Profile No results for input(s): CHOL, HDL, LDLCALC, TRIG, CHOLHDL, LDLDIRECT in the last 72 hours. Thyroid function studies No results for input(s): TSH, T4TOTAL, T3FREE, THYROIDAB in the last 72 hours.  Invalid input(s): FREET3 Anemia work up No results for  input(s): VITAMINB12, FOLATE, FERRITIN, TIBC, IRON, RETICCTPCT in the last 72 hours. Urinalysis No results found for: COLORURINE, APPEARANCEUR, [place], [place], [place], [place], [place], [place], PROTEINUR, UROBILINOGEN, NITRITE, LEUKOCYTESUR Sepsis Labs Invalid input(s): PROCALCITONIN,  WBC,  LACTICIDVEN Microbiology Recent Results (from the past 240 hour(s))  Culture, blood (Routine X 2) w Reflex to ID Panel     Status: None   Collection Time: 04/08/17  3:45 AM  Result Value Ref Range Status   Specimen Description BLOOD LEFT HAND  Final   Special Requests IN PEDIATRIC BOTTLE Blood Culture adequate volume  Final   Culture   Final    NO GROWTH 5 DAYS Performed at [facility], [address] [place], [place] [address]    Report Status 04/13/2017 FINAL  Final  Culture, blood (Routine X 2) w Reflex to ID Panel     Status: None   Collection Time: 04/08/17  3:50 AM  Result Value Ref Range Status   Specimen Description BLOOD RIGHT HAND  Final   Special Requests IN PEDIATRIC BOTTLE Blood Culture adequate volume  Final   Culture   Final    NO GROWTH 5 DAYS Performed at [facility], [place] [address] [place], [place] [address]    Report Status 04/13/2017 FINAL  Final  Culture, body fluid-bottle     Status: None (Preliminary result)   Collection Time: 04/11/17  1:44 PM  Result Value Ref Range Status   Specimen Description FLUID PLEURAL LEFT  Final   Special Requests NONE  Final   Culture   Final    NO GROWTH 3 DAYS Performed at [facility], [place] [address] [place], [place] [address]    Report Status PENDING  Incomplete  Gram stain  Status: None   Collection Time: 04/11/17  1:44 PM  Result Value Ref Range Status   Specimen Description FLUID PLEURAL LEFT  Final   Special Requests NONE  Final   Gram Stain   Final    MODERATE WBC PRESENT,BOTH PMN AND MONONUCLEAR NO ORGANISMS SEEN Performed at [facility], [address], [place], [place] [address]     Report Status 04/11/2017 FINAL  Final     Time coordinating discharge: Over 30 minutes  SIGNED:   Edwin Dada, MD  [facility] 04/15/2017, 5:47 AM

## 2017-04-16 LAB — CULTURE, BODY FLUID W GRAM STAIN -BOTTLE: Culture: NO GROWTH

## 2017-04-16 LAB — CULTURE, BODY FLUID-BOTTLE

## 2017-04-18 DIAGNOSIS — R000 Tachycardia, unspecified: Secondary | ICD-10-CM | POA: Diagnosis not present

## 2017-04-18 DIAGNOSIS — I5023 Acute on chronic systolic (congestive) heart failure: Secondary | ICD-10-CM | POA: Diagnosis not present

## 2017-04-18 DIAGNOSIS — J189 Pneumonia, unspecified organism: Secondary | ICD-10-CM | POA: Diagnosis not present

## 2017-04-19 ENCOUNTER — Ambulatory Visit (HOSPITAL_COMMUNITY)
Admission: RE | Admit: 2017-04-19 | Discharge: 2017-04-19 | Disposition: A | Discharge status: Home / Self Care | Payer: Medicare HMO | Source: Ambulatory Visit | Attending: Internal Medicine | Admitting: Internal Medicine

## 2017-04-19 VITALS — BP 112/60 | HR 98 | Wt 158.5 lb

## 2017-04-19 DIAGNOSIS — I480 Paroxysmal atrial fibrillation: Secondary | ICD-10-CM | POA: Diagnosis not present

## 2017-04-19 DIAGNOSIS — L270 Generalized skin eruption due to drugs and medicaments taken internally: Secondary | ICD-10-CM

## 2017-04-19 DIAGNOSIS — Z9889 Other specified postprocedural states: Secondary | ICD-10-CM

## 2017-04-19 DIAGNOSIS — I4891 Unspecified atrial fibrillation: Secondary | ICD-10-CM | POA: Diagnosis not present

## 2017-04-19 DIAGNOSIS — I5032 Chronic diastolic (congestive) heart failure: Secondary | ICD-10-CM | POA: Diagnosis not present

## 2017-04-19 LAB — CBC
HCT: 38.2 % (ref 36.0–46.0)
Hemoglobin: 12 g/dL (ref 12.0–15.0)
MCH: 27.3 pg (ref 26.0–34.0)
MCHC: 31.4 g/dL (ref 30.0–36.0)
MCV: 87 fL (ref 78.0–100.0)
PLATELETS: 371 10*3/uL (ref 150–400)
RBC: 4.39 MIL/uL (ref 3.87–5.11)
RDW: 15 % (ref 11.5–15.5)
WBC: 9.4 10*3/uL (ref 4.0–10.5)

## 2017-04-19 LAB — BASIC METABOLIC PANEL
Anion gap: 11 (ref 5–15)
BUN: 18 mg/dL (ref 6–20)
CALCIUM: 9.6 mg/dL (ref 8.9–10.3)
CHLORIDE: 93 mmol/L — AB (ref 101–111)
CO2: 33 mmol/L — ABNORMAL HIGH (ref 22–32)
CREATININE: 1.12 mg/dL — AB (ref 0.44–1.00)
GFR calc Af Amer: 55 mL/min — ABNORMAL LOW (ref >60)
GFR, EST NON AFRICAN AMERICAN: 47 mL/min — AB (ref >60)
Glucose, Bld: 82 mg/dL (ref 65–99)
Potassium: 3.5 mmol/L (ref 3.5–5.1)
SODIUM: 137 mmol/L (ref 135–145)

## 2017-04-19 MED ORDER — TORSEMIDE 20 MG PO TABS: 40.0000 mg | ORAL_TABLET | Freq: Every day | ORAL | Status: DC

## 2017-04-19 MED ORDER — POTASSIUM CHLORIDE CRYS ER 20 MEQ PO TBCR: 20.0000 meq | EXTENDED_RELEASE_TABLET | Freq: Every day | ORAL | 0 refills | Status: DC

## 2017-04-19 NOTE — Progress Notes (Signed)
Advanced Heart Failure Clinic Note   Referring Physician: Dr. Rockne Menghini PCP: Ronita Hipps, MD PCP-Cardiologist: Richrd Humbles, MD   HPI: Marissa Ponce is a 77 y.o. female with a history of diastolic heart failure, afib on eliquis, PE, severe MR and TR S/P MV and TV repair with MAZE procedure at [facility] (02/2017), HIT, and postop TIA with focal seizure (02/2017).  Underwent MV and TV repair with Maze procedure on 02/22/17 at [place] with Dr. Delano Metz. Discharged on 03/04/17. Readmitted to [facility] 1/20-1/26 for atrial flutter. Started on amiodarone and converted to NSR.   Admitted to [facility] again 2/8-2/12 for afib. She was successfully cardioverted. Amiodarone was changed to diltiazem due to patient's concern with side effects of amiodarone. She had a pleural effusion at this time, but did not require thoracentesis.  She was re- admitted 4/09-8/1 for A/C diastolic heart failure exacerbation. She was diuresed with IV lasix, then transitioned to 40 mg PO torsemide BID. She required a thoracentesis with 520 mls off. She was also treated for possible PNA.   She presents today as a new patient for HF evaluation here with her husband and her daughter (who is an ICU Therapist, sports at [employer]). Overall doing well since discharge. Her main complaint is dry mouth and a rash that appeared 2 days ago on her back and legs. She has SOB with ADLs and reports orthopnea. She has little appetite. No CP, palpitations, or dizziness. No cough, PND, or edema. Weights are 152-153 at home. She is compliant with medications. She adheres to a low salt diet and limits her fluid intake. Denies new meds or new soaps, detergents.    Echo 04/11/2017:   - Left ventricle: The cavity size was normal. Wall thickness was   normal. Systolic function was normal. The estimated ejection   fraction was in the range of 50% to 55%. - Aortic valve: There was trivial regurgitation. - Mitral valve: S/P MV repair with 28 mm annuloplasty ring. MV   leaflets are  thickened. Peak and mean gradients through the valve   are 16 and 9 mm Hg respectively. This is mildly increased from   gradients seen at echo at [facility] in Jan 2019 Valve area by   continuity equation (using LVOT flow): 0.95 cm^2. - Left atrium: The atrium was moderately dilated. - Right ventricle: The cavity size was mildly dilated. Systolic   function was mildly to moderately reduced. - Tricuspid valve: s/p TV repair with annuloplasty ring . TV   leaflelts are normal. - Pericardium, extracardiac: A small pericardial effusion was   identified.  Review of Systems: [y] = yes, [ ]  = no   General: Weight gain [ ] ; Weight loss Blue.Reese ]; Anorexia [ y]; Fatigue Blue.Reese ]; Fever [ ] ; Chills [ ] ; Weakness [ ]   Cardiac: Chest pain/pressure [ ] ; Resting SOB [ ] ; Exertional SOB Blue.Reese ]; Orthopnea [ y]; Pedal Edema [ ] ; Palpitations [ ] ; Syncope [ ] ; Presyncope [ ] ; Paroxysmal nocturnal dyspnea[ ]   Pulmonary: Cough [ ] ; Wheezing[ ] ; Hemoptysis[ ] ; Sputum [ ] ; Snoring [ ]   GI: Vomiting[ ] ; Dysphagia[ ] ; Melena[ ] ; Hematochezia [ ] ; Heartburn[ ] ; Abdominal pain [ ] ; Constipation [ ] ; Diarrhea [ ] ; BRBPR [ ]   GU: Hematuria[ ] ; Dysuria [ ] ; Nocturia[ ]   Vascular: Pain in legs with walking [ ] ; Pain in feet with lying flat [ ] ; Non-healing sores [ ] ; Stroke [ ] ; TIA [ ] ; Slurred speech [ ] ;  Neuro: Headaches[ ] ; Vertigo[ ] ; Seizures[ ] ;  Paresthesias[ ] ;Blurred vision [ ] ; Diplopia [ ] ; Vision changes [ ]   Ortho/Skin: Arthritis [ y]; Joint pain Blue.Reese ]; Muscle pain [ ] ; Joint swelling [ ] ; Back Pain [ ] ; Rash Blue.Reese ]  Psych: Depression[ ] ; Anxiety[y ]  Heme: Bleeding problems [ ] ; Clotting disorders [ ] ; Anemia [ ]   Endocrine: Diabetes [ ] ; Thyroid dysfunction[ ]    Past Medical History:  Diagnosis Date  . Arthritis   . Complication of anesthesia   . Dysrhythmia    with caffine, "PVC's"  . Migraines     history of none recent  . PONV (postoperative nausea and vomiting)   . Shortness of breath dyspnea    with exertion      Current Outpatient Medications  Medication Sig Dispense Refill  . apixaban (ELIQUIS) 5 MG TABS tablet Take 5 mg by mouth 2 (two) times daily.    Marland Kitchen b complex vitamins capsule Take 1 capsule by mouth daily.    . cholecalciferol (VITAMIN D) 1000 units tablet Take 1,000 Units by mouth daily.    Marland Kitchen diltiazem (DILACOR XR) 120 MG 24 hr capsule Take 120 mg by mouth daily.    Marland Kitchen levETIRAcetam (KEPPRA) 500 MG tablet Take 500 mg by mouth daily.    . potassium chloride SA (K-DUR,KLOR-CON) 20 MEQ tablet Take 1 tablet (20 mEq total) by mouth 2 (two) times daily. 60 tablet 0  . promethazine (PHENERGAN) 12.5 MG tablet Take 0.5 tablets (6.25 mg total) by mouth every 6 (six) hours as needed for nausea or vomiting. 30 tablet 0  . torsemide (DEMADEX) 20 MG tablet Take 40 mg by mouth 2 (two) times daily.     No current facility-administered medications for this encounter.     Allergies  Allergen Reactions  . Heparin Other (See Comments)    HIT  . Lovenox [Enoxaparin Sodium] Other (See Comments)    HIT  . Metoprolol Shortness Of Breath  . Percocet [Oxycodone-Acetaminophen] Nausea And Vomiting  . Prednisone Other (See Comments)    wheeze  . Sulfonamide Derivatives Rash      Social History   Socioeconomic History  . Marital status: Married    Spouse name: Not on file  . Number of children: Not on file  . Years of education: Not on file  . Highest education level: Not on file  Social Needs  . Financial resource strain: Not on file  . Food insecurity - worry: Not on file  . Food insecurity - inability: Not on file  . Transportation needs - medical: Not on file  . Transportation needs - non-medical: Not on file  Occupational History  . Not on file  Tobacco Use  . Smoking status: Former Smoker    Years: 8.00  . Smokeless tobacco: Never Used  . Tobacco comment: quit age 70  Substance and Sexual Activity  . Alcohol use: No  . Drug use: No  . Sexual activity: Not on file  Other Topics  Concern  . Not on file  Social History Narrative  . Not on file      Family History  Problem Relation Age of Onset  . Heart failure Mother   . Valvular heart disease Mother   . Cancer Father     Vitals:   04/19/17 1019  BP: 112/60  Pulse: 98  SpO2: 94%  Weight: 158 lb 8 oz (71.9 kg)     PHYSICAL EXAM: General: Elderly. No respiratory difficulty HEENT: normal Neck: supple. no JVD. Carotids 2+ bilat; no  bruits. No lymphadenopathy or thyromegaly appreciated. Cor: PMI nondisplaced. Regular rate & rhythm. No rubs, gallops or murmurs. Lungs: Diminished RLL Abdomen: soft, nontender, nondistended. No hepatosplenomegaly. No bruits or masses. Good bowel sounds. Extremities: no cyanosis, clubbing, edema; Rash on back, abdomen and BLE Neuro: alert & oriented x 3, cranial nerves grossly intact. moves all 4 extremities w/o difficulty. Affect pleasant.  ECG: NSR 92, personally reviewed.    ASSESSMENT & PLAN:  1. Chronic Diastolic Heart Failure. Echo 04/11/2017: EF 50-55%, moderate mitral stenosis, LA moderate dilated, RV mild to moderate reduced systolic function - NYHA III. Volume status dry - Reduce torsemide to 40 mg daily and reduce potassium to 20 meq daily. Discussed taking an extra dose for weight >155 lbs and holding scheduled dose for weight < 150 lbs.  - Consider starting spiro at next visit - Check BMET today - Repeat echo in 3-4 months (June/July 2018)  2. Paroxysmal Afib - EKG today shows NSR - Continue eliquis. No s/s bleeding. Check CBC today - Continue diltiazem. She did not tolerate amiodarone.   3. History of post-op PE - Continue eliquis  4. Severe MR s/p MV and TV repair 02/2017 - Moderate mitral stenosis on Echo 2/25 - Repeat echo in 3-4 months  5. Postop TIA with focal seizure (02/2017) - No further seizure activity - Concern for allergic reaction to keppra - Refer to neurology for appointment this week  6. Rash on back, abdomen, and BLE - Appeared 2  days ago. No itching. No new detergents. Newest medications are diltiazem and keppra- both started about 2 weeks ago per family - High suspicion for keppra drug reaction. Refer to neurology for management.  Decrease torsemide to 40 mg daily Decrease potassium to 20 meq daily BMET, CBC today Refer to neurology  Georgiana Shore, NP 04/19/17   Patient seen and examined with the above-signed Advanced Practice Provider and/or Housestaff. I personally reviewed laboratory data, imaging studies and relevant notes. I independently examined the patient and formulated the important aspects of the plan. I have edited the note to reflect any of my changes or salient points. I have personally discussed the plan with the patient and/or family.  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 Now s/p recent MVR/TVR and Maze at [facility] in January. Had difficult post-op course with HIT/TIA/seizure and PE followed by several admissions for recurrent AF/AFL and volume overload. Volume status now improved and functional status improving slowly. On echo has moderate MS. Functional status improving. On exam, she is regular with no volume overload. Truncal rash c/w drug rash.   Agree with cutting back diuretics and using sliding-scale regimen. Suspect drug rash due to Keppra. (discussed with PharmD). Will arrange f/u with Neuro to make sure it is ok to stop. Maintaining NSR. Continue Eliquis and diltiazem. Refuses amio. Refer to CR. Will follow closely.   Glori Bickers, MD  2:01 PM

## 2017-04-19 NOTE — Patient Instructions (Addendum)
Decrease Torsemide to 40 mg (2 tabs) DAILY  If your weight is >155 lb take an extra tab  If your weight is <150 lb DO NOT TAKE ANY THAT DAY  Decrease Potassium to 20 meq DAILY  If you do not take your Torsemide then do not take your potassium  Labs today  You have been referred to Neurology, we are working to get you in soon  Your physician recommends that you schedule a follow-up appointment in: 2 weeks

## 2017-04-22 ENCOUNTER — Ambulatory Visit: Payer: Medicare HMO | Admitting: Neurology

## 2017-04-22 ENCOUNTER — Encounter: Payer: Self-pay | Admitting: Neurology

## 2017-04-22 VITALS — BP 110/68 | HR 98 | Ht 66.0 in | Wt 158.0 lb

## 2017-04-22 DIAGNOSIS — I63412 Cerebral infarction due to embolism of left middle cerebral artery: Secondary | ICD-10-CM | POA: Diagnosis not present

## 2017-04-22 DIAGNOSIS — R569 Unspecified convulsions: Secondary | ICD-10-CM

## 2017-04-22 DIAGNOSIS — I4891 Unspecified atrial fibrillation: Secondary | ICD-10-CM | POA: Diagnosis not present

## 2017-04-22 DIAGNOSIS — I69398 Other sequelae of cerebral infarction: Secondary | ICD-10-CM | POA: Diagnosis not present

## 2017-04-22 DIAGNOSIS — R21 Rash and other nonspecific skin eruption: Secondary | ICD-10-CM | POA: Diagnosis not present

## 2017-04-22 NOTE — Patient Instructions (Signed)
1. We will check another EEG.  If it reveals abnormalities that suggest increased risk for seizures, then we need to start an anti-seizure medication. 2. If medication has been prescribed for you to prevent seizures, take it exactly as directed.  Do not stop taking the medicine without talking to your doctor first, even if you have not had a seizure in a long time.   3. Avoid activities in which a seizure would cause danger to yourself or to others.  Don't operate dangerous machinery, swim alone, or climb in high or dangerous places, such as on ladders, roofs, or girders.  Do not drive unless your doctor says you may.  4. If you have any warning that you may have a seizure, lay down in a safe place where you can't hurt yourself.    5.  No driving for 6 months from last seizure, as per [facility].   Please refer to the following link on the [facility] website for more information: http://[email or web address]/[email or web address]/drivingu.cfm   6.  Maintain good sleep hygiene.  7.  Notify your neurology if you are planning pregnancy or if you become pregnant.  8.  Contact your doctor if you have any problems that may be related to the medicine you are taking.  9.  Call 911 and bring the patient back to the ED if:        A.  The seizure lasts longer than 5 minutes.       B.  The patient doesn't awaken shortly after the seizure  C.  The patient has new problems such as difficulty seeing, speaking or moving  D.  The patient was injured during the seizure  E.  The patient has a temperature over 102 F (39C)  F.  The patient vomited and now is having trouble breathing

## 2017-04-22 NOTE — Progress Notes (Signed)
NEUROLOGY CONSULTATION NOTE  Marissa Ponce MRN: [id number] DOB: [birth year]  Referring provider: Dr. Haroldine Laws Primary care provider: Dr. Helene Kelp  Reason for consult:  Drug rash to Keppra  HISTORY OF PRESENT ILLNESS: Marissa Ponce is a 77 year old right-handed female with CHF, atrial fibrillation, status post MVR/TVR/Maze operation who presents for medication management following seizure.  History supplemented by hospital admissions at [facility].  CT reports reviewed.  Images not available.  She was admitted to [facility] on 02/21/17 for surgical intervention for mitral and tricuspid valve repair.  On postoperative day one (1/9), she experienced sudden onset right facial droop and right upper extremity weakness with dysarthria in setting of acute hypoxemia, lasting about 10 minutes. CT of head demonstrated "subtle asymmetric hypodensity in the left frontal lobe white matter which may reflect sequela of small vessel ischemic disease or acute ischemia".  CTA of head and neck demonstrated no significant intracranial or extracranial large vessel stenosis or occlusion. Later, she exhibited a brief episode of myoclonic jerks of the head and right upper extremity Prolonged video EEG showed diffuse slowing with intermittent left hemispheric slowing.  She was started on Keppra.  However, she was subsequently changed to Vimpat after she complained of feeling "weird with weak legs".  She was also found to have an acute pulmonary embolism and apixaban was changed to Coumadin.  She was readmitted to [facility] on 03/06/17 for dyspnea and atrial flutter.  She also endorsed nausea and not feeling well.  Lacosamide level was elevated at 10.4.  Vimpat dose was reduced from 100mg  twice daily to 75mg  twice daily.  When she was readmitted to the hospital on 03/25/17 for symptomatic atrial fibrillation, she stated that Vimpat was too expensive and she was switched back to Keppra 500mg  twice daily.  She was  also started on cardizem.  Last week, she was treated with antibiotics for pneumonia.  On Monday, she developed a rash involving her legs, abdomen and back.  She stopped the Keppra due to concern that it was a drug-reaction.  The rash persists.  She says it is maybe a little improved.  She reports that it itches.  02/26/17 LABS:  LDL 54  PAST MEDICAL HISTORY: Past Medical History:  Diagnosis Date  . Arthritis   . Complication of anesthesia   . Dysrhythmia    with caffine, "PVC's"  . Migraines     history of none recent  . PONV (postoperative nausea and vomiting)   . Shortness of breath dyspnea    with exertion    PAST SURGICAL HISTORY: Past Surgical History:  Procedure Laterality Date  . ABDOMINAL HYSTERECTOMY    . ANTERIOR AND POSTERIOR REPAIR    . CARDIAC SURGERY     micuspid and tricuspid valve in 02/22/17  . CHOLECYSTECTOMY    . COLONOSCOPY    . ESOPHAGEAL DILATION    . HERNIA REPAIR Left    [place]  . IR THORACENTESIS ASP PLEURAL SPACE W/IMG GUIDE  04/11/2017  . ROTATOR CUFF REPAIR Right 2010ish  . TOTAL KNEE ARTHROPLASTY Left 05/16/2015   Procedure: LEFT TOTAL KNEE ARTHROPLASTY;  Surgeon: Netta Cedars, MD;  Location: [place];  Service: Orthopedics;  Laterality: Left;    MEDICATIONS: Current Outpatient Medications on File Prior to Visit  Medication Sig Dispense Refill  . apixaban (ELIQUIS) 5 MG TABS tablet Take 5 mg by mouth 2 (two) times daily.    Marland Kitchen b complex vitamins capsule Take 1 capsule by mouth daily.    Marland Kitchen  cholecalciferol (VITAMIN D) 1000 units tablet Take 1,000 Units by mouth daily.    Marland Kitchen diltiazem (DILACOR XR) 120 MG 24 hr capsule Take 120 mg by mouth daily.    . potassium chloride SA (K-DUR,KLOR-CON) 20 MEQ tablet Take 1 tablet (20 mEq total) by mouth daily. 60 tablet 0  . promethazine (PHENERGAN) 12.5 MG tablet Take 0.5 tablets (6.25 mg total) by mouth every 6 (six) hours as needed for nausea or vomiting. 30 tablet 0  . torsemide (DEMADEX) 20 MG tablet Take 2  tablets (40 mg total) by mouth daily. Take an extra tab for weight >155    . levETIRAcetam (KEPPRA) 500 MG tablet Take 500 mg by mouth daily.     No current facility-administered medications on file prior to visit.     ALLERGIES: Allergies  Allergen Reactions  . Heparin Other (See Comments)    HIT  . Lovenox [Enoxaparin Sodium] Other (See Comments)    HIT  . Metoprolol Shortness Of Breath  . Percocet [Oxycodone-Acetaminophen] Nausea And Vomiting  . Prednisone Other (See Comments)    wheeze  . Sulfonamide Derivatives Rash    FAMILY HISTORY: Family History  Problem Relation Age of Onset  . Heart failure Mother   . Valvular heart disease Mother   . Cancer Father     SOCIAL HISTORY: Social History   Socioeconomic History  . Marital status: Married    Spouse name: Not on file  . Number of children: Not on file  . Years of education: Not on file  . Highest education level: Not on file  Social Needs  . Financial resource strain: Not on file  . Food insecurity - worry: Not on file  . Food insecurity - inability: Not on file  . Transportation needs - medical: Not on file  . Transportation needs - non-medical: Not on file  Occupational History  . Not on file  Tobacco Use  . Smoking status: Former Smoker    Years: 8.00  . Smokeless tobacco: Never Used  . Tobacco comment: quit age 70  Substance and Sexual Activity  . Alcohol use: No  . Drug use: No  . Sexual activity: Not on file  Other Topics Concern  . Not on file  Social History Narrative  . Not on file    REVIEW OF SYSTEMS: Constitutional: No fevers, chills, or sweats, no generalized fatigue, change in appetite Eyes: No visual changes, double vision, eye pain Ear, nose and throat: No hearing loss, ear pain, nasal congestion, sore throat Cardiovascular: No chest pain, palpitations Respiratory:  Shortness of breath GastrointestinaI: No nausea, vomiting, diarrhea, abdominal pain, fecal  incontinence Genitourinary:  No dysuria, urinary retention or frequency Musculoskeletal:  No neck pain, back pain Integumentary: rash Neurological: as above Psychiatric: No depression, insomnia, anxiety Endocrine: No palpitations, fatigue, diaphoresis, mood swings, change in appetite, change in weight, increased thirst Hematologic/Lymphatic:  No purpura, petechiae. Allergic/Immunologic: no itchy/runny eyes, nasal congestion, recent allergic reactions, rashes  PHYSICAL EXAM: Vitals:   04/22/17 1450  BP: 110/68  Pulse: 98   General: No acute distress.  Patient appears well-groomed.  Head:  Normocephalic/atraumatic Eyes:  fundi examined but not visualized Neck: supple, no paraspinal tenderness, full range of motion Back: No paraspinal tenderness Heart: regular rate and rhythm Lungs: Clear to auscultation bilaterally. Vascular: No carotid bruits. Neurological Exam: Mental status: alert and oriented to person, place, and time, recent and remote memory intact, fund of knowledge intact, attention and concentration intact, speech fluent and not dysarthric, language  intact. Cranial nerves: CN I: not tested CN II: pupils equal, round and reactive to light, visual fields intact CN III, IV, VI:  full range of motion, no nystagmus, no ptosis CN V: facial sensation intact CN VII: upper and lower face symmetric CN VIII: hearing intact CN IX, X: gag intact, uvula midline CN XI: sternocleidomastoid and trapezius muscles intact CN XII: tongue midline Bulk & Tone: normal, no fasciculations. Motor:  5/5 throughout  Sensation: temperature and vibration sensation intact. Deep Tendon Reflexes:  2+ throughout, toes downgoing.  Finger to nose testing:  Without dysmetria.  Heel to shin:  Without dysmetria.  Gait:  Normal station and stride.  Able to turn and tandem walk. Romberg negative.  IMPRESSION: 1.  Left MCA territory stroke versus TIA.  Symptoms were brief.  It is unclear if head CT  demonstrated an infarct or was just chronic small vessel disease. 2.  Post-stroke focal seizure 3.  Rash.  Possible drug reaction.  It could be Keppra, especially if it resolves in a week.  However, it may be a side effect of the cardizem or possibly the recent antibiotics as well. 4.  Atrial fibrillation.  I want to repeat EEG to evaluate for epileptiform discharges.  If present, then I recommend restarting anti-epileptic medication.  She is able to have an MRI.  I would like to get an MRI to verify a stroke in the left frontal region.  If there is evidence of a stroke, then we need to consider that as a trigger for future seizures.  Unfortunately, due to her heart failure, she is unable to lay flat at this time.  If both EEG and MRI of brain are unremarkable, then I would feel more comfortable that she may remain off of antiepileptic medication.  In the interim, she wishes to remain off of antiepileptic medication even though she may be at risk for seizure.  PLAN: 1.  We will check EEG 2.  When able, I would like to get MRI of brain without contrast 3.  Informed patient of [facility], stating no driving until seizure-free for 6 months. 4.  Provided information about seizure precaution 5.  She will follow up in 3 months.  Thank you for allowing me to take part in the care of this patient.  Metta Clines, DO  CC: Kennith Maes, MD

## 2017-04-25 DIAGNOSIS — Z6826 Body mass index (BMI) 26.0-26.9, adult: Secondary | ICD-10-CM | POA: Diagnosis not present

## 2017-04-25 DIAGNOSIS — I4891 Unspecified atrial fibrillation: Secondary | ICD-10-CM | POA: Diagnosis not present

## 2017-05-02 DIAGNOSIS — H9211 Otorrhea, right ear: Secondary | ICD-10-CM | POA: Diagnosis not present

## 2017-05-03 ENCOUNTER — Ambulatory Visit (HOSPITAL_COMMUNITY)
Admission: RE | Admit: 2017-05-03 | Discharge: 2017-05-03 | Disposition: A | Discharge status: Home / Self Care | Payer: Medicare HMO | Source: Ambulatory Visit | Attending: Cardiology | Admitting: Cardiology

## 2017-05-03 VITALS — BP 112/64 | HR 96 | Wt 157.6 lb

## 2017-05-03 DIAGNOSIS — I5033 Acute on chronic diastolic (congestive) heart failure: Secondary | ICD-10-CM | POA: Diagnosis not present

## 2017-05-03 DIAGNOSIS — I5032 Chronic diastolic (congestive) heart failure: Secondary | ICD-10-CM | POA: Insufficient documentation

## 2017-05-03 DIAGNOSIS — Z888 Allergy status to other drugs, medicaments and biological substances status: Secondary | ICD-10-CM | POA: Diagnosis not present

## 2017-05-03 DIAGNOSIS — R569 Unspecified convulsions: Secondary | ICD-10-CM | POA: Diagnosis not present

## 2017-05-03 DIAGNOSIS — Z882 Allergy status to sulfonamides status: Secondary | ICD-10-CM | POA: Diagnosis not present

## 2017-05-03 DIAGNOSIS — Z885 Allergy status to narcotic agent status: Secondary | ICD-10-CM | POA: Diagnosis not present

## 2017-05-03 DIAGNOSIS — G459 Transient cerebral ischemic attack, unspecified: Secondary | ICD-10-CM | POA: Insufficient documentation

## 2017-05-03 DIAGNOSIS — Z7901 Long term (current) use of anticoagulants: Secondary | ICD-10-CM | POA: Diagnosis not present

## 2017-05-03 DIAGNOSIS — R21 Rash and other nonspecific skin eruption: Secondary | ICD-10-CM | POA: Insufficient documentation

## 2017-05-03 DIAGNOSIS — I10 Essential (primary) hypertension: Secondary | ICD-10-CM

## 2017-05-03 DIAGNOSIS — Z87891 Personal history of nicotine dependence: Secondary | ICD-10-CM | POA: Diagnosis not present

## 2017-05-03 DIAGNOSIS — Z79899 Other long term (current) drug therapy: Secondary | ICD-10-CM | POA: Insufficient documentation

## 2017-05-03 DIAGNOSIS — I5030 Unspecified diastolic (congestive) heart failure: Secondary | ICD-10-CM

## 2017-05-03 DIAGNOSIS — I480 Paroxysmal atrial fibrillation: Secondary | ICD-10-CM | POA: Diagnosis not present

## 2017-05-03 HISTORY — DX: Unspecified diastolic (congestive) heart failure: I50.30

## 2017-05-03 LAB — BASIC METABOLIC PANEL
Anion gap: 12 (ref 5–15)
BUN: 11 mg/dL (ref 6–20)
CALCIUM: 9.3 mg/dL (ref 8.9–10.3)
CO2: 31 mmol/L (ref 22–32)
CREATININE: 1.07 mg/dL — AB (ref 0.44–1.00)
Chloride: 95 mmol/L — ABNORMAL LOW (ref 101–111)
GFR calc Af Amer: 58 mL/min — ABNORMAL LOW (ref >60)
GFR, EST NON AFRICAN AMERICAN: 50 mL/min — AB (ref >60)
Glucose, Bld: 86 mg/dL (ref 65–99)
POTASSIUM: 3.6 mmol/L (ref 3.5–5.1)
SODIUM: 138 mmol/L (ref 135–145)

## 2017-05-03 LAB — MAGNESIUM: MAGNESIUM: 1.5 mg/dL — AB (ref 1.7–2.4)

## 2017-05-03 MED ORDER — SPIRONOLACTONE 25 MG PO TABS: 12.5000 mg | ORAL_TABLET | Freq: Every day | ORAL | 3 refills | Status: DC

## 2017-05-03 NOTE — Progress Notes (Signed)
Advanced Heart Failure Clinic Note   Referring Physician: Dr. Rockne Menghini PCP: Ronita Hipps, MD PCP-Cardiologist: Richrd Humbles, MD   HPI: Marissa Ponce is a 77 y.o. female with a history of diastolic heart failure, afib on eliquis, PE, severe MR and TR S/P MV and TV repair with MAZE procedure at [facility] (02/2017), HIT, and postop TIA with focal seizure (02/2017).  Underwent MV and TV repair with Maze procedure on 02/22/17 at [place] with Dr. Delano Metz. Discharged on 03/04/17. Readmitted to [facility] 1/20-1/26 for atrial flutter. Started on amiodarone and converted to NSR.   Admitted to [facility] again 2/8-2/12 for afib. She was successfully cardioverted. Amiodarone was changed to diltiazem due to patient's concern with side effects of amiodarone. She had a pleural effusion at this time, but did not require thoracentesis.  She was re-admitted 6/07-3/7 for A/C diastolic heart failure exacerbation. She was diuresed with IV lasix, then transitioned to 40 mg PO torsemide BID. She required a thoracentesis with 520 mls off. She was also treated for possible PNA.   She presents today for regular follow up. Here with hr husband, Feeling much better. Rash resolved in several days off Keppra. She decreased torsemide back to daily, but has been taking evening doses intermittently to keep her weight below 155 at home.  She denies CP, palpitations, or dizziness. No edema. She listens to her heart with her stethoscope, and states it has sounded regular. She is taking all medications as directed. Watching salt and fluid. She turned down cardiac rehab yesterday as she is "up and about" with chores and errands and feels like she is already "doing enough." No bleeding with Eliquis.   Echo 04/11/2017:   - Left ventricle: The cavity size was normal. Wall thickness was   normal. Systolic function was normal. The estimated ejection   fraction was in the range of 50% to 55%. - Aortic valve: There was trivial regurgitation. - Mitral  valve: S/P MV repair with 28 mm annuloplasty ring. MV   leaflets are thickened. Peak and mean gradients through the valve   are 16 and 9 mm Hg respectively. This is mildly increased from   gradients seen at echo at [facility] in Jan 2019 Valve area by   continuity equation (using LVOT flow): 0.95 cm^2. - Left atrium: The atrium was moderately dilated. - Right ventricle: The cavity size was mildly dilated. Systolic   function was mildly to moderately reduced. - Tricuspid valve: s/p TV repair with annuloplasty ring . TV   leaflelts are normal. - Pericardium, extracardiac: A small pericardial effusion was   identified.  Review of systems complete and found to be negative unless listed in HPI.     Past Medical History:  Diagnosis Date  . Arthritis   . Complication of anesthesia   . Dysrhythmia    with caffine, "PVC's"  . Migraines     history of none recent  . PONV (postoperative nausea and vomiting)   . Shortness of breath dyspnea    with exertion    Current Outpatient Medications  Medication Sig Dispense Refill  . apixaban (ELIQUIS) 5 MG TABS tablet Take 5 mg by mouth 2 (two) times daily.    Marland Kitchen b complex vitamins capsule Take 1 capsule by mouth daily.    . cholecalciferol (VITAMIN D) 1000 units tablet Take 1,000 Units by mouth daily.    Marland Kitchen diltiazem (DILACOR XR) 120 MG 24 hr capsule Take 120 mg by mouth daily.    Marland Kitchen levETIRAcetam (KEPPRA) 500  MG tablet Take 500 mg by mouth daily.    . potassium chloride SA (K-DUR,KLOR-CON) 20 MEQ tablet Take 1 tablet (20 mEq total) by mouth daily. 60 tablet 0  . promethazine (PHENERGAN) 12.5 MG tablet Take 0.5 tablets (6.25 mg total) by mouth every 6 (six) hours as needed for nausea or vomiting. 30 tablet 0  . torsemide (DEMADEX) 20 MG tablet Take 2 tablets (40 mg total) by mouth daily. Take an extra tab for weight >155    . spironolactone (ALDACTONE) 25 MG tablet Take 0.5 tablets (12.5 mg total) by mouth daily. 45 tablet 3   No current  facility-administered medications for this encounter.     Allergies  Allergen Reactions  . Heparin Other (See Comments)    HIT  . Lovenox [Enoxaparin Sodium] Other (See Comments)    HIT  . Metoprolol Shortness Of Breath  . Percocet [Oxycodone-Acetaminophen] Nausea And Vomiting  . Prednisone Other (See Comments)    wheeze  . Sulfonamide Derivatives Rash      Social History   Socioeconomic History  . Marital status: Married    Spouse name: Not on file  . Number of children: Not on file  . Years of education: Not on file  . Highest education level: Not on file  Social Needs  . Financial resource strain: Not on file  . Food insecurity - worry: Not on file  . Food insecurity - inability: Not on file  . Transportation needs - medical: Not on file  . Transportation needs - non-medical: Not on file  Occupational History  . Not on file  Tobacco Use  . Smoking status: Former Smoker    Years: 8.00  . Smokeless tobacco: Never Used  . Tobacco comment: quit age 7  Substance and Sexual Activity  . Alcohol use: No  . Drug use: No  . Sexual activity: Not on file  Other Topics Concern  . Not on file  Social History Narrative  . Not on file   Family History  Problem Relation Age of Onset  . Heart failure Mother   . Valvular heart disease Mother   . Cancer Father    Vitals:   05/03/17 1058  BP: 112/64  Pulse: 96  SpO2: 98%  Weight: 157 lb 9.6 oz (71.5 kg)   Wt Readings from Last 3 Encounters:  05/03/17 157 lb 9.6 oz (71.5 kg)  04/22/17 158 lb (71.7 kg)  04/19/17 158 lb 8 oz (71.9 kg)    PHYSICAL EXAM: General: Well appearing. No resp difficulty. HEENT: Normal anicteric Neck: Supple. JVP 5-6. Carotids 2+ bilat; no bruits. No thyromegaly or nodule noted. Cor: PMI nondisplaced. RRR, No M/G/R noted Lungs: CTAB, normal effort. Mildly dull at right base Abdomen: Soft, non-tender, non-distended, no HSM. No bruits or masses. +BS  Extremities: No cyanosis, clubbing, or  rash. R and LLE no edema.  Neuro: Alert & orientedx3, cranial nerves grossly intact. moves all 4 extremities w/o difficulty. Affect pleasant   ASSESSMENT & PLAN:  1. Chronic Diastolic Heart Failure. Echo 04/11/2017: EF 50-55%, moderate mitral stenosis, LA moderate dilated, RV mild to moderate reduced systolic function - NYHA II - Volume status stable.  - Continue torsemide 40 mg daily. Can take extra as needed.  - Take potassium 20 meq daily, take extra 20 with extra torsemide - Start spiro 12.5 mg daily. BMET today and 10 days.  - Repeat echo in 3-4 months (June/July 2018) - Reinforced fluid restriction to < 2 L daily, sodium restriction to  less than 2000 mg daily, and the importance of daily weights.    2. Paroxysmal Afib - Regular on exam.  - Continue eliquis.  - She had bleeding from her ear last week and has seen a ENT and been given ABX drops. No further bleeding.  - No other s/s of bleeding.  - Continue diltiazem. She did not tolerate amiodarone.   3. History of post-op PE - Continue eliquis as above.   4. Severe MR s/p MV and TV repair 02/2017 - Moderate mitral stenosis on Echo 2/25 - Repeat echo in 3-4 months (June/July)  5. Postop TIA with focal seizure (02/2017) - No further seizure activity - Saw Dr. Tomi Likens 04/22/17. Plan for repeat EEG and MRI. She is off Keppra for the time being.   6. Rash on back, abdomen, and BLE - Resolved off Keppra.   Add spiro as above. BMET today and 10 days. RTC 4-6 weeks. Plan MD visit in June/Early July with Echo.   Shirley Friar, PA-C 05/03/17   Patient seen and examined with the above-signed Advanced Practice Provider and/or Housestaff. I personally reviewed laboratory data, imaging studies and relevant notes. I independently examined the patient and formulated the important aspects of the plan. I have edited the note to reflect any of my changes or salient points. I have personally discussed the plan with the patient and/or  family.  She is improving steadily. Volume status well managed though she is requiring extra doses of torsemide. Will add spiro to see if we can minimize need for extra torsemide. Check labs today and in 1 week. Maintaining NSR. Continue Eliquis. Refer to CR.   Glori Bickers, MD  10:15 PM

## 2017-05-03 NOTE — Patient Instructions (Signed)
Routine lab work today. Will notify you of abnormal results, otherwise no news is good news!  START Spironolactone 12.5 mg (1/2 tablet) once daily.  Repeat labs in 1-2 weeks. Take Rx paper with you to PCP appointment.  Will refer you to Cardiac Rehab at [facility]. They will call you to set up initial appointment.  Follow up 6 weeks with Oda Kilts PA-C.  Follow up 3 months with echocardiogram and appointment with Dr. Haroldine Laws.  Take all medication as prescribed the day of your appointment. Bring all medications with you to your appointment.  Do the following things EVERYDAY: 1) Weigh yourself in the morning before breakfast. Write it down and keep it in a log. 2) Take your medicines as prescribed 3) Eat low salt foods-Limit salt (sodium) to 2000 mg per day.  4) Stay as active as you can everyday 5) Limit all fluids for the day to less than 2 liters

## 2017-05-04 ENCOUNTER — Other Ambulatory Visit: Payer: Medicare HMO

## 2017-05-05 ENCOUNTER — Telehealth (HOSPITAL_COMMUNITY): Payer: Self-pay | Admitting: Cardiology

## 2017-05-05 MED ORDER — MAGNESIUM 400 MG PO CAPS: 400.0000 mg | ORAL_CAPSULE | Freq: Every day | ORAL | Status: DC

## 2017-05-05 NOTE — Telephone Encounter (Signed)
Notes recorded by Kerry Dory, CMA on 05/05/2017 at 3:36 PM EDT Patient aware. [phone]  Patient reports she will pick up OTC ------  Notes recorded by Kerry Dory, CMA on 05/03/2017 at 4:54 PM EDT Left message for patient to call back.  ------  Notes recorded by Shirley Friar, PA-C on 05/03/2017 at 12:54 PM EDT Please add Mag ox 400 mg daily. (Low Mg can affect potassium absorption)   Legrand Como [address] [place], [place] 05/03/2017 12:54 PM

## 2017-05-09 ENCOUNTER — Telehealth (HOSPITAL_COMMUNITY): Payer: Self-pay

## 2017-05-09 NOTE — Telephone Encounter (Signed)
Patients insurance is active and benefits verified through [place] - $45.00 co-pay, no deductible, out of pocket amount of $5,900/$1,765.00 has been met, no co-insurance, and no pre-authorization is required. Passport/reference [id number]

## 2017-05-10 ENCOUNTER — Telehealth (HOSPITAL_COMMUNITY): Payer: Self-pay | Admitting: *Deleted

## 2017-05-10 NOTE — Telephone Encounter (Signed)
Message left for pt to please contact.  Received referral from the [facility] for pt to attend cardiac rehab at [facility]. Unsure if this would be the best location since pt lives in [place] and declined [facility]/ cardiac rehab program. Contact information left.  Cherre Huger, BSN Cardiac and Training and development officer

## 2017-05-11 ENCOUNTER — Other Ambulatory Visit (HOSPITAL_COMMUNITY): Payer: Self-pay

## 2017-05-11 ENCOUNTER — Telehealth (HOSPITAL_COMMUNITY): Payer: Self-pay

## 2017-05-11 NOTE — Telephone Encounter (Signed)
Called to speak with patient in regards to Cardiac Rehab - Patient stated she is not going to participate as she cannot afford the program. Closed referral.

## 2017-05-17 DIAGNOSIS — H6121 Impacted cerumen, right ear: Secondary | ICD-10-CM | POA: Diagnosis not present

## 2017-05-17 DIAGNOSIS — H9221 Otorrhagia, right ear: Secondary | ICD-10-CM | POA: Diagnosis not present

## 2017-05-17 DIAGNOSIS — I10 Essential (primary) hypertension: Secondary | ICD-10-CM | POA: Diagnosis not present

## 2017-05-17 DIAGNOSIS — H9190 Unspecified hearing loss, unspecified ear: Secondary | ICD-10-CM | POA: Diagnosis not present

## 2017-05-17 DIAGNOSIS — H938X9 Other specified disorders of ear, unspecified ear: Secondary | ICD-10-CM | POA: Diagnosis not present

## 2017-05-20 DIAGNOSIS — R7309 Other abnormal glucose: Secondary | ICD-10-CM | POA: Diagnosis not present

## 2017-05-25 DIAGNOSIS — H6983 Other specified disorders of Eustachian tube, bilateral: Secondary | ICD-10-CM | POA: Diagnosis not present

## 2017-05-25 DIAGNOSIS — H6121 Impacted cerumen, right ear: Secondary | ICD-10-CM | POA: Diagnosis not present

## 2017-05-27 ENCOUNTER — Other Ambulatory Visit (HOSPITAL_COMMUNITY): Payer: Self-pay | Admitting: Internal Medicine

## 2017-06-02 ENCOUNTER — Other Ambulatory Visit (HOSPITAL_COMMUNITY): Payer: Self-pay | Admitting: Internal Medicine

## 2017-06-06 DIAGNOSIS — H25813 Combined forms of age-related cataract, bilateral: Secondary | ICD-10-CM | POA: Diagnosis not present

## 2017-06-06 DIAGNOSIS — H524 Presbyopia: Secondary | ICD-10-CM | POA: Diagnosis not present

## 2017-06-06 DIAGNOSIS — Z0100 Encounter for examination of eyes and vision without abnormal findings: Secondary | ICD-10-CM | POA: Diagnosis not present

## 2017-06-14 ENCOUNTER — Telehealth (HOSPITAL_COMMUNITY): Payer: Self-pay | Admitting: Cardiology

## 2017-06-14 ENCOUNTER — Ambulatory Visit (HOSPITAL_COMMUNITY)
Admission: RE | Admit: 2017-06-14 | Discharge: 2017-06-14 | Disposition: A | Discharge status: Home / Self Care | Payer: Medicare HMO | Source: Ambulatory Visit | Attending: Cardiology | Admitting: Cardiology

## 2017-06-14 ENCOUNTER — Encounter (HOSPITAL_COMMUNITY): Payer: Self-pay

## 2017-06-14 VITALS — BP 122/68 | HR 87 | Wt 152.2 lb

## 2017-06-14 DIAGNOSIS — I10 Essential (primary) hypertension: Secondary | ICD-10-CM

## 2017-06-14 DIAGNOSIS — Z8249 Family history of ischemic heart disease and other diseases of the circulatory system: Secondary | ICD-10-CM | POA: Diagnosis not present

## 2017-06-14 DIAGNOSIS — Z9889 Other specified postprocedural states: Secondary | ICD-10-CM

## 2017-06-14 DIAGNOSIS — I050 Rheumatic mitral stenosis: Secondary | ICD-10-CM | POA: Diagnosis not present

## 2017-06-14 DIAGNOSIS — I5032 Chronic diastolic (congestive) heart failure: Secondary | ICD-10-CM | POA: Insufficient documentation

## 2017-06-14 DIAGNOSIS — Z79899 Other long term (current) drug therapy: Secondary | ICD-10-CM | POA: Diagnosis not present

## 2017-06-14 DIAGNOSIS — I480 Paroxysmal atrial fibrillation: Secondary | ICD-10-CM

## 2017-06-14 DIAGNOSIS — Z882 Allergy status to sulfonamides status: Secondary | ICD-10-CM | POA: Diagnosis not present

## 2017-06-14 DIAGNOSIS — Z8673 Personal history of transient ischemic attack (TIA), and cerebral infarction without residual deficits: Secondary | ICD-10-CM | POA: Diagnosis not present

## 2017-06-14 DIAGNOSIS — Z87891 Personal history of nicotine dependence: Secondary | ICD-10-CM | POA: Diagnosis not present

## 2017-06-14 DIAGNOSIS — Z7901 Long term (current) use of anticoagulants: Secondary | ICD-10-CM | POA: Diagnosis not present

## 2017-06-14 DIAGNOSIS — Z86711 Personal history of pulmonary embolism: Secondary | ICD-10-CM | POA: Diagnosis not present

## 2017-06-14 DIAGNOSIS — Z885 Allergy status to narcotic agent status: Secondary | ICD-10-CM | POA: Insufficient documentation

## 2017-06-14 DIAGNOSIS — R569 Unspecified convulsions: Secondary | ICD-10-CM | POA: Insufficient documentation

## 2017-06-14 DIAGNOSIS — Z888 Allergy status to other drugs, medicaments and biological substances status: Secondary | ICD-10-CM | POA: Diagnosis not present

## 2017-06-14 LAB — BASIC METABOLIC PANEL
Anion gap: 10 (ref 5–15)
BUN: 14 mg/dL (ref 6–20)
CHLORIDE: 96 mmol/L — AB (ref 101–111)
CO2: 33 mmol/L — AB (ref 22–32)
CREATININE: 1.18 mg/dL — AB (ref 0.44–1.00)
Calcium: 9.5 mg/dL (ref 8.9–10.3)
GFR calc Af Amer: 51 mL/min — ABNORMAL LOW (ref >60)
GFR calc non Af Amer: 44 mL/min — ABNORMAL LOW (ref >60)
Glucose, Bld: 83 mg/dL (ref 65–99)
POTASSIUM: 3.5 mmol/L (ref 3.5–5.1)
Sodium: 139 mmol/L (ref 135–145)

## 2017-06-14 LAB — MAGNESIUM: Magnesium: 1.7 mg/dL (ref 1.7–2.4)

## 2017-06-14 MED ORDER — SPIRONOLACTONE 25 MG PO TABS: 25.0000 mg | ORAL_TABLET | Freq: Every day | ORAL | 3 refills | Status: DC

## 2017-06-14 NOTE — Patient Instructions (Signed)
Labs today (will call for abnormal results, otherwise no news is good news)  Follow up as scheduled with Dr. Bensimhon.  

## 2017-06-14 NOTE — Progress Notes (Signed)
Advanced Heart Failure Clinic Note   Referring Physician: Dr. Rockne Menghini PCP: Ronita Hipps, MD PCP-Cardiologist: Richrd Humbles, MD   HPI: Marissa Ponce is a 77 y.o. female with a history of diastolic heart failure, afib on eliquis, PE, severe MR and TR S/P MV and TV repair with MAZE procedure at [facility] (02/2017), HIT, and postop TIA with focal seizure (02/2017).  Underwent MV and TV repair with Maze procedure on 02/22/17 at [place] with Dr. Delano Metz. Discharged on 03/04/17. Readmitted to [facility] 1/20-1/26 for atrial flutter. Started on amiodarone and converted to NSR.   Admitted to [facility] again 2/8-2/12 for afib. She was successfully cardioverted. Amiodarone was changed to diltiazem due to patient's concern with side effects of amiodarone. She had a pleural effusion at this time, but did not require thoracentesis.  She was re-admitted 1/66-0/6 for A/C diastolic heart failure exacerbation. She was diuresed with IV lasix, then transitioned to 40 mg PO torsemide BID. She required a thoracentesis with 520 mls off. She was also treated for possible PNA.   She presents today for regular follow up. At last visit spiro added. Overall, she is feeling better. She has been struggling with her allergies, feels like her head is "in a barrel". Denies fevers, chills, or productive cough.  No exertional chest paint. Also denies lightheadedness, dizziness, or palpitations. Taking all medication as directed.  No DOE with ADLs or chores. Only mild SOB up a hill.   Echo 04/11/2017:   - Left ventricle: The cavity size was normal. Wall thickness was   normal. Systolic function was normal. The estimated ejection   fraction was in the range of 50% to 55%. - Aortic valve: There was trivial regurgitation. - Mitral valve: S/P MV repair with 28 mm annuloplasty ring. MV   leaflets are thickened. Peak and mean gradients through the valve   are 16 and 9 mm Hg respectively. This is mildly increased from   gradients seen at echo  at [facility] in Jan 2019 Valve area by   continuity equation (using LVOT flow): 0.95 cm^2. - Left atrium: The atrium was moderately dilated. - Right ventricle: The cavity size was mildly dilated. Systolic   function was mildly to moderately reduced. - Tricuspid valve: s/p TV repair with annuloplasty ring . TV   leaflelts are normal. - Pericardium, extracardiac: A small pericardial effusion was   identified.  Review of systems complete and found to be negative unless listed in HPI.    Past Medical History:  Diagnosis Date  . Arthritis   . Complication of anesthesia   . Dysrhythmia    with caffine, "PVC's"  . Migraines     history of none recent  . PONV (postoperative nausea and vomiting)   . Shortness of breath dyspnea    with exertion    Current Outpatient Medications  Medication Sig Dispense Refill  . apixaban (ELIQUIS) 5 MG TABS tablet Take 5 mg by mouth 2 (two) times daily.    Marland Kitchen b complex vitamins capsule Take 1 capsule by mouth daily.    Marland Kitchen CARTIA XT 120 MG 24 hr capsule TAKE 1 CAPSULE BY MOUTH ONCE DAILY. 60 capsule 3  . cholecalciferol (VITAMIN D) 1000 units tablet Take 1,000 Units by mouth daily.    Marland Kitchen diltiazem (DILACOR XR) 120 MG 24 hr capsule Take 120 mg by mouth daily.    Marland Kitchen levETIRAcetam (KEPPRA) 500 MG tablet Take 500 mg by mouth daily.    . Magnesium 400 MG CAPS Take 400 mg  by mouth daily.    . potassium chloride SA (K-DUR,KLOR-CON) 20 MEQ tablet Take 1 tablet (20 mEq total) by mouth daily. 60 tablet 0  . promethazine (PHENERGAN) 12.5 MG tablet Take 0.5 tablets (6.25 mg total) by mouth every 6 (six) hours as needed for nausea or vomiting. 30 tablet 0  . spironolactone (ALDACTONE) 25 MG tablet Take 0.5 tablets (12.5 mg total) by mouth daily. 45 tablet 3  . torsemide (DEMADEX) 20 MG tablet Take 2 tablets (40 mg total) by mouth daily. Take an extra tab for weight >155     No current facility-administered medications for this encounter.     Allergies  Allergen Reactions    . Heparin Other (See Comments)    HIT  . Lovenox [Enoxaparin Sodium] Other (See Comments)    HIT  . Metoprolol Shortness Of Breath  . Percocet [Oxycodone-Acetaminophen] Nausea And Vomiting  . Prednisone Other (See Comments)    wheeze  . Sulfonamide Derivatives Rash      Social History   Socioeconomic History  . Marital status: Married    Spouse name: Not on file  . Number of children: Not on file  . Years of education: Not on file  . Highest education level: Not on file  Occupational History  . Not on file  Social Needs  . Financial resource strain: Not on file  . Food insecurity:    Worry: Not on file    Inability: Not on file  . Transportation needs:    Medical: Not on file    Non-medical: Not on file  Tobacco Use  . Smoking status: Former Smoker    Years: 8.00  . Smokeless tobacco: Never Used  . Tobacco comment: quit age 45  Substance and Sexual Activity  . Alcohol use: No  . Drug use: No  . Sexual activity: Not on file  Lifestyle  . Physical activity:    Days per week: Not on file    Minutes per session: Not on file  . Stress: Not on file  Relationships  . Social connections:    Talks on phone: Not on file    Gets together: Not on file    Attends religious service: Not on file    Active member of club or organization: Not on file    Attends meetings of clubs or organizations: Not on file    Relationship status: Not on file  . Intimate partner violence:    Fear of current or ex partner: Not on file    Emotionally abused: Not on file    Physically abused: Not on file    Forced sexual activity: Not on file  Other Topics Concern  . Not on file  Social History Narrative  . Not on file   Family History  Problem Relation Age of Onset  . Heart failure Mother   . Valvular heart disease Mother   . Cancer Father    Vitals:   06/14/17 1031  BP: 122/68  Pulse: 87  SpO2: 98%  Weight: 152 lb 3.2 oz (69 kg)   Wt Readings from Last 3 Encounters:   06/14/17 152 lb 3.2 oz (69 kg)  05/03/17 157 lb 9.6 oz (71.5 kg)  04/22/17 158 lb (71.7 kg)    PHYSICAL EXAM: General: Well appearing. No resp difficulty. HEENT: Normal Neck: Supple. JVP 5-6. Carotids 2+ bilat; no bruits. No thyromegaly or nodule noted. Cor: PMI nondisplaced. RRR, No M/G/R noted Lungs: CTAB, normal effort. Abdomen: Soft, non-tender, non-distended, no HSM.  No bruits or masses. +BS  Extremities: No cyanosis, clubbing, or rash. R and LLE no edema.  Neuro: Alert & orientedx3, cranial nerves grossly intact. moves all 4 extremities w/o difficulty. Affect pleasant   ASSESSMENT & PLAN:  1. Chronic Diastolic Heart Failure. Echo 04/11/2017: EF 50-55%, moderate mitral stenosis, LA moderate dilated, RV mild to moderate reduced systolic function - NYHA II symptoms - Volume status stable on exam. - Continue torsemide 40 mg daily. Can take extra as needed.  - Take potassium 20 meq daily, take extra 20 with extra torsemide - Continue spiro 12.5 mg daily. BMET today and 10 days.  - Repeat echo in June.  - Reinforced fluid restriction to < 2 L daily, sodium restriction to less than 2000 mg daily, and the importance of daily weights.    2. Paroxysmal Afib - Regular on exam.  - Continue eliquis. - No s/s of bleeding.  - Continue diltiazem. She did not tolerate amiodarone.   3. History of post-op PE - Eliquis as above.   4. Severe MR s/p MV and TV repair 02/2017 - Moderate mitral stenosis on Echo 2/25 - Repeat echo at next visit (End of June)  5. Postop TIA with focal seizure (02/2017) - No further seizure activity - Saw Dr. Tomi Likens 04/22/17. Plan to repeat EEG. She is off Keppra for the time being.   6. Rash on back, abdomen, and BLE - Resolved off Keppra.   Keep 2 month MD visit with Echo. Labs today, If K stable may increase Arlyce Harman. Feeling well overall.   Shirley Friar, PA-C 06/14/17   Greater than 50% of the 25 minute visit was spent in counseling/coordination  of care regarding disease state education, salt/fluid restriction, sliding scale diuretics, and medication compliance.

## 2017-06-14 NOTE — Telephone Encounter (Signed)
Notes recorded by Kerry Dory, CMA on 06/14/2017 at 3:00 PM EDT Patient aware. Patient voiced understanding.   ------  Notes recorded by Kerry Dory, CMA on 06/14/2017 at 1:34 PM EDT Left message for patient to call back. [phone] ------  Notes recorded by Shirley Friar, PA-C on 06/14/2017 at 1:17 PM EDT K still low normal. Please increase spiro to 25 mg daily. Recheck BMET 10 days.    Legrand Como [address]" [place], PA-C 06/14/2017 1:17 PM

## 2017-06-23 ENCOUNTER — Other Ambulatory Visit (HOSPITAL_COMMUNITY): Payer: Medicare HMO

## 2017-08-02 ENCOUNTER — Other Ambulatory Visit (HOSPITAL_COMMUNITY): Payer: Self-pay | Admitting: *Deleted

## 2017-08-02 MED ORDER — TORSEMIDE 20 MG PO TABS: 40.0000 mg | ORAL_TABLET | Freq: Every day | ORAL | 3 refills | Status: DC

## 2017-08-03 ENCOUNTER — Other Ambulatory Visit (HOSPITAL_COMMUNITY): Payer: Medicare HMO

## 2017-08-03 ENCOUNTER — Encounter (HOSPITAL_COMMUNITY): Payer: Medicare HMO | Admitting: Internal Medicine

## 2017-08-08 ENCOUNTER — Ambulatory Visit (HOSPITAL_COMMUNITY)
Admission: RE | Admit: 2017-08-08 | Discharge: 2017-08-08 | Disposition: A | Discharge status: Home / Self Care | Payer: Medicare HMO | Source: Ambulatory Visit | Attending: Internal Medicine | Admitting: Internal Medicine

## 2017-08-08 ENCOUNTER — Other Ambulatory Visit (HOSPITAL_COMMUNITY): Payer: Self-pay | Admitting: *Deleted

## 2017-08-08 ENCOUNTER — Ambulatory Visit (HOSPITAL_BASED_OUTPATIENT_CLINIC_OR_DEPARTMENT_OTHER)
Admission: RE | Admit: 2017-08-08 | Discharge: 2017-08-08 | Disposition: A | Discharge status: Home / Self Care | Payer: Medicare HMO | Source: Ambulatory Visit | Attending: Internal Medicine | Admitting: Internal Medicine

## 2017-08-08 VITALS — BP 120/76 | HR 77

## 2017-08-08 DIAGNOSIS — Z885 Allergy status to narcotic agent status: Secondary | ICD-10-CM | POA: Diagnosis not present

## 2017-08-08 DIAGNOSIS — Z7901 Long term (current) use of anticoagulants: Secondary | ICD-10-CM | POA: Insufficient documentation

## 2017-08-08 DIAGNOSIS — I712 Thoracic aortic aneurysm, without rupture: Secondary | ICD-10-CM | POA: Insufficient documentation

## 2017-08-08 DIAGNOSIS — I110 Hypertensive heart disease with heart failure: Secondary | ICD-10-CM | POA: Insufficient documentation

## 2017-08-08 DIAGNOSIS — Z888 Allergy status to other drugs, medicaments and biological substances status: Secondary | ICD-10-CM | POA: Insufficient documentation

## 2017-08-08 DIAGNOSIS — G459 Transient cerebral ischemic attack, unspecified: Secondary | ICD-10-CM | POA: Diagnosis not present

## 2017-08-08 DIAGNOSIS — I050 Rheumatic mitral stenosis: Secondary | ICD-10-CM | POA: Insufficient documentation

## 2017-08-08 DIAGNOSIS — I5032 Chronic diastolic (congestive) heart failure: Secondary | ICD-10-CM | POA: Insufficient documentation

## 2017-08-08 DIAGNOSIS — Z79899 Other long term (current) drug therapy: Secondary | ICD-10-CM | POA: Insufficient documentation

## 2017-08-08 DIAGNOSIS — Z882 Allergy status to sulfonamides status: Secondary | ICD-10-CM | POA: Diagnosis not present

## 2017-08-08 DIAGNOSIS — Z9889 Other specified postprocedural states: Secondary | ICD-10-CM | POA: Diagnosis not present

## 2017-08-08 DIAGNOSIS — I480 Paroxysmal atrial fibrillation: Secondary | ICD-10-CM

## 2017-08-08 LAB — BASIC METABOLIC PANEL
ANION GAP: 10 (ref 5–15)
BUN: 23 mg/dL — ABNORMAL HIGH (ref 6–20)
CALCIUM: 9.5 mg/dL (ref 8.9–10.3)
CHLORIDE: 99 mmol/L — AB (ref 101–111)
CO2: 29 mmol/L (ref 22–32)
CREATININE: 1.14 mg/dL — AB (ref 0.44–1.00)
GFR calc non Af Amer: 46 mL/min — ABNORMAL LOW (ref >60)
GFR, EST AFRICAN AMERICAN: 53 mL/min — AB (ref >60)
Glucose, Bld: 97 mg/dL (ref 65–99)
Potassium: 3.9 mmol/L (ref 3.5–5.1)
Sodium: 138 mmol/L (ref 135–145)

## 2017-08-08 LAB — CBC
HCT: 37.3 % (ref 36.0–46.0)
Hemoglobin: 11.7 g/dL — ABNORMAL LOW (ref 12.0–15.0)
MCH: 26.1 pg (ref 26.0–34.0)
MCHC: 31.4 g/dL (ref 30.0–36.0)
MCV: 83.3 fL (ref 78.0–100.0)
PLATELETS: 259 10*3/uL (ref 150–400)
RBC: 4.48 MIL/uL (ref 3.87–5.11)
RDW: 18.5 % — ABNORMAL HIGH (ref 11.5–15.5)
WBC: 7.2 10*3/uL (ref 4.0–10.5)

## 2017-08-08 NOTE — Progress Notes (Signed)
  Echocardiogram 2D Echocardiogram has been performed.  Jennette Dubin 08/08/2017, 11:01 AM

## 2017-08-08 NOTE — Patient Instructions (Signed)
Labs today  Follow up with Dr Hedy Camara in 6 months

## 2017-08-08 NOTE — Addendum Note (Signed)
Encounter addended by: Scarlette Calico, RN on: 08/08/2017 11:37 AM  Actions taken: Order list changed, Diagnosis association updated, Sign clinical note

## 2017-08-08 NOTE — Progress Notes (Signed)
Advanced Heart Failure Clinic Note   Referring Physician: Dr. Rockne Menghini PCP: Ronita Hipps, MD PCP-Cardiologist: Richrd Humbles, MD   HPI: Marissa Ponce is a 77 y.o. female with a history of diastolic heart failure, afib on eliquis, PE, severe MR and TR S/P MV and TV repair with MAZE procedure at [facility] (02/2017), HIT, and postop TIA with focal seizure (02/2017).  Underwent MV and TV repair with Maze procedure on 02/22/17 at [place] with Dr. Delano Metz. Discharged on 03/04/17. Readmitted to [facility] 1/20-1/26 for atrial flutter. Started on amiodarone and converted to NSR.   Admitted to [place] again 2/8-2/12 for afib. She was successfully cardioverted. Amiodarone was changed to diltiazem due to patient's concern with side effects of amiodarone. She had a pleural effusion at this time, but did not require thoracentesis.  She was re-admitted 8/36-6/2 for A/C diastolic heart failure exacerbation. She was diuresed with IV lasix, then transitioned to 40 mg PO torsemide BID. She required a thoracentesis with 520 mls off. She was also treated for possible PNA.   She presents today for regular follow up. Feeling great walking 1 mile per day. No DOE, orthopnea or PND. Never went to CR. No AF. Down to torsemide 20mg  daily. Minimal edema.    Echo today: EF 55% RV mildly dilated normal function. TV/MV repairs are stable MV gradient mean 7. No MR/TR/ Ao Root mildly dilated at 4.2cm Personally reviewed    Echo 04/11/2017:   - Left ventricle: The cavity size was normal. Wall thickness was   normal. Systolic function was normal. The estimated ejection   fraction was in the range of 50% to 55%. - Aortic valve: There was trivial regurgitation. - Mitral valve: S/P MV repair with 28 mm annuloplasty ring. MV   leaflets are thickened. Peak and mean gradients through the valve   are 16 and 9 mm Hg respectively. This is mildly increased from   gradients seen at echo at [facility] in Jan 2019 Valve area by   continuity equation  (using LVOT flow): 0.95 cm^2. - Left atrium: The atrium was moderately dilated. - Right ventricle: The cavity size was mildly dilated. Systolic   function was mildly to moderately reduced. - Tricuspid valve: s/p TV repair with annuloplasty ring . TV   leaflelts are normal. - Pericardium, extracardiac: A small pericardial effusion was   identified.  Review of systems complete and found to be negative unless listed in HPI.    Past Medical History:  Diagnosis Date  . Arthritis   . Complication of anesthesia   . Dysrhythmia    with caffine, "PVC's"  . Migraines     history of none recent  . PONV (postoperative nausea and vomiting)   . Shortness of breath dyspnea    with exertion    Current Outpatient Medications  Medication Sig Dispense Refill  . apixaban (ELIQUIS) 5 MG TABS tablet Take 5 mg by mouth 2 (two) times daily.    Marland Kitchen b complex vitamins capsule Take 1 capsule by mouth daily.    Marland Kitchen CARTIA XT 120 MG 24 hr capsule TAKE 1 CAPSULE BY MOUTH ONCE DAILY. 60 capsule 3  . cholecalciferol (VITAMIN D) 1000 units tablet Take 1,000 Units by mouth daily.    Marland Kitchen diltiazem (DILACOR XR) 120 MG 24 hr capsule Take 120 mg by mouth daily.    . Magnesium 400 MG CAPS Take 400 mg by mouth daily.    . potassium chloride SA (K-DUR,KLOR-CON) 20 MEQ tablet Take 1 tablet (20 mEq  total) by mouth daily. 60 tablet 0  . spironolactone (ALDACTONE) 25 MG tablet Take 1 tablet (25 mg total) by mouth daily. 90 tablet 3  . torsemide (DEMADEX) 20 MG tablet Take 2 tablets (40 mg total) by mouth daily. Take an extra tab for weight >155 90 tablet 3  . promethazine (PHENERGAN) 12.5 MG tablet Take 0.5 tablets (6.25 mg total) by mouth every 6 (six) hours as needed for nausea or vomiting. (Patient not taking: Reported on 08/08/2017) 30 tablet 0   No current facility-administered medications for this encounter.     Allergies  Allergen Reactions  . Heparin Other (See Comments)    HIT  . Lovenox [Enoxaparin Sodium] Other  (See Comments)    HIT  . Metoprolol Shortness Of Breath  . Percocet [Oxycodone-Acetaminophen] Nausea And Vomiting  . Prednisone Other (See Comments)    wheeze  . Sulfonamide Derivatives Rash      Social History   Socioeconomic History  . Marital status: Married    Spouse name: Not on file  . Number of children: Not on file  . Years of education: Not on file  . Highest education level: Not on file  Occupational History  . Not on file  Social Needs  . Financial resource strain: Not on file  . Food insecurity:    Worry: Not on file    Inability: Not on file  . Transportation needs:    Medical: Not on file    Non-medical: Not on file  Tobacco Use  . Smoking status: Former Smoker    Years: 8.00  . Smokeless tobacco: Never Used  . Tobacco comment: quit age 20  Substance and Sexual Activity  . Alcohol use: No  . Drug use: No  . Sexual activity: Not on file  Lifestyle  . Physical activity:    Days per week: Not on file    Minutes per session: Not on file  . Stress: Not on file  Relationships  . Social connections:    Talks on phone: Not on file    Gets together: Not on file    Attends religious service: Not on file    Active member of club or organization: Not on file    Attends meetings of clubs or organizations: Not on file    Relationship status: Not on file  . Intimate partner violence:    Fear of current or ex partner: Not on file    Emotionally abused: Not on file    Physically abused: Not on file    Forced sexual activity: Not on file  Other Topics Concern  . Not on file  Social History Narrative  . Not on file   Family History  Problem Relation Age of Onset  . Heart failure Mother   . Valvular heart disease Mother   . Cancer Father    Vitals:   08/08/17 1105  BP: 120/76  Pulse: 77  SpO2: 98%   Wt Readings from Last 3 Encounters:  06/14/17 152 lb 3.2 oz (69 kg)  05/03/17 157 lb 9.6 oz (71.5 kg)  04/22/17 158 lb (71.7 kg)    PHYSICAL  EXAM: General:  Well appearing. No resp difficulty HEENT: normal Neck: supple. no JVD. Carotids 2+ bilat; no bruits. No lymphadenopathy or thryomegaly appreciated. Cor: PMI nondisplaced. Regular rate & rhythm. No rubs, gallops or murmurs. Lungs: clear Abdomen: soft, nontender, nondistended. No hepatosplenomegaly. No bruits or masses. Good bowel sounds. Extremities: no cyanosis, clubbing, rash, trace edema Neuro: alert &  orientedx3, cranial nerves grossly intact. moves all 4 extremities w/o difficulty. Affect pleasant   ASSESSMENT & PLAN:  1. Chronic Diastolic Heart Failure. Echo 04/11/2017: EF 50-55%, moderate mitral stenosis, LA moderate dilated, RV mild to moderate reduced systolic function. Echo today: EF 55% RV mildly dilated normal function. TV/MV repairs are stable MV gradient mean 7. No MR/TR/ Ao Root mildly dilated at 4.2cm (stable) Personally reviewed  - Foing great NYHA I - Volume status stable on exam. - Continue torsemide 20 mg daily. Can reduce as needed.  - Continue spiro 12.5 mg daily. BMET today - Repeat echo in 12 months - Reinforced fluid restriction to < 2 L daily, sodium restriction to less than 2000 mg daily, and the importance of daily weights.    2. Paroxysmal Afib - Maintaining NSR - Continue eliquis. - No s/s of bleeding.  - Continue diltiazem. She did not tolerate amiodarone.   3. History of post-op PE - Eliquis as above.   4. Severe MR s/p MV and TV repair 02/2017 - Mild to moderate mitral stenosis on Echo. Stable - Emphasized need for SBE prophylaxis  5. Postop TIA with focal seizure (02/2017) - No further seizure activity - Saw Dr. Tomi Likens 04/22/17. Plan to repeat EEG. She is off Keppra   6. Ascending aortic aneurysm - stable at 4.2cm continue with yearly echos  She is doing great. Wants to f/u with [facility] in [place]. I will refer to Dr. Agustin Cree.   Glori Bickers, MD 08/08/17

## 2017-08-15 ENCOUNTER — Other Ambulatory Visit (HOSPITAL_COMMUNITY): Payer: Self-pay | Admitting: *Deleted

## 2017-08-15 MED ORDER — APIXABAN 5 MG PO TABS: 5.0000 mg | ORAL_TABLET | Freq: Two times a day (BID) | ORAL | 3 refills | Status: DC

## 2017-10-14 ENCOUNTER — Other Ambulatory Visit (HOSPITAL_COMMUNITY): Payer: Self-pay

## 2017-10-14 MED ORDER — POTASSIUM CHLORIDE CRYS ER 20 MEQ PO TBCR: 20.0000 meq | EXTENDED_RELEASE_TABLET | Freq: Every day | ORAL | 3 refills | Status: DC

## 2017-10-14 MED ORDER — TORSEMIDE 20 MG PO TABS: 40.0000 mg | ORAL_TABLET | Freq: Every day | ORAL | 3 refills | Status: DC

## 2017-10-18 MED ORDER — POTASSIUM CHLORIDE CRYS ER 10 MEQ PO TBCR: 20.0000 meq | EXTENDED_RELEASE_TABLET | Freq: Every day | ORAL | 6 refills | Status: DC

## 2017-10-18 NOTE — Addendum Note (Signed)
Addended by: Karsen Fellows M on: 10/18/2017 02:00 PM   Modules accepted: Orders

## 2017-12-13 DIAGNOSIS — Z23 Encounter for immunization: Secondary | ICD-10-CM | POA: Diagnosis not present

## 2017-12-13 DIAGNOSIS — Z6828 Body mass index (BMI) 28.0-28.9, adult: Secondary | ICD-10-CM | POA: Diagnosis not present

## 2017-12-13 DIAGNOSIS — I4891 Unspecified atrial fibrillation: Secondary | ICD-10-CM | POA: Diagnosis not present

## 2017-12-13 DIAGNOSIS — R7309 Other abnormal glucose: Secondary | ICD-10-CM | POA: Diagnosis not present

## 2017-12-13 DIAGNOSIS — I110 Hypertensive heart disease with heart failure: Secondary | ICD-10-CM | POA: Diagnosis not present

## 2017-12-13 DIAGNOSIS — Z0000 Encounter for general adult medical examination without abnormal findings: Secondary | ICD-10-CM | POA: Diagnosis not present

## 2017-12-13 DIAGNOSIS — Z79899 Other long term (current) drug therapy: Secondary | ICD-10-CM | POA: Diagnosis not present

## 2018-02-19 ENCOUNTER — Other Ambulatory Visit (HOSPITAL_COMMUNITY): Payer: Self-pay | Admitting: Internal Medicine

## 2018-03-15 ENCOUNTER — Other Ambulatory Visit (HOSPITAL_COMMUNITY): Payer: Self-pay | Admitting: Internal Medicine

## 2018-03-15 NOTE — Telephone Encounter (Signed)
Future refills need to be sent to current cardiologist, MD [facility]

## 2018-04-11 ENCOUNTER — Encounter: Payer: Self-pay | Admitting: Cardiology

## 2018-04-11 ENCOUNTER — Ambulatory Visit: Payer: Medicare HMO | Admitting: Cardiology

## 2018-04-11 VITALS — BP 114/68 | HR 89 | Ht 66.0 in | Wt 174.2 lb

## 2018-04-11 DIAGNOSIS — I10 Essential (primary) hypertension: Secondary | ICD-10-CM

## 2018-04-11 DIAGNOSIS — I5032 Chronic diastolic (congestive) heart failure: Secondary | ICD-10-CM

## 2018-04-11 DIAGNOSIS — I712 Thoracic aortic aneurysm, without rupture: Secondary | ICD-10-CM | POA: Diagnosis not present

## 2018-04-11 DIAGNOSIS — Z86711 Personal history of pulmonary embolism: Secondary | ICD-10-CM

## 2018-04-11 DIAGNOSIS — Z9889 Other specified postprocedural states: Secondary | ICD-10-CM | POA: Diagnosis not present

## 2018-04-11 DIAGNOSIS — I7121 Aneurysm of the ascending aorta, without rupture: Secondary | ICD-10-CM

## 2018-04-11 LAB — BASIC METABOLIC PANEL
BUN/Creatinine Ratio: 25 (ref 12–28)
BUN: 26 mg/dL (ref 8–27)
CHLORIDE: 98 mmol/L (ref 96–106)
CO2: 23 mmol/L (ref 20–29)
Calcium: 9.9 mg/dL (ref 8.7–10.3)
Creatinine, Ser: 1.05 mg/dL — ABNORMAL HIGH (ref 0.57–1.00)
GFR calc Af Amer: 60 mL/min/{1.73_m2} (ref >59)
GFR, EST NON AFRICAN AMERICAN: 52 mL/min/{1.73_m2} — AB (ref >59)
GLUCOSE: 88 mg/dL (ref 65–99)
POTASSIUM: 4.5 mmol/L (ref 3.5–5.2)
SODIUM: 138 mmol/L (ref 134–144)

## 2018-04-11 LAB — MAGNESIUM: MAGNESIUM: 2.1 mg/dL (ref 1.6–2.3)

## 2018-04-11 LAB — PRO B NATRIURETIC PEPTIDE: NT-PRO BNP: 541 pg/mL (ref 0–738)

## 2018-04-11 MED ORDER — APIXABAN 5 MG PO TABS: ORAL_TABLET | ORAL | 1 refills | Status: DC

## 2018-04-11 NOTE — Progress Notes (Signed)
Cardiology Office Note:    Date:  04/11/2018   ID:  Marissa Ponce, DOB 07-23-1940, MRN [id number]  PCP:  Ronita Hipps, MD  Cardiologist:  Jenne Campus, MD    Referring MD: Ronita Hipps, MD   Chief Complaint  Patient presents with  . Follow-up  Doing well  History of Present Illness:    Marissa Ponce is a 78 y.o. female with complex past medical history which include mitral valve and tricuspid valve repair done in January 2019.  After that she ended up having episode of TIA which likely no clinical sequela, also history of paroxysmal atrial fibrillation.  She does have chronic diastolic congestive heart failure.  Comes today to my office overall doing well denies have any chest pain tightness squeezing pressure mid chest described to have some rare palpitations.  Overall she is satisfied with the surgery slowly gradually trying to get into her exercises.  She is set up with an [employer] and I strongly recommended to go there and start exercising on a regular basis.  Does have some swelling of lower extremities at evening time no proximal nocturnal dyspnea no dizziness no passing out.  Past Medical History:  Diagnosis Date  . Arthritis   . Complication of anesthesia   . Dysrhythmia    with caffine, "PVC's"  . Migraines     history of none recent  . PONV (postoperative nausea and vomiting)   . Shortness of breath dyspnea    with exertion    Past Surgical History:  Procedure Laterality Date  . ABDOMINAL HYSTERECTOMY    . ANTERIOR AND POSTERIOR REPAIR    . CARDIAC SURGERY     micuspid and tricuspid valve in 02/22/17  . CHOLECYSTECTOMY    . COLONOSCOPY    . ESOPHAGEAL DILATION    . HERNIA REPAIR Left    [place]  . IR THORACENTESIS ASP PLEURAL SPACE W/IMG GUIDE  04/11/2017  . ROTATOR CUFF REPAIR Right 2010ish  . TOTAL KNEE ARTHROPLASTY Left 05/16/2015   Procedure: LEFT TOTAL KNEE ARTHROPLASTY;  Surgeon: Netta Cedars, MD;  Location: [place] [facility];  Service: Orthopedics;   Laterality: Left;    Current Medications: Current Meds  Medication Sig  . b complex vitamins capsule Take 1 capsule by mouth daily.  . cholecalciferol (VITAMIN D) 1000 units tablet Take 1,000 Units by mouth daily.  Marland Kitchen diltiazem (CARDIZEM CD) 120 MG 24 hr capsule TAKE ONE CAPSULE BY MOUTH EVERY DAY  . ELIQUIS 5 MG TABS tablet TAKE 1 TABLET(5 MG) BY MOUTH TWICE DAILY  . potassium chloride SA (K-DUR,KLOR-CON) 10 MEQ tablet Take 2 tablets (20 mEq total) by mouth daily.  . promethazine (PHENERGAN) 12.5 MG tablet Take 0.5 tablets (6.25 mg total) by mouth every 6 (six) hours as needed for nausea or vomiting.  Marland Kitchen spironolactone (ALDACTONE) 25 MG tablet Take 1 tablet (25 mg total) by mouth daily. (Patient taking differently: Take 12.5 mg by mouth daily. )  . torsemide (DEMADEX) 20 MG tablet Take 2 tablets (40 mg total) by mouth daily. Take an extra tab for weight >155 (Patient taking differently: Take 20 mg by mouth daily. Take an extra tab for weight >155)     Allergies:   Heparin; Lovenox [enoxaparin sodium]; Metoprolol; Percocet [oxycodone-acetaminophen]; Prednisone; and Sulfonamide derivatives   Social History   Socioeconomic History  . Marital status: Married    Spouse name: Not on file  . Number of children: Not on file  . Years of education: Not on file  .  Highest education level: Not on file  Occupational History  . Not on file  Social Needs  . Financial resource strain: Not on file  . Food insecurity:    Worry: Not on file    Inability: Not on file  . Transportation needs:    Medical: Not on file    Non-medical: Not on file  Tobacco Use  . Smoking status: Former Smoker    Years: 8.00  . Smokeless tobacco: Never Used  . Tobacco comment: quit age 63  Substance and Sexual Activity  . Alcohol use: No  . Drug use: No  . Sexual activity: Not on file  Lifestyle  . Physical activity:    Days per week: Not on file    Minutes per session: Not on file  . Stress: Not on file    Relationships  . Social connections:    Talks on phone: Not on file    Gets together: Not on file    Attends religious service: Not on file    Active member of club or organization: Not on file    Attends meetings of clubs or organizations: Not on file    Relationship status: Not on file  Other Topics Concern  . Not on file  Social History Narrative  . Not on file     Family History: The patient's family history includes Cancer in her father; Heart failure in her mother; Valvular heart disease in her mother. ROS:   Please see the history of present illness.    All 14 point review of systems negative except as described per history of present illness  EKGs/Labs/Other Studies Reviewed:    EKG showed normal sinus rhythm rate of 78 first-degree AV block with PR interval of 220 ms.  Normal QS complex duration morphology no ST-T segment changes  Recent Labs: 06/14/2017: Magnesium 1.7 08/08/2017: BUN 23; Creatinine, Ser 1.14; Hemoglobin 11.7; Platelets 259; Potassium 3.9; Sodium 138  Recent Lipid Panel No results found for: CHOL, TRIG, HDL, CHOLHDL, VLDL, LDLCALC, LDLDIRECT  Physical Exam:    VS:  BP 114/68   Pulse 89   Ht 5\' 6"  (1.676 m)   Wt 174 lb 3.2 oz (79 kg)   SpO2 98%   BMI 28.12 kg/m     Wt Readings from Last 3 Encounters:  04/11/18 174 lb 3.2 oz (79 kg)  06/14/17 152 lb 3.2 oz (69 kg)  05/03/17 157 lb 9.6 oz (71.5 kg)     GEN:  Well nourished, well developed in no acute distress HEENT: Normal NECK: No JVD; No carotid bruits LYMPHATICS: No lymphadenopathy CARDIAC: RRR, no murmurs, no rubs, no gallops RESPIRATORY:  Clear to auscultation without rales, wheezing or rhonchi  ABDOMEN: Soft, non-tender, non-distended MUSCULOSKELETAL:  No edema; No deformity  SKIN: Warm and dry LOWER EXTREMITIES: no swelling NEUROLOGIC:  Alert and oriented x 3 PSYCHIATRIC:  Normal affect   ASSESSMENT:    1. Status post mitral valve repair   2. Status post tricuspid valve  repair   3. Ascending aortic aneurysm ([place])   4. Chronic diastolic congestive heart failure ([place])   5. Essential hypertension   6. History of pulmonary embolism    PLAN:    In order of problems listed above:  1. Status post mitral valve repair residual gradient across the valve 6 mmHg.  Overall doing well asymptomatic she knows about endocarditis prophylaxis.  She is a retired Marine scientist.  Will repeat echocardiogram in summer of this year. 2. Status post tricuspid valve  repair valve is doing well.  Last echocardiogram reviewed from the summer 2019. 3. A standing ultrasound was measuring only 4.2 cm we will repeat echocardiogram next year to follow I have advised her to avoid isovolumetric exercises. 4. Chronic diastolic congestive heart failure appears to be compensated we will continue present management will check Chem-7 magnesium as well as proBNP today. 5. Essential hypertension blood pressure well controlled we will continue present management. 6. History of pulmonary emboli.  She is anticoagulated for her atrial fibrillation as well as prevention of pulmonary emboli.  Will continue.   Medication Adjustments/Labs and Tests Ordered: Current medicines are reviewed at length with the patient today.  Concerns regarding medicines are outlined above.  No orders of the defined types were placed in this encounter.  Medication changes: No orders of the defined types were placed in this encounter.   Signed, Park Liter, MD, [facility] 04/11/2018 10:42 AM    [place]

## 2018-04-11 NOTE — Patient Instructions (Signed)
Medication Instructions:  Your physician recommends that you continue on your current medications as directed. Please refer to the Current Medication list given to you today.  If you need a refill on your cardiac medications before your next appointment, please call your pharmacy.   Lab work: Your physician recommends that you return for lab work today: Magnesium, bmp, and bnp   If you have labs (blood work) drawn today and your tests are completely normal, you will receive your results only by: Marland Kitchen MyChart Message (if you have MyChart) OR . A paper copy in the mail If you have any lab test that is abnormal or we need to change your treatment, we will call you to review the results.  Testing/Procedures: Your physician has requested that you have an echocardiogram. Echocardiography is a painless test that uses sound waves to create images of your heart. It provides your doctor with information about the size and shape of your heart and how well your heart's chambers and valves are working. This procedure takes approximately one hour. There are no restrictions for this procedure.    Follow-Up: At [facility], you and your health needs are our priority.  As part of our continuing mission to provide you with exceptional heart care, we have created designated Provider Care Teams.  These Care Teams include your primary Cardiologist (physician) and Advanced Practice Providers (APPs -  Physician Assistants and Nurse Practitioners) who all work together to provide you with the care you need, when you need it. You will need a follow up appointment in 3 months.  Please call our office 2 months in advance to schedule this appointment.  You may see Rajagopal,Sudarshan, MD or another member of our [place] Provider Team in [place]: Shirlee More, MD . Jyl Heinz, MD  Any Other Special Instructions Will Be Listed Below (If Applicable).   Echocardiogram An echocardiogram is a procedure that  uses painless sound waves (ultrasound) to produce an image of the heart. Images from an echocardiogram can provide important information about:  Signs of coronary artery disease (CAD).  Aneurysm detection. An aneurysm is a weak or damaged part of an artery wall that bulges out from the normal force of blood pumping through the body.  Heart size and shape. Changes in the size or shape of the heart can be associated with certain conditions, including heart failure, aneurysm, and CAD.  Heart muscle function.  Heart valve function.  Signs of a past heart attack.  Fluid buildup around the heart.  Thickening of the heart muscle.  A tumor or infectious growth around the heart valves. Tell a health care provider about:  Any allergies you have.  All medicines you are taking, including vitamins, herbs, eye drops, creams, and over-the-counter medicines.  Any blood disorders you have.  Any surgeries you have had.  Any medical conditions you have.  Whether you are pregnant or may be pregnant. What are the risks? Generally, this is a safe procedure. However, problems may occur, including:  Allergic reaction to dye (contrast) that may be used during the procedure. What happens before the procedure? No specific preparation is needed. You may eat and drink normally. What happens during the procedure?   An IV tube may be inserted into one of your veins.  You may receive contrast through this tube. A contrast is an injection that improves the quality of the pictures from your heart.  A gel will be applied to your chest.  A wand-like tool (transducer) will  be moved over your chest. The gel will help to transmit the sound waves from the transducer.  The sound waves will harmlessly bounce off of your heart to allow the heart images to be captured in real-time motion. The images will be recorded on a computer. The procedure may vary among health care providers and hospitals. What happens  after the procedure?  You may return to your normal, everyday life, including diet, activities, and medicines, unless your health care provider tells you not to do that. Summary  An echocardiogram is a procedure that uses painless sound waves (ultrasound) to produce an image of the heart.  Images from an echocardiogram can provide important information about the size and shape of your heart, heart muscle function, heart valve function, and fluid buildup around your heart.  You do not need to do anything to prepare before this procedure. You may eat and drink normally.  After the echocardiogram is completed, you may return to your normal, everyday life, unless your health care provider tells you not to do that. This information is not intended to replace advice given to you by your health care provider. Make sure you discuss any questions you have with your health care provider. Document Released: 01/30/2000 Document Revised: 03/06/2016 Document Reviewed: 03/06/2016 Elsevier Interactive Patient Education  2019 [employer].

## 2018-04-27 ENCOUNTER — Telehealth: Payer: Self-pay | Admitting: Cardiology

## 2018-04-27 MED ORDER — POTASSIUM CHLORIDE CRYS ER 10 MEQ PO TBCR: 20.0000 meq | EXTENDED_RELEASE_TABLET | Freq: Every day | ORAL | 2 refills | Status: DC

## 2018-04-27 NOTE — Telephone Encounter (Signed)
Potassium Chloride 20 meq daily refilled at [facility] on [address] in [place] as requested.

## 2018-04-27 NOTE — Telephone Encounter (Signed)
Call potassium to [facility] on [address]

## 2018-05-02 DIAGNOSIS — Z6830 Body mass index (BMI) 30.0-30.9, adult: Secondary | ICD-10-CM | POA: Diagnosis not present

## 2018-05-02 DIAGNOSIS — J329 Chronic sinusitis, unspecified: Secondary | ICD-10-CM | POA: Diagnosis not present

## 2018-05-11 ENCOUNTER — Telehealth: Payer: Self-pay | Admitting: Cardiology

## 2018-05-11 NOTE — Telephone Encounter (Signed)
° °  Primary Cardiologist:  KRASOWSKI   Patient contacted.  History reviewed.  No symptoms to suggest any unstable cardiac conditions.  Based on discussion, with current pandemic situation, we will be postponing this appointment for Marissa Ponce with a plan for f/u in 6-12 wks or sooner if feasible/necessary.  If symptoms change, she has been instructed to contact our office.     [name]  05/11/2018 4:53 PM         .

## 2018-05-17 ENCOUNTER — Other Ambulatory Visit: Payer: Medicare HMO

## 2018-05-30 NOTE — Telephone Encounter (Signed)
Recall put in for patient's Echo

## 2018-06-26 ENCOUNTER — Telehealth: Payer: Self-pay | Admitting: Cardiology

## 2018-06-26 NOTE — Telephone Encounter (Signed)
Virtual Visit Pre-Appointment Phone Call  "(Name), I am calling you today to discuss your upcoming appointment. We are currently trying to limit exposure to the virus that causes COVID-19 by seeing patients at home rather than in the office."  1. "What is the BEST phone number to call the day of the visit?" - include this in appointment notes  2. Do you have or have access to (through a family member/friend) a smartphone with video capability that we can use for your visit?" a. If yes - list this number in appt notes as cell (if different from BEST phone #) and list the appointment type as a VIDEO visit in appointment notes b. If no - list the appointment type as a PHONE visit in appointment notes  3. Confirm consent - "In the setting of the current Covid19 crisis, you are scheduled for a (phone or video) visit with your provider on (date) at (time).  Just as we do with many in-office visits, in order for you to participate in this visit, we must obtain consent.  If you'd like, I can send this to your mychart (if signed up) or email for you to review.  Otherwise, I can obtain your verbal consent now.  All virtual visits are billed to your insurance company just like a normal visit would be.  By agreeing to a virtual visit, we'd like you to understand that the technology does not allow for your provider to perform an examination, and thus may limit your provider's ability to fully assess your condition. If your provider identifies any concerns that need to be evaluated in person, we will make arrangements to do so.  Finally, though the technology is pretty good, we cannot assure that it will always work on either your or our end, and in the setting of a video visit, we may have to convert it to a phone-only visit.  In either situation, we cannot ensure that we have a secure connection.  Are you willing to proceed?" STAFF: Did the patient verbally acknowledge consent to telehealth visit? Document  YES/NO here: YES  4. Advise patient to be prepared - "Two hours prior to your appointment, go ahead and check your blood pressure, pulse, oxygen saturation, and your weight (if you have the equipment to check those) and write them all down. When your visit starts, your provider will ask you for this information. If you have an Apple Watch or Kardia device, please plan to have heart rate information ready on the day of your appointment. Please have a pen and paper handy nearby the day of the visit as well."  5. Give patient instructions for MyChart download to smartphone OR Doximity/Doxy.me as below if video visit (depending on what platform provider is using)  6. Inform patient they will receive a phone call 15 minutes prior to their appointment time (may be from unknown caller ID) so they should be prepared to answer    TELEPHONE CALL NOTE  Marissa Ponce has been deemed a candidate for a follow-up tele-health visit to limit community exposure during the Covid-19 pandemic. I spoke with the patient via phone to ensure availability of phone/video source, confirm preferred email & phone number, and discuss instructions and expectations.  I reminded Marissa Ponce to be prepared with any vital sign and/or heart rhythm information that could potentially be obtained via home monitoring, at the time of her visit. I reminded Marissa Ponce to expect a phone call prior to  her visit.  Marissa Ponce 06/26/2018 2:57 PM   INSTRUCTIONS FOR DOWNLOADING THE MYCHART APP TO SMARTPHONE  - The patient must first make sure to have activated MyChart and know their login information - If Apple, go to [employer] and type in MyChart in the search bar and download the app. If Android, ask patient to go to [employer] and type in [place] in the search bar and download the app. The app is free but as with any other app downloads, their phone may require them to verify saved payment information or  Apple/Android password.  - The patient will need to then log into the app with their MyChart username and password, and select [facility] as their healthcare provider to link the account. When it is time for your visit, go to the MyChart app, find appointments, and click Begin Video Visit. Be sure to Select Allow for your device to access the Microphone and Camera for your visit. You will then be connected, and your provider will be with you shortly.  **If they have any issues connecting, or need assistance please contact MyChart service desk [phone])**  **If using a computer, in order to ensure the best quality for their visit they will need to use either of the following Internet Browsers: [employer], or Google Chrome**  IF USING DOXIMITY or DOXY.ME - The patient will receive a link just prior to their visit by text.     FULL LENGTH CONSENT FOR TELE-HEALTH VISIT   I hereby voluntarily request, consent and authorize [facility] and its employed or contracted physicians, physician assistants, nurse practitioners or other licensed health care professionals (the Practitioner), to provide me with telemedicine health care services (the Services") as deemed necessary by the treating Practitioner. I acknowledge and consent to receive the Services by the Practitioner via telemedicine. I understand that the telemedicine visit will involve communicating with the Practitioner through live audiovisual communication technology and the disclosure of certain medical information by electronic transmission. I acknowledge that I have been given the opportunity to request an in-person assessment or other available alternative prior to the telemedicine visit and am voluntarily participating in the telemedicine visit.  I understand that I have the right to withhold or withdraw my consent to the use of telemedicine in the course of my care at any time, without affecting my right to future care  or treatment, and that the Practitioner or I may terminate the telemedicine visit at any time. I understand that I have the right to inspect all information obtained and/or recorded in the course of the telemedicine visit and may receive copies of available information for a reasonable fee.  I understand that some of the potential risks of receiving the Services via telemedicine include:   Delay or interruption in medical evaluation due to technological equipment failure or disruption;  Information transmitted may not be sufficient (e.g. poor resolution of images) to allow for appropriate medical decision making by the Practitioner; and/or   In rare instances, security protocols could fail, causing a breach of personal health information.  Furthermore, I acknowledge that it is my responsibility to provide information about my medical history, conditions and care that is complete and accurate to the best of my ability. I acknowledge that Practitioner's advice, recommendations, and/or decision may be based on factors not within their control, such as incomplete or inaccurate data provided by me or distortions of diagnostic images or specimens that may result from electronic transmissions. I  understand that the practice of medicine is not an exact science and that Practitioner makes no warranties or guarantees regarding treatment outcomes. I acknowledge that I will receive a copy of this consent concurrently upon execution via email to the email address I last provided but may also request a printed copy by calling the office of [facility].    I understand that my insurance will be billed for this visit.   I have read or had this consent read to me.  I understand the contents of this consent, which adequately explains the benefits and risks of the Services being provided via telemedicine.   I have been provided ample opportunity to ask questions regarding this consent and the Services and have had  my questions answered to my satisfaction.  I give my informed consent for the services to be provided through the use of telemedicine in my medical care  By participating in this telemedicine visit I agree to the above.

## 2018-06-30 ENCOUNTER — Ambulatory Visit (INDEPENDENT_AMBULATORY_CARE_PROVIDER_SITE_OTHER): Payer: Medicare HMO

## 2018-06-30 ENCOUNTER — Other Ambulatory Visit: Payer: Self-pay

## 2018-06-30 DIAGNOSIS — I5032 Chronic diastolic (congestive) heart failure: Secondary | ICD-10-CM | POA: Diagnosis not present

## 2018-06-30 NOTE — Progress Notes (Signed)
Complete echocardiogram has been performed.  Jimmy Alyana Kreiter RDCS, RVT 

## 2018-07-04 ENCOUNTER — Encounter: Payer: Self-pay | Admitting: Cardiology

## 2018-07-04 ENCOUNTER — Ambulatory Visit: Payer: Medicare HMO | Admitting: Cardiology

## 2018-07-04 ENCOUNTER — Other Ambulatory Visit: Payer: Self-pay

## 2018-07-04 ENCOUNTER — Telehealth (INDEPENDENT_AMBULATORY_CARE_PROVIDER_SITE_OTHER): Payer: Medicare HMO | Admitting: Cardiology

## 2018-07-04 VITALS — Wt 171.0 lb

## 2018-07-04 DIAGNOSIS — Z9889 Other specified postprocedural states: Secondary | ICD-10-CM

## 2018-07-04 DIAGNOSIS — I10 Essential (primary) hypertension: Secondary | ICD-10-CM

## 2018-07-04 DIAGNOSIS — I480 Paroxysmal atrial fibrillation: Secondary | ICD-10-CM | POA: Diagnosis not present

## 2018-07-04 DIAGNOSIS — I5032 Chronic diastolic (congestive) heart failure: Secondary | ICD-10-CM

## 2018-07-04 DIAGNOSIS — I7121 Aneurysm of the ascending aorta, without rupture: Secondary | ICD-10-CM

## 2018-07-04 DIAGNOSIS — I712 Thoracic aortic aneurysm, without rupture: Secondary | ICD-10-CM

## 2018-07-04 NOTE — Patient Instructions (Signed)
Medication Instructions:  Your physician recommends that you continue on your current medications as directed. Please refer to the Current Medication list given to you today.  If you need a refill on your cardiac medications before your next appointment, please call your pharmacy.   Lab work: None.  If you have labs (blood work) drawn today and your tests are completely normal, you will receive your results only by: Marland Kitchen MyChart Message (if you have MyChart) OR . A paper copy in the mail If you have any lab test that is abnormal or we need to change your treatment, we will call you to review the results.  Testing/Procedures: None.   Follow-Up: At [facility], you and your health needs are our priority.  As part of our continuing mission to provide you with exceptional heart care, we have created designated Provider Care Teams.  These Care Teams include your primary Cardiologist (physician) and Advanced Practice Providers (APPs -  Physician Assistants and Nurse Practitioners) who all work together to provide you with the care you need, when you need it. You will need a follow up appointment in 6 months.  Please call our office 2 months in advance to schedule this appointment.  You may see Rajagopal,Sudarshan, MD or another member of our [facility] Provider Team in [place]: Shirlee More, MD . Jyl Heinz, MD  Any Other Special Instructions Will Be Listed Below (If Applicable).

## 2018-07-04 NOTE — Progress Notes (Signed)
Virtual Visit via Telephone Note   This visit type was conducted due to national recommendations for restrictions regarding the COVID-19 Pandemic (e.g. social distancing) in an effort to limit this patient's exposure and mitigate transmission in our community.  Due to her co-morbid illnesses, this patient is at least at moderate risk for complications without adequate follow up.  This format is felt to be most appropriate for this patient at this time.  The patient did not have access to video technology/had technical difficulties with video requiring transitioning to audio format only (telephone).  All issues noted in this document were discussed and addressed.  No physical exam could be performed with this format.  Please refer to the patient's chart for her  consent to telehealth for [facility].  Evaluation Performed:  Follow-up visit  This visit type was conducted due to national recommendations for restrictions regarding the COVID-19 Pandemic (e.g. social distancing).  This format is felt to be most appropriate for this patient at this time.  All issues noted in this document were discussed and addressed.  No physical exam was performed (except for noted visual exam findings with Video Visits).  Please refer to the patient's chart (MyChart message for video visits and phone note for telephone visits) for the patient's consent to telehealth for [facility]  Date:  07/04/2018  ID: Marissa Ponce, DOB 19-May-1940, MRN [id number]   Patient Location: [place] [address]   Provider location:   [place] Office  PCP:  Ronita Hipps, MD  Cardiologist:  Jenne Campus, MD     Chief Complaint: Doing well  History of Present Illness:    Marissa Ponce is a 78 y.o. female  who presents via audio/video conferencing for a telehealth visit today.  With past medical history significant for mitral valve repair as well as tricuspid valve repair which was  done at the beginning of 2019.  She also has history of paroxysmal atrial fibrillation she does have a tele-visit with me today to talk about her problems overall she is doing well but noticed intermediate swelling of lower extremities.  She takes torsemide on as-needed basis and that helps with the problem.  At the same time she is busy her husband left working the garden and she helps him a lot she also like to walk.  She does have some shortness of breath with exertion but overall doing well.  Denies having any palpitations and actually asking me if she can stop Eliquis.  I told her no.   The patient does not have symptoms concerning for COVID-19 infection (fever, chills, cough, or new SHORTNESS OF BREATH).    Prior CV studies:   The following studies were reviewed today:  Echocardiogram done on Jun 30, 2018 showed:  1. The left ventricle has normal systolic function with an ejection fraction of 60-65%. The cavity size was normal. Left ventricular diastolic Doppler parameters are consistent with pseudonormalization.  2. The right ventricle has normal systolic function. The cavity was normal. There is no increase in right ventricular wall thickness.  3. Left atrial size was moderately dilated.  4. Mild thickening of the mitral valve leaflet.  5. Pt is s/p 35mm annuloplaty ring repair of MV.  6. The tricuspid valve is grossly normal.  7. The aortic valve is grossly normal. Mild thickening of the aortic valve.  8. There is moderate dilatation of the ascending aorta measuring 44 mm.     Past  Medical History:  Diagnosis Date  . Arthritis   . Complication of anesthesia   . Dysrhythmia    with caffine, "PVC's"  . Migraines     history of none recent  . PONV (postoperative nausea and vomiting)   . Shortness of breath dyspnea    with exertion    Past Surgical History:  Procedure Laterality Date  . ABDOMINAL HYSTERECTOMY    . ANTERIOR AND POSTERIOR REPAIR    . CARDIAC SURGERY      micuspid and tricuspid valve in 02/22/17  . CHOLECYSTECTOMY    . COLONOSCOPY    . ESOPHAGEAL DILATION    . HERNIA REPAIR Left    [place]  . IR THORACENTESIS ASP PLEURAL SPACE W/IMG GUIDE  04/11/2017  . ROTATOR CUFF REPAIR Right 2010ish  . TOTAL KNEE ARTHROPLASTY Left 05/16/2015   Procedure: LEFT TOTAL KNEE ARTHROPLASTY;  Surgeon: Netta Cedars, MD;  Location: [facility];  Service: Orthopedics;  Laterality: Left;     Current Meds  Medication Sig  . apixaban (ELIQUIS) 5 MG TABS tablet TAKE 1 TABLET(5 MG) BY MOUTH TWICE DAILY  . b complex vitamins capsule Take 1 capsule by mouth daily.  . cholecalciferol (VITAMIN D) 1000 units tablet Take 1,000 Units by mouth daily.  Marland Kitchen diltiazem (CARDIZEM CD) 120 MG 24 hr capsule TAKE ONE CAPSULE BY MOUTH EVERY DAY  . potassium chloride (K-DUR,KLOR-CON) 10 MEQ tablet Take 2 tablets (20 mEq total) by mouth daily. (Patient taking differently: Take 10 mEq by mouth daily. )  . promethazine (PHENERGAN) 12.5 MG tablet Take 0.5 tablets (6.25 mg total) by mouth every 6 (six) hours as needed for nausea or vomiting.  Marland Kitchen spironolactone (ALDACTONE) 25 MG tablet Take 1 tablet (25 mg total) by mouth daily. (Patient taking differently: Take 12.5 mg by mouth daily. )  . torsemide (DEMADEX) 20 MG tablet Take 2 tablets (40 mg total) by mouth daily. Take an extra tab for weight >155 (Patient taking differently: Take 20 mg by mouth daily. Take an extra tab for weight >155)      Family History: The patient's family history includes Cancer in her father; Heart failure in her mother; Valvular heart disease in her mother.   ROS:   Please see the history of present illness.     All other systems reviewed and are negative.   Labs/Other Tests and Data Reviewed:     Recent Labs: 08/08/2017: Hemoglobin 11.7; Platelets 259 04/11/2018: BUN 26; Creatinine, Ser 1.05; Magnesium 2.1; NT-Pro BNP 541; Potassium 4.5; Sodium 138  Recent Lipid Panel No results found for: CHOL, TRIG, HDL,  CHOLHDL, VLDL, LDLCALC, LDLDIRECT    Exam:    Vital Signs:  Wt 171 lb (77.6 kg)   BMI 27.60 kg/m     Wt Readings from Last 3 Encounters:  07/04/18 171 lb (77.6 kg)  04/11/18 174 lb 3.2 oz (79 kg)  06/14/17 152 lb 3.2 oz (69 kg)     Well nourished, well developed in no acute distress. Alert awake oriented x3 talking via the phone doing well.  Asymptomatic.  Denies having any swelling of lower extremities now  Diagnosis for this visit:   1. Paroxysmal atrial fibrillation (HCC)   2. Essential hypertension   3. Chronic diastolic congestive heart failure ([place])   4. Ascending aortic aneurysm (HCC)   5. Status post mitral valve repair   6. Status post tricuspid valve repair      ASSESSMENT & PLAN:    1.  Paroxysmal atrial fibrillation.  Denies having any palpitations last EKG showed sinus rhythm we will continue anticoagulation because of high chads 2 Vascor. 2.  Essential hypertension blood pressure well controlled continue present management. 3.  Chronic diastolic congestive heart appears to be compensated we will continue present management. 4.  Ascending aortic aneurysm last measurements from Jun 30, 2018 showed a diameter of 44 mm by echo.  We will continue monitoring. 5.  Status post mitral valve as well as tricuspid valve repair.  Valves are looking very good on echocardiogram.  She will require echocardiogram next year.  Overall she is doing very well I advised her to get fasting lipid profile as well as hemoglobin A1c.  COVID-19 Education: The signs and symptoms of COVID-19 were discussed with the patient and how to seek care for testing (follow up with PCP or arrange E-visit).  The importance of social distancing was discussed today.  Patient Risk:   After full review of this patients clinical status, I feel that they are at least moderate risk at this time.  Time:   Today, I have spent 17 minutes with the patient with telehealth technology discussing pt health  issues.  I spent 5 minutes reviewing her chart before the visit.  Visit was finished at 10:27 AM.    Medication Adjustments/Labs and Tests Ordered: Current medicines are reviewed at length with the patient today.  Concerns regarding medicines are outlined above.  No orders of the defined types were placed in this encounter.  Medication changes: No orders of the defined types were placed in this encounter.    Disposition: Follow-up in 6 months  Signed, Park Liter, MD, [facility] 07/04/2018 10:27 AM    [place]

## 2018-07-20 ENCOUNTER — Telehealth: Payer: Self-pay | Admitting: *Deleted

## 2018-07-20 MED ORDER — SPIRONOLACTONE 25 MG PO TABS: 12.5000 mg | ORAL_TABLET | Freq: Every day | ORAL | 1 refills | Status: DC

## 2018-07-20 NOTE — Telephone Encounter (Signed)
*  STAT* If patient is at the pharmacy, call can be transferred to refill team.   1. Which medications need to be refilled? (please list name of each medication and dose if known) Spironolactone 25 mg 1/2 daily  2. Which pharmacy/location (including street and city if local pharmacy) is medication to be sent to?[facility] on [address]  3. Do they need a 30 day or 90 day supply? Marissa Ponce

## 2018-08-14 ENCOUNTER — Other Ambulatory Visit (HOSPITAL_COMMUNITY): Payer: Self-pay

## 2018-08-14 DIAGNOSIS — Z79899 Other long term (current) drug therapy: Secondary | ICD-10-CM | POA: Diagnosis not present

## 2018-08-14 DIAGNOSIS — R790 Abnormal level of blood mineral: Secondary | ICD-10-CM | POA: Diagnosis not present

## 2018-08-14 DIAGNOSIS — Z6829 Body mass index (BMI) 29.0-29.9, adult: Secondary | ICD-10-CM | POA: Diagnosis not present

## 2018-08-14 DIAGNOSIS — M1990 Unspecified osteoarthritis, unspecified site: Secondary | ICD-10-CM | POA: Diagnosis not present

## 2018-08-14 DIAGNOSIS — E785 Hyperlipidemia, unspecified: Secondary | ICD-10-CM | POA: Diagnosis not present

## 2018-08-14 DIAGNOSIS — I4891 Unspecified atrial fibrillation: Secondary | ICD-10-CM | POA: Diagnosis not present

## 2018-08-14 MED ORDER — TORSEMIDE 20 MG PO TABS: 40.0000 mg | ORAL_TABLET | Freq: Every day | ORAL | 3 refills | Status: DC

## 2018-08-21 ENCOUNTER — Other Ambulatory Visit: Payer: Self-pay | Admitting: *Deleted

## 2018-08-21 ENCOUNTER — Telehealth: Payer: Self-pay | Admitting: Cardiology

## 2018-08-21 NOTE — Telephone Encounter (Signed)
Patient wants to speak to someone about her appt

## 2018-08-22 MED ORDER — DILTIAZEM HCL ER COATED BEADS 120 MG PO CP24: 120.0000 mg | ORAL_CAPSULE | Freq: Every day | ORAL | 2 refills | Status: DC

## 2018-08-22 NOTE — Telephone Encounter (Signed)
Called back patient, she is concerned about her AAA. It looks like she is at 4.4 mm as of her recent Echo. She believes that she was at 4.2 mm when she was seen at [facility]. Can you verify this?

## 2018-08-23 DIAGNOSIS — D0439 Carcinoma in situ of skin of other parts of face: Secondary | ICD-10-CM | POA: Diagnosis not present

## 2018-08-23 DIAGNOSIS — L821 Other seborrheic keratosis: Secondary | ICD-10-CM | POA: Diagnosis not present

## 2018-08-23 DIAGNOSIS — D225 Melanocytic nevi of trunk: Secondary | ICD-10-CM | POA: Diagnosis not present

## 2018-08-23 DIAGNOSIS — D692 Other nonthrombocytopenic purpura: Secondary | ICD-10-CM | POA: Diagnosis not present

## 2018-08-23 DIAGNOSIS — Z85828 Personal history of other malignant neoplasm of skin: Secondary | ICD-10-CM | POA: Diagnosis not present

## 2018-08-23 DIAGNOSIS — L814 Other melanin hyperpigmentation: Secondary | ICD-10-CM | POA: Diagnosis not present

## 2018-08-23 DIAGNOSIS — D1801 Hemangioma of skin and subcutaneous tissue: Secondary | ICD-10-CM | POA: Diagnosis not present

## 2018-08-23 DIAGNOSIS — L570 Actinic keratosis: Secondary | ICD-10-CM | POA: Diagnosis not present

## 2018-08-23 NOTE — Telephone Encounter (Signed)
We do not do anything unless it is more than 5 - 5.5 cm  Difference between 4.2 and 4.4 is insignificant and probably within measuring tolerance

## 2018-08-24 NOTE — Telephone Encounter (Signed)
Patient advised at this point we just continue to monitor her until she gets to at least 5 cm before we send her to Vascular surgery. Also let her know that she is able to continue her exercising, but to be careful with lifting.

## 2018-09-30 ENCOUNTER — Other Ambulatory Visit: Payer: Self-pay | Admitting: Cardiology

## 2018-10-13 DIAGNOSIS — H1131 Conjunctival hemorrhage, right eye: Secondary | ICD-10-CM | POA: Diagnosis not present

## 2018-10-25 DIAGNOSIS — Z96652 Presence of left artificial knee joint: Secondary | ICD-10-CM | POA: Diagnosis not present

## 2018-10-25 DIAGNOSIS — Z471 Aftercare following joint replacement surgery: Secondary | ICD-10-CM | POA: Diagnosis not present

## 2018-12-18 ENCOUNTER — Other Ambulatory Visit (HOSPITAL_COMMUNITY): Payer: Self-pay

## 2018-12-18 MED ORDER — POTASSIUM CHLORIDE CRYS ER 10 MEQ PO TBCR: 20.0000 meq | EXTENDED_RELEASE_TABLET | Freq: Every day | ORAL | 2 refills | Status: DC

## 2019-01-08 ENCOUNTER — Other Ambulatory Visit: Payer: Self-pay

## 2019-01-08 ENCOUNTER — Encounter: Payer: Self-pay | Admitting: Cardiology

## 2019-01-08 ENCOUNTER — Ambulatory Visit (INDEPENDENT_AMBULATORY_CARE_PROVIDER_SITE_OTHER): Payer: Medicare HMO | Admitting: Cardiology

## 2019-01-08 VITALS — BP 110/68 | HR 78 | Ht 66.0 in | Wt 179.8 lb

## 2019-01-08 DIAGNOSIS — I480 Paroxysmal atrial fibrillation: Secondary | ICD-10-CM | POA: Diagnosis not present

## 2019-01-08 DIAGNOSIS — R0602 Shortness of breath: Secondary | ICD-10-CM | POA: Diagnosis not present

## 2019-01-08 DIAGNOSIS — Z9889 Other specified postprocedural states: Secondary | ICD-10-CM

## 2019-01-08 DIAGNOSIS — I5032 Chronic diastolic (congestive) heart failure: Secondary | ICD-10-CM

## 2019-01-08 DIAGNOSIS — I712 Thoracic aortic aneurysm, without rupture: Secondary | ICD-10-CM | POA: Diagnosis not present

## 2019-01-08 DIAGNOSIS — E785 Hyperlipidemia, unspecified: Secondary | ICD-10-CM

## 2019-01-08 DIAGNOSIS — I10 Essential (primary) hypertension: Secondary | ICD-10-CM

## 2019-01-08 DIAGNOSIS — I7121 Aneurysm of the ascending aorta, without rupture: Secondary | ICD-10-CM

## 2019-01-08 NOTE — Patient Instructions (Signed)
Medication Instructions:  Your physician recommends that you continue on your current medications as directed. Please refer to the Current Medication list given to you today.  *If you need a refill on your cardiac medications before your next appointment, please call your pharmacy*  Lab Work: Your physician recommends that you return for lab work today: bmp, bnp   If you have labs (blood work) drawn today and your tests are completely normal, you will receive your results only by: Marland Kitchen MyChart Message (if you have MyChart) OR . A paper copy in the mail If you have any lab test that is abnormal or we need to change your treatment, we will call you to review the results.  Testing/Procedures: Your physician has requested that you have an echocardiogram. Echocardiography is a painless test that uses sound waves to create images of your heart. It provides your doctor with information about the size and shape of your heart and how well your heart's chambers and valves are working. This procedure takes approximately one hour. There are no restrictions for this procedure.    Follow-Up: At [facility], you and your health needs are our priority.  As part of our continuing mission to provide you with exceptional heart care, we have created designated Provider Care Teams.  These Care Teams include your primary Cardiologist (physician) and Advanced Practice Providers (APPs -  Physician Assistants and Nurse Practitioners) who all work together to provide you with the care you need, when you need it.  Your next appointment:   2 month(s)  The format for your next appointment:   In Person  Provider:   Jenne Campus, MD  Other Instructions   Echocardiogram An echocardiogram is a procedure that uses painless sound waves (ultrasound) to produce an image of the heart. Images from an echocardiogram can provide important information about:  Signs of coronary artery disease (CAD).  Aneurysm  detection. An aneurysm is a weak or damaged part of an artery wall that bulges out from the normal force of blood pumping through the body.  Heart size and shape. Changes in the size or shape of the heart can be associated with certain conditions, including heart failure, aneurysm, and CAD.  Heart muscle function.  Heart valve function.  Signs of a past heart attack.  Fluid buildup around the heart.  Thickening of the heart muscle.  A tumor or infectious growth around the heart valves. Tell a health care provider about:  Any allergies you have.  All medicines you are taking, including vitamins, herbs, eye drops, creams, and over-the-counter medicines.  Any blood disorders you have.  Any surgeries you have had.  Any medical conditions you have.  Whether you are pregnant or may be pregnant. What are the risks? Generally, this is a safe procedure. However, problems may occur, including:  Allergic reaction to dye (contrast) that may be used during the procedure. What happens before the procedure? No specific preparation is needed. You may eat and drink normally. What happens during the procedure?   An IV tube may be inserted into one of your veins.  You may receive contrast through this tube. A contrast is an injection that improves the quality of the pictures from your heart.  A gel will be applied to your chest.  A wand-like tool (transducer) will be moved over your chest. The gel will help to transmit the sound waves from the transducer.  The sound waves will harmlessly bounce off of your heart to allow the heart  images to be captured in real-time motion. The images will be recorded on a computer. The procedure may vary among health care providers and hospitals. What happens after the procedure?  You may return to your normal, everyday life, including diet, activities, and medicines, unless your health care provider tells you not to do that. Summary  An  echocardiogram is a procedure that uses painless sound waves (ultrasound) to produce an image of the heart.  Images from an echocardiogram can provide important information about the size and shape of your heart, heart muscle function, heart valve function, and fluid buildup around your heart.  You do not need to do anything to prepare before this procedure. You may eat and drink normally.  After the echocardiogram is completed, you may return to your normal, everyday life, unless your health care provider tells you not to do that. This information is not intended to replace advice given to you by your health care provider. Make sure you discuss any questions you have with your health care provider. Document Released: 01/30/2000 Document Revised: 05/25/2018 Document Reviewed: 03/06/2016 Elsevier Patient Education  2020 [employer].

## 2019-01-08 NOTE — Addendum Note (Signed)
Addended by: Ashok Norris on: 01/08/2019 03:27 PM   Modules accepted: Orders

## 2019-01-08 NOTE — Progress Notes (Signed)
Cardiology Office Note:    Date:  01/08/2019   ID:  Marissa Ponce, DOB 26-Jan-1941, MRN [id number]  PCP:  Ronita Hipps, MD  Cardiologist:  Jenne Campus, MD    Referring MD: Ronita Hipps, MD   Chief Complaint  Patient presents with  . Follow-up    History of Present Illness:    Marissa Ponce is a 78 y.o. female with history of mitral valve repair as well as tricuspid valve repair, paroxysmal atrial fibrillation, ascending arctic aneurysm measuring 44 mm of latest estimation, essential hypertension.  She comes today 2 months for follow-up complaint of having some shortness of breath as well as swelling of lower extremities but overall seems to be doing well.  Still very active.  Past Medical History:  Diagnosis Date  . Arthritis   . Complication of anesthesia   . Dysrhythmia    with caffine, "PVC's"  . Migraines     history of none recent  . PONV (postoperative nausea and vomiting)   . Shortness of breath dyspnea    with exertion    Past Surgical History:  Procedure Laterality Date  . ABDOMINAL HYSTERECTOMY    . ANTERIOR AND POSTERIOR REPAIR    . CARDIAC SURGERY     micuspid and tricuspid valve in 02/22/17  . CHOLECYSTECTOMY    . COLONOSCOPY    . ESOPHAGEAL DILATION    . HERNIA REPAIR Left    Ingunial  . IR THORACENTESIS ASP PLEURAL SPACE W/IMG GUIDE  04/11/2017  . ROTATOR CUFF REPAIR Right 2010ish  . TOTAL KNEE ARTHROPLASTY Left 05/16/2015   Procedure: LEFT TOTAL KNEE ARTHROPLASTY;  Surgeon: Netta Cedars, MD;  Location: [place];  Service: Orthopedics;  Laterality: Left;    Current Medications: Current Meds  Medication Sig  . apixaban (ELIQUIS) 5 MG TABS tablet TAKE 1 TABLET(5 MG) BY MOUTH TWICE DAILY  . b complex vitamins capsule Take 1 capsule by mouth daily.  . cholecalciferol (VITAMIN D) 1000 units tablet Take 1,000 Units by mouth daily.  Marland Kitchen diltiazem (CARDIZEM CD) 120 MG 24 hr capsule Take 1 capsule (120 mg total) by mouth daily.  . potassium  chloride (KLOR-CON) 10 MEQ tablet Take 2 tablets (20 mEq total) by mouth daily.  Marland Kitchen spironolactone (ALDACTONE) 25 MG tablet TAKE 1/2 TABLET BY MOUTH EVERY DAY  . torsemide (DEMADEX) 20 MG tablet Take 2 tablets (40 mg total) by mouth daily. Take an extra tab for weight >155     Allergies:   Heparin, Lovenox [enoxaparin sodium], Metoprolol, Percocet [oxycodone-acetaminophen], Prednisone, and Sulfonamide derivatives   Social History   Socioeconomic History  . Marital status: Married    Spouse name: Not on file  . Number of children: Not on file  . Years of education: Not on file  . Highest education level: Not on file  Occupational History  . Not on file  Social Needs  . Financial resource strain: Not on file  . Food insecurity    Worry: Not on file    Inability: Not on file  . Transportation needs    Medical: Not on file    Non-medical: Not on file  Tobacco Use  . Smoking status: Former Smoker    Years: 8.00  . Smokeless tobacco: Never Used  . Tobacco comment: quit age 67  Substance and Sexual Activity  . Alcohol use: No  . Drug use: No  . Sexual activity: Not on file  Lifestyle  . Physical activity    Days per  week: Not on file    Minutes per session: Not on file  . Stress: Not on file  Relationships  . Social Herbalist on phone: Not on file    Gets together: Not on file    Attends religious service: Not on file    Active member of club or organization: Not on file    Attends meetings of clubs or organizations: Not on file    Relationship status: Not on file  Other Topics Concern  . Not on file  Social History Narrative  . Not on file     Family History: The patient's family history includes Cancer in her father; Heart failure in her mother; Valvular heart disease in her mother. ROS:   Please see the history of present illness.    All 14 point review of systems negative except as described per history of present illness  EKGs/Labs/Other Studies  Reviewed:      Recent Labs: 04/11/2018: BUN 26; Creatinine, Ser 1.05; Magnesium 2.1; NT-Pro BNP 541; Potassium 4.5; Sodium 138  Recent Lipid Panel No results found for: CHOL, TRIG, HDL, CHOLHDL, VLDL, LDLCALC, LDLDIRECT  Physical Exam:    VS:  BP 110/68   Pulse 78   Ht 5\' 6"  (1.676 m)   Wt 179 lb 12.8 oz (81.6 kg)   SpO2 98%   BMI 29.02 kg/m     Wt Readings from Last 3 Encounters:  01/08/19 179 lb 12.8 oz (81.6 kg)  07/04/18 171 lb (77.6 kg)  04/11/18 174 lb 3.2 oz (79 kg)     GEN:  Well nourished, well developed in no acute distress HEENT: Normal NECK: No JVD; No carotid bruits LYMPHATICS: No lymphadenopathy CARDIAC: RRR, no murmurs, no rubs, no gallops RESPIRATORY:  Clear to auscultation without rales, wheezing or rhonchi  ABDOMEN: Soft, non-tender, non-distended MUSCULOSKELETAL:  No edema; No deformity  SKIN: Warm and dry LOWER EXTREMITIES: no swelling NEUROLOGIC:  Alert and oriented x 3 PSYCHIATRIC:  Normal affect   ASSESSMENT:    1. Ascending aortic aneurysm ([place])   2. Essential hypertension   3. Chronic diastolic congestive heart failure (HCC)   4. Paroxysmal atrial fibrillation (HCC)   5. Status post mitral valve repair   6. Status post tricuspid valve repair   7. Dyslipidemia   8. SOB (shortness of breath)    PLAN:    In order of problems listed above:  1. Standing octagon was 44 mm on echocardiogram from May.  We will repeat echocardiogram to look at the arteries again. 2. Essential hypertension blood pressure well controlled continue present management. 3. Chronic diastolic congestive heart failure I will do proBNP today as well as Chem-7 to see if there is any room to improve her medications. 4. Paroxysmal atrial fibrillation denies having any recent episodes.  Anticoagulated which I will continue Dyslipidemia continue present management Shortness of breath again echocardiogram will be done to assess left ventricle ejection fraction, will check  proBNP as well.   Medication Adjustments/Labs and Tests Ordered: Current medicines are reviewed at length with the patient today.  Concerns regarding medicines are outlined above.  No orders of the defined types were placed in this encounter.  Medication changes: No orders of the defined types were placed in this encounter.   Signed, Park Liter, MD, [facility] 01/08/2019 3:15 PM    [place]

## 2019-01-09 LAB — BASIC METABOLIC PANEL
BUN/Creatinine Ratio: 25 (ref 12–28)
BUN: 23 mg/dL (ref 8–27)
CO2: 22 mmol/L (ref 20–29)
Calcium: 8.8 mg/dL (ref 8.7–10.3)
Chloride: 99 mmol/L (ref 96–106)
Creatinine, Ser: 0.92 mg/dL (ref 0.57–1.00)
GFR calc Af Amer: 70 mL/min/{1.73_m2} (ref >59)
GFR calc non Af Amer: 61 mL/min/{1.73_m2} (ref >59)
Glucose: 95 mg/dL (ref 65–99)
Potassium: 4.1 mmol/L (ref 3.5–5.2)
Sodium: 141 mmol/L (ref 134–144)

## 2019-01-09 LAB — PRO B NATRIURETIC PEPTIDE: NT-Pro BNP: 1223 pg/mL — ABNORMAL HIGH (ref 0–738)

## 2019-01-15 ENCOUNTER — Telehealth: Payer: Self-pay | Admitting: Emergency Medicine

## 2019-01-15 DIAGNOSIS — I5032 Chronic diastolic (congestive) heart failure: Secondary | ICD-10-CM

## 2019-01-15 MED ORDER — TORSEMIDE 20 MG PO TABS: 40.0000 mg | ORAL_TABLET | Freq: Every day | ORAL | 1 refills | Status: DC

## 2019-01-15 NOTE — Telephone Encounter (Signed)
Called patient. Informed her of lab results. Informed her to increase torsemide to 40 mg in the morning and 20 mg in the evening and have labs redrawn in 1 week per Dr. Agustin Cree. Patient verbally understood. No further questions.

## 2019-01-16 DIAGNOSIS — R69 Illness, unspecified: Secondary | ICD-10-CM | POA: Diagnosis not present

## 2019-01-22 DIAGNOSIS — I5032 Chronic diastolic (congestive) heart failure: Secondary | ICD-10-CM | POA: Diagnosis not present

## 2019-01-23 DIAGNOSIS — R6883 Chills (without fever): Secondary | ICD-10-CM | POA: Diagnosis not present

## 2019-01-23 DIAGNOSIS — R519 Headache, unspecified: Secondary | ICD-10-CM | POA: Diagnosis not present

## 2019-01-23 DIAGNOSIS — R0981 Nasal congestion: Secondary | ICD-10-CM | POA: Diagnosis not present

## 2019-01-23 DIAGNOSIS — R05 Cough: Secondary | ICD-10-CM | POA: Diagnosis not present

## 2019-01-23 DIAGNOSIS — Z20828 Contact with and (suspected) exposure to other viral communicable diseases: Secondary | ICD-10-CM | POA: Diagnosis not present

## 2019-01-23 LAB — BASIC METABOLIC PANEL
BUN/Creatinine Ratio: 20 (ref 12–28)
BUN: 23 mg/dL (ref 8–27)
CO2: 25 mmol/L (ref 20–29)
Calcium: 9.5 mg/dL (ref 8.7–10.3)
Chloride: 96 mmol/L (ref 96–106)
Creatinine, Ser: 1.15 mg/dL — ABNORMAL HIGH (ref 0.57–1.00)
GFR calc Af Amer: 53 mL/min/{1.73_m2} — ABNORMAL LOW (ref >59)
GFR calc non Af Amer: 46 mL/min/{1.73_m2} — ABNORMAL LOW (ref >59)
Glucose: 140 mg/dL — ABNORMAL HIGH (ref 65–99)
Potassium: 4.3 mmol/L (ref 3.5–5.2)
Sodium: 137 mmol/L (ref 134–144)

## 2019-02-07 ENCOUNTER — Other Ambulatory Visit (HOSPITAL_COMMUNITY): Payer: Self-pay

## 2019-02-07 MED ORDER — APIXABAN 5 MG PO TABS: ORAL_TABLET | ORAL | 1 refills | Status: DC

## 2019-03-06 DIAGNOSIS — I4891 Unspecified atrial fibrillation: Secondary | ICD-10-CM

## 2019-03-06 DIAGNOSIS — K449 Diaphragmatic hernia without obstruction or gangrene: Secondary | ICD-10-CM

## 2019-03-06 DIAGNOSIS — M1990 Unspecified osteoarthritis, unspecified site: Secondary | ICD-10-CM | POA: Insufficient documentation

## 2019-03-06 HISTORY — DX: Diaphragmatic hernia without obstruction or gangrene: K44.9

## 2019-03-06 HISTORY — DX: Unspecified atrial fibrillation: I48.91

## 2019-03-07 ENCOUNTER — Ambulatory Visit (INDEPENDENT_AMBULATORY_CARE_PROVIDER_SITE_OTHER): Payer: Medicare HMO

## 2019-03-07 ENCOUNTER — Other Ambulatory Visit: Payer: Self-pay

## 2019-03-07 ENCOUNTER — Ambulatory Visit (INDEPENDENT_AMBULATORY_CARE_PROVIDER_SITE_OTHER): Payer: Medicare HMO | Admitting: Cardiology

## 2019-03-07 ENCOUNTER — Encounter: Payer: Self-pay | Admitting: Cardiology

## 2019-03-07 VITALS — BP 128/72 | HR 88 | Ht 66.0 in | Wt 177.8 lb

## 2019-03-07 DIAGNOSIS — I10 Essential (primary) hypertension: Secondary | ICD-10-CM | POA: Diagnosis not present

## 2019-03-07 DIAGNOSIS — I480 Paroxysmal atrial fibrillation: Secondary | ICD-10-CM

## 2019-03-07 DIAGNOSIS — R0602 Shortness of breath: Secondary | ICD-10-CM

## 2019-03-07 DIAGNOSIS — Z9889 Other specified postprocedural states: Secondary | ICD-10-CM

## 2019-03-07 DIAGNOSIS — I272 Pulmonary hypertension, unspecified: Secondary | ICD-10-CM

## 2019-03-07 DIAGNOSIS — I5032 Chronic diastolic (congestive) heart failure: Secondary | ICD-10-CM | POA: Diagnosis not present

## 2019-03-07 DIAGNOSIS — I712 Thoracic aortic aneurysm, without rupture: Secondary | ICD-10-CM | POA: Diagnosis not present

## 2019-03-07 NOTE — Progress Notes (Signed)
Complete echocardiogram has been performed.  Jimmy Ashleymarie Granderson RDCS, RVT 

## 2019-03-07 NOTE — Progress Notes (Signed)
Cardiology Office Note:    Date:  03/07/2019   ID:  Marissa Ponce, DOB February 24, 1940, MRN [id number]  PCP:  Ronita Hipps, MD  Cardiologist:  Jenne Campus, MD    Referring MD: Ronita Hipps, MD   Chief Complaint  Patient presents with  . Follow-up    2 MO FU     History of Present Illness:    Marissa Ponce is a 79 y.o. female past medical history significant for mitral valve repair as well as tricuspid valve repair.  Paroxysmal atrial fibrillation history of pulmonary emboli dyslipidemia.  Comes to my office for follow-up last time I seen her she was complained having some shortness of breath and fatigue but now she is doing well.  She did have echocardiogram done but I do not have results of this yet.  Past Medical History:  Diagnosis Date  . AF (atrial fibrillation) ([place]) 03/06/2019  . Arthritis   . Ascending aortic aneurysm ([place]) 04/11/2017   4.1 cm on CT scan done 04/09/17. Needs annual follow-up.  . Complication of anesthesia   . Diastolic CHF ([place]) [id number]  . Dyslipidemia 04/08/2017  . Dysrhythmia    with caffine, "PVC's"  . Essential hypertension   . Heparin induced thrombocytopenia ([place]) 03/11/2017  . Hiatal hernia 03/06/2019  . History of pulmonary embolism 04/08/2017  . History of stroke 04/08/2017  . Hypokalemia 04/12/2017  . Migraines     history of none recent  . Paroxysmal atrial fibrillation ([place]) 03/07/2017  . Persistent atrial fibrillation ([place]) 03/31/2016  . Pleural effusion 04/09/2017  . PONV (postoperative nausea and vomiting)   . Prediabetes 04/12/2017  . Pulmonary hypertension ([place]) 05/19/2016   Overview:  Risk factors include HFpEF, severe MR and atrial fibrillation.  PH Data: - TTE 04/13/16: LVEF 60-65%, LVH, LA moderately dilated. Mild aortic valve leaflet thickening with sclerosis. Moderate MR. Mild to moderate TR. Moderate PH with RVSP estimated 53 mmHg. IVC dilated.  . S/P Maze operation for atrial fibrillation 02/22/2017  . S/P MVR (mitral valve  repair) 02/22/2017   Overview:  02/22/17: 28 mm ring was chosen based on the size of the anterior mitral leaflet. 28 mm Tri-Ad ring for tricuspid valve.  . S/P total knee replacement using cement 05/16/2015  . S/P TVR (tricuspid valve repair) 02/22/2017  . Seizure ([place]) 02/24/2017  . Shortness of breath 03/31/2016  . Shortness of breath dyspnea    with exertion  . TIA (transient ischemic attack) 02/24/2017  . Visual blurriness 03/09/2017    Past Surgical History:  Procedure Laterality Date  . ABDOMINAL HYSTERECTOMY    . ANTERIOR AND POSTERIOR REPAIR    . CARDIAC SURGERY     micuspid and tricuspid valve in 02/22/17  . CHOLECYSTECTOMY    . COLONOSCOPY    . ESOPHAGEAL DILATION    . HERNIA REPAIR Left    Ingunial  . IR THORACENTESIS ASP PLEURAL SPACE W/IMG GUIDE  04/11/2017  . ROTATOR CUFF REPAIR Right 2010ish  . TOTAL KNEE ARTHROPLASTY Left 05/16/2015   Procedure: LEFT TOTAL KNEE ARTHROPLASTY;  Surgeon: Netta Cedars, MD;  Location: [place];  Service: Orthopedics;  Laterality: Left;    Current Medications: Current Meds  Medication Sig  . apixaban (ELIQUIS) 5 MG TABS tablet TAKE 1 TABLET(5 MG) BY MOUTH TWICE DAILY  . b complex vitamins capsule Take 1 capsule by mouth daily.  . cholecalciferol (VITAMIN D) 1000 units tablet Take 1,000 Units by mouth daily.  Marland Kitchen diltiazem (CARDIZEM CD) 120 MG 24  hr capsule Take 1 capsule (120 mg total) by mouth daily.  . potassium chloride (KLOR-CON) 10 MEQ tablet Take 2 tablets (20 mEq total) by mouth daily.  Marland Kitchen spironolactone (ALDACTONE) 25 MG tablet TAKE 1/2 TABLET BY MOUTH EVERY DAY  . torsemide (DEMADEX) 20 MG tablet Take 2 tablets (40 mg total) by mouth daily. Take 2 tablets in the morning and 1 tablet in the evening daily.     Allergies:   Heparin, Lovenox [enoxaparin sodium], Metoprolol, Percocet [oxycodone-acetaminophen], Prednisone, and Sulfonamide derivatives   Social History   Socioeconomic History  . Marital status: Married    Spouse name: Not on  file  . Number of children: Not on file  . Years of education: Not on file  . Highest education level: Not on file  Occupational History  . Not on file  Tobacco Use  . Smoking status: Former Smoker    Years: 8.00  . Smokeless tobacco: Never Used  . Tobacco comment: quit age 89  Substance and Sexual Activity  . Alcohol use: No  . Drug use: No  . Sexual activity: Not on file  Other Topics Concern  . Not on file  Social History Narrative  . Not on file   Social Determinants of Health   Financial Resource Strain:   . Difficulty of Paying Living Expenses: Not on file  Food Insecurity:   . Worried About Charity fundraiser in the Last Year: Not on file  . Ran Out of Food in the Last Year: Not on file  Transportation Needs:   . Lack of Transportation (Medical): Not on file  . Lack of Transportation (Non-Medical): Not on file  Physical Activity:   . Days of Exercise per Week: Not on file  . Minutes of Exercise per Session: Not on file  Stress:   . Feeling of Stress : Not on file  Social Connections:   . Frequency of Communication with Friends and Family: Not on file  . Frequency of Social Gatherings with Friends and Family: Not on file  . Attends Religious Services: Not on file  . Active Member of Clubs or Organizations: Not on file  . Attends Archivist Meetings: Not on file  . Marital Status: Not on file     Family History: The patient's family history includes Cancer in her father; Heart failure in her mother; Valvular heart disease in her mother. ROS:   Please see the history of present illness.    All 14 point review of systems negative except as described per history of present illness  EKGs/Labs/Other Studies Reviewed:      Recent Labs: 04/11/2018: Magnesium 2.1 01/08/2019: NT-Pro BNP 1,223 01/22/2019: BUN 23; Creatinine, Ser 1.15; Potassium 4.3; Sodium 137  Recent Lipid Panel No results found for: CHOL, TRIG, HDL, CHOLHDL, VLDL, LDLCALC,  LDLDIRECT  Physical Exam:    VS:  BP 128/72   Pulse 88   Ht 5\' 6"  (1.676 m)   Wt 177 lb 12.8 oz (80.6 kg)   SpO2 95%   BMI 28.70 kg/m     Wt Readings from Last 3 Encounters:  03/07/19 177 lb 12.8 oz (80.6 kg)  01/08/19 179 lb 12.8 oz (81.6 kg)  07/04/18 171 lb (77.6 kg)     GEN:  Well nourished, well developed in no acute distress HEENT: Normal NECK: No JVD; No carotid bruits LYMPHATICS: No lymphadenopathy CARDIAC: RRR, soft systolic murmur grade 1/6 best heard at the left border of the sternum, no rubs, no  gallops RESPIRATORY:  Clear to auscultation without rales, wheezing or rhonchi  ABDOMEN: Soft, non-tender, non-distended MUSCULOSKELETAL:  No edema; No deformity  SKIN: Warm and dry LOWER EXTREMITIES: no swelling NEUROLOGIC:  Alert and oriented x 3 PSYCHIATRIC:  Normal affect   ASSESSMENT:    1. Paroxysmal atrial fibrillation (HCC)   2. Pulmonary hypertension ([place])   3. S/P TVR (tricuspid valve repair)   4. S/P MVR (mitral valve repair)    PLAN:    In order of problems listed above:  1. Paroxysmal atrial fibrillation.  Doing well from that point review anticoagulated with Eliquis which I will continue.  His chads 2 Vascor equals 5. 2. Pulmonary hypertension denies having any shortness of breath she said that she is very active and have no difficulty being active.  Echocardiogram is pending to assess pulmonary pressure 3. Status post tricuspid and mitral valve repair.  Awaiting echocardiogram that being done today. 4. She has multiple questions about potentially discontinuation of some of her medication which I told her not at this stage.  We can await for echocardiogram and then decide.   Medication Adjustments/Labs and Tests Ordered: Current medicines are reviewed at length with the patient today.  Concerns regarding medicines are outlined above.  No orders of the defined types were placed in this encounter.  Medication changes: No orders of the defined types  were placed in this encounter.   Signed, Park Liter, MD, [facility] 03/07/2019 2:48 PM    [place]

## 2019-03-07 NOTE — Patient Instructions (Signed)

## 2019-03-08 ENCOUNTER — Other Ambulatory Visit: Payer: Self-pay | Admitting: Cardiology

## 2019-03-09 ENCOUNTER — Ambulatory Visit: Payer: Medicare HMO | Admitting: Cardiology

## 2019-03-19 ENCOUNTER — Telehealth: Payer: Self-pay | Admitting: Cardiology

## 2019-03-19 ENCOUNTER — Telehealth: Payer: Self-pay

## 2019-03-19 NOTE — Telephone Encounter (Signed)
This is Dr Agustin Cree pt in our [place] office.  Letter received via fax from [place] stating that they approved the pts Torsemide tier exception. Approval good until 02/15/20.  I will forward this note to the [place] office as FYI.

## 2019-03-19 NOTE — Telephone Encounter (Signed)
Pt calling today to request ABX for an upcoming dental filling. She states her CT surgeon told her she would need them from now on with dental work.  Sending to Dr. Agustin Cree and his RN for review.

## 2019-03-19 NOTE — Telephone Encounter (Signed)
  Patient is calling stating she has an upcoming dentist appointment for a fill in and would like a nurse to call her in regards to it.

## 2019-03-20 NOTE — Telephone Encounter (Signed)
Need rx for Amoxycillin 2gm po 30 min before dental appointment I do not see any allergy to penicillin

## 2019-03-21 MED ORDER — AMOXICILLIN 500 MG PO CAPS: 2000.0000 mg | ORAL_CAPSULE | Freq: Once | ORAL | 0 refills | Status: AC

## 2019-03-21 NOTE — Telephone Encounter (Signed)
Called patient. Informed her Dr. Agustin Cree recommends 2 grams of amoxicillin 30 min before dental appointment. Patient verbally understands. RX sent in.

## 2019-03-26 DIAGNOSIS — R69 Illness, unspecified: Secondary | ICD-10-CM | POA: Diagnosis not present

## 2019-04-07 ENCOUNTER — Other Ambulatory Visit: Payer: Self-pay | Admitting: Cardiology

## 2019-05-08 DIAGNOSIS — Z79899 Other long term (current) drug therapy: Secondary | ICD-10-CM | POA: Diagnosis not present

## 2019-05-08 DIAGNOSIS — Z9181 History of falling: Secondary | ICD-10-CM | POA: Diagnosis not present

## 2019-05-08 DIAGNOSIS — Z6829 Body mass index (BMI) 29.0-29.9, adult: Secondary | ICD-10-CM | POA: Diagnosis not present

## 2019-05-08 DIAGNOSIS — E7800 Pure hypercholesterolemia, unspecified: Secondary | ICD-10-CM | POA: Diagnosis not present

## 2019-05-08 DIAGNOSIS — K429 Umbilical hernia without obstruction or gangrene: Secondary | ICD-10-CM | POA: Diagnosis not present

## 2019-05-08 DIAGNOSIS — Z0000 Encounter for general adult medical examination without abnormal findings: Secondary | ICD-10-CM | POA: Diagnosis not present

## 2019-05-08 DIAGNOSIS — Z1331 Encounter for screening for depression: Secondary | ICD-10-CM | POA: Diagnosis not present

## 2019-05-14 ENCOUNTER — Other Ambulatory Visit: Payer: Self-pay | Admitting: Cardiology

## 2019-05-22 DIAGNOSIS — Z8673 Personal history of transient ischemic attack (TIA), and cerebral infarction without residual deficits: Secondary | ICD-10-CM | POA: Diagnosis not present

## 2019-05-22 DIAGNOSIS — R1033 Periumbilical pain: Secondary | ICD-10-CM | POA: Diagnosis not present

## 2019-05-22 DIAGNOSIS — I480 Paroxysmal atrial fibrillation: Secondary | ICD-10-CM | POA: Diagnosis not present

## 2019-05-22 DIAGNOSIS — K430 Incisional hernia with obstruction, without gangrene: Secondary | ICD-10-CM | POA: Diagnosis not present

## 2019-05-28 DIAGNOSIS — Z01812 Encounter for preprocedural laboratory examination: Secondary | ICD-10-CM | POA: Diagnosis not present

## 2019-05-28 DIAGNOSIS — K432 Incisional hernia without obstruction or gangrene: Secondary | ICD-10-CM | POA: Diagnosis not present

## 2019-05-28 DIAGNOSIS — Z20822 Contact with and (suspected) exposure to covid-19: Secondary | ICD-10-CM | POA: Diagnosis not present

## 2019-05-30 ENCOUNTER — Other Ambulatory Visit (HOSPITAL_COMMUNITY): Payer: Self-pay | Admitting: Internal Medicine

## 2019-06-04 DIAGNOSIS — Z8673 Personal history of transient ischemic attack (TIA), and cerebral infarction without residual deficits: Secondary | ICD-10-CM | POA: Diagnosis not present

## 2019-06-04 DIAGNOSIS — Z87891 Personal history of nicotine dependence: Secondary | ICD-10-CM | POA: Diagnosis not present

## 2019-06-04 DIAGNOSIS — Z7901 Long term (current) use of anticoagulants: Secondary | ICD-10-CM | POA: Diagnosis not present

## 2019-06-04 DIAGNOSIS — K430 Incisional hernia with obstruction, without gangrene: Secondary | ICD-10-CM | POA: Diagnosis not present

## 2019-06-04 DIAGNOSIS — I509 Heart failure, unspecified: Secondary | ICD-10-CM | POA: Diagnosis not present

## 2019-06-04 DIAGNOSIS — I110 Hypertensive heart disease with heart failure: Secondary | ICD-10-CM | POA: Diagnosis not present

## 2019-06-04 DIAGNOSIS — I480 Paroxysmal atrial fibrillation: Secondary | ICD-10-CM | POA: Diagnosis not present

## 2019-06-25 ENCOUNTER — Other Ambulatory Visit: Payer: Self-pay

## 2019-06-25 MED ORDER — POTASSIUM CHLORIDE CRYS ER 10 MEQ PO TBCR: 20.0000 meq | EXTENDED_RELEASE_TABLET | Freq: Every day | ORAL | 1 refills | Status: DC

## 2019-08-08 ENCOUNTER — Other Ambulatory Visit: Payer: Self-pay

## 2019-08-08 ENCOUNTER — Ambulatory Visit: Payer: Medicare HMO | Admitting: Cardiology

## 2019-08-08 ENCOUNTER — Encounter: Payer: Self-pay | Admitting: Cardiology

## 2019-08-08 VITALS — BP 138/70 | HR 99 | Ht 66.0 in | Wt 173.4 lb

## 2019-08-08 DIAGNOSIS — E785 Hyperlipidemia, unspecified: Secondary | ICD-10-CM

## 2019-08-08 DIAGNOSIS — I10 Essential (primary) hypertension: Secondary | ICD-10-CM

## 2019-08-08 DIAGNOSIS — Z8679 Personal history of other diseases of the circulatory system: Secondary | ICD-10-CM

## 2019-08-08 DIAGNOSIS — Z9889 Other specified postprocedural states: Secondary | ICD-10-CM | POA: Diagnosis not present

## 2019-08-08 DIAGNOSIS — I480 Paroxysmal atrial fibrillation: Secondary | ICD-10-CM | POA: Diagnosis not present

## 2019-08-08 NOTE — Progress Notes (Signed)
Cardiology Office Note:    Date:  08/08/2019   ID:  Marissa Ponce, DOB Apr 27, 1940, MRN [id number]  PCP:  Ronita Hipps, MD  Cardiologist:  Jenne Campus, MD    Referring MD: Ronita Hipps, MD   Chief Complaint  Patient presents with  . Follow-up    2 MO FU   M doing fine  History of Present Illness:    Marissa Ponce is a 79 y.o. female with past medical history significant for mitral valve repair as well as tricuspid valve repair done in 2019, ascending aortic aneurysm measuring 4.4 cm in 2021, dyslipidemia, essential hypertension comes today 2 months for follow-up.  Overall seems to be doing well.  Described to have some fatigue tiredness but otherwise fine.  She described 1 episode of palpitation happened about a month ago lasting for about 1 day.  She is anticoagulated which I will continue.  Past Medical History:  Diagnosis Date  . AF (atrial fibrillation) ([place]) 03/06/2019  . Arthritis   . Ascending aortic aneurysm ([place]) 04/11/2017   4.1 cm on CT scan done 04/09/17. Needs annual follow-up.  . Complication of anesthesia   . Diastolic CHF ([place]) 5/59/7416  . Dyslipidemia 04/08/2017  . Dysrhythmia    with caffine, "PVC's"  . Essential hypertension   . Heparin induced thrombocytopenia ([place]) 03/11/2017  . Hiatal hernia 03/06/2019  . History of pulmonary embolism 04/08/2017  . History of stroke 04/08/2017  . Hypokalemia 04/12/2017  . Migraines     history of none recent  . Paroxysmal atrial fibrillation ([place]) 03/07/2017  . Persistent atrial fibrillation ([place]) 03/31/2016  . Pleural effusion 04/09/2017  . PONV (postoperative nausea and vomiting)   . Prediabetes 04/12/2017  . Pulmonary hypertension ([place]) 05/19/2016   Overview:  Risk factors include HFpEF, severe MR and atrial fibrillation.  PH Data: - TTE 04/13/16: LVEF 60-65%, LVH, LA moderately dilated. Mild aortic valve leaflet thickening with sclerosis. Moderate MR. Mild to moderate TR. Moderate PH with RVSP estimated 53 mmHg.  IVC dilated.  . S/P Maze operation for atrial fibrillation 02/22/2017  . S/P MVR (mitral valve repair) 02/22/2017   Overview:  02/22/17: 28 mm ring was chosen based on the size of the anterior mitral leaflet. 28 mm Tri-Ad ring for tricuspid valve.  . S/P total knee replacement using cement 05/16/2015  . S/P TVR (tricuspid valve repair) 02/22/2017  . Seizure ([place]) 02/24/2017  . Shortness of breath 03/31/2016  . Shortness of breath dyspnea    with exertion  . TIA (transient ischemic attack) 02/24/2017  . Visual blurriness 03/09/2017    Past Surgical History:  Procedure Laterality Date  . ABDOMINAL HYSTERECTOMY    . ANTERIOR AND POSTERIOR REPAIR    . CARDIAC SURGERY     micuspid and tricuspid valve in 02/22/17  . CHOLECYSTECTOMY    . COLONOSCOPY    . ESOPHAGEAL DILATION    . HERNIA REPAIR Left    Ingunial  . IR THORACENTESIS ASP PLEURAL SPACE W/IMG GUIDE  04/11/2017  . ROTATOR CUFF REPAIR Right 2010ish  . TOTAL KNEE ARTHROPLASTY Left 05/16/2015   Procedure: LEFT TOTAL KNEE ARTHROPLASTY;  Surgeon: Netta Cedars, MD;  Location: [place];  Service: Orthopedics;  Laterality: Left;    Current Medications: Current Meds  Medication Sig  . apixaban (ELIQUIS) 5 MG TABS tablet TAKE 1 TABLET(5 MG) BY MOUTH TWICE DAILY  . b complex vitamins capsule Take 1 capsule by mouth daily.  . cholecalciferol (VITAMIN D) 1000 units tablet Take  1,000 Units by mouth daily.  Marland Kitchen diltiazem (CARDIZEM CD) 120 MG 24 hr capsule TAKE 1 CAPSULE BY MOUTH EVERY DAY  . potassium chloride (KLOR-CON M10) 10 MEQ tablet Take 2 tablets (20 mEq total) by mouth daily.  Marland Kitchen spironolactone (ALDACTONE) 25 MG tablet TAKE 1/2 TABLET BY MOUTH EVERY DAY  . torsemide (DEMADEX) 20 MG tablet TAKE 2 TABLETS IN THE MORNING AND 1 TABLET IN THE EVENING DAILY.     Allergies:   Heparin, Lovenox [enoxaparin sodium], Metoprolol, Percocet [oxycodone-acetaminophen], Prednisone, and Sulfonamide derivatives   Social History   Socioeconomic History  . Marital  status: Married    Spouse name: Not on file  . Number of children: Not on file  . Years of education: Not on file  . Highest education level: Not on file  Occupational History  . Not on file  Tobacco Use  . Smoking status: Former Smoker    Years: 8.00  . Smokeless tobacco: Never Used  . Tobacco comment: quit age 89  Vaping Use  . Vaping Use: Never used  Substance and Sexual Activity  . Alcohol use: No  . Drug use: No  . Sexual activity: Not on file  Other Topics Concern  . Not on file  Social History Narrative  . Not on file   Social Determinants of Health   Financial Resource Strain:   . Difficulty of Paying Living Expenses:   Food Insecurity:   . Worried About Charity fundraiser in the Last Year:   . Arboriculturist in the Last Year:   Transportation Needs:   . Film/video editor (Medical):   Marland Kitchen Lack of Transportation (Non-Medical):   Physical Activity:   . Days of Exercise per Week:   . Minutes of Exercise per Session:   Stress:   . Feeling of Stress :   Social Connections:   . Frequency of Communication with Friends and Family:   . Frequency of Social Gatherings with Friends and Family:   . Attends Religious Services:   . Active Member of Clubs or Organizations:   . Attends Archivist Meetings:   Marland Kitchen Marital Status:      Family History: The patient's family history includes Cancer in her father; Heart failure in her mother; Valvular heart disease in her mother. ROS:   Please see the history of present illness.    All 14 point review of systems negative except as described per history of present illness  EKGs/Labs/Other Studies Reviewed:      Recent Labs: 01/08/2019: NT-Pro BNP 1,223 01/22/2019: BUN 23; Creatinine, Ser 1.15; Potassium 4.3; Sodium 137  Recent Lipid Panel No results found for: CHOL, TRIG, HDL, CHOLHDL, VLDL, LDLCALC, LDLDIRECT  Physical Exam:    VS:  BP 138/70 (BP Location: Right Arm, Patient Position: Sitting, Cuff Size:  Normal)   Pulse 99   Ht 5\' 6"  (1.676 m)   Wt 173 lb 6.4 oz (78.7 kg)   SpO2 (!) 81%   BMI 27.99 kg/m     Wt Readings from Last 3 Encounters:  08/08/19 173 lb 6.4 oz (78.7 kg)  03/07/19 177 lb 12.8 oz (80.6 kg)  01/08/19 179 lb 12.8 oz (81.6 kg)     GEN:  Well nourished, well developed in no acute distress HEENT: Normal NECK: No JVD; No carotid bruits LYMPHATICS: No lymphadenopathy CARDIAC: RRR, no murmurs, no rubs, no gallops RESPIRATORY:  Clear to auscultation without rales, wheezing or rhonchi  ABDOMEN: Soft, non-tender, non-distended MUSCULOSKELETAL:  No edema; No deformity  SKIN: Warm and dry LOWER EXTREMITIES: no swelling NEUROLOGIC:  Alert and oriented x 3 PSYCHIATRIC:  Normal affect   ASSESSMENT:    1. Paroxysmal atrial fibrillation (HCC)   2. Essential hypertension   3. S/P MVR (mitral valve repair)   4. S/P Maze operation for atrial fibrillation   5. S/P TVR (tricuspid valve repair)   6. Dyslipidemia    PLAN:    In order of problems listed above:  1. Paroxysmal atrial fibrillation seems to maintain sinus rhythm right now.  Anticoagulated.  She had a question about potentially reducing the dose of Eliquis from 5 mg twice daily to 2.5 twice a day.  I show her the calculation that we do in the recommendation for the dosing of this medication I advised him to continue with 5 mg twice daily. 2. Status post mitral valve repair as well as tricuspid valve repair.  Echocardiogram reviewed with the patient.  Only mild mitral stenosis, trivial mitral regurgitation, ejection fraction 5055%.  Overall repair looks stable. 3. Ascending aortic aneurysm measuring 44 mm.  We will continue monitoring I spent a great of time explained to her she need to avoid isovolumetric exercise. 4. Dyslipidemia I did review her K PN which showed me her LDL being 171 and HDL 56.  We talked about potential initiation therapy however she told me that 1 she had cardiac catheterization done it did not  show any significant coronary artery disease.  Also she thinks that her LDL was being elevated because she had some bacon night before.  I told her that this is rather unlikely and I recommended to recheck fasting lipid profile and she is willing to do that.   Medication Adjustments/Labs and Tests Ordered: Current medicines are reviewed at length with the patient today.  Concerns regarding medicines are outlined above.  No orders of the defined types were placed in this encounter.  Medication changes: No orders of the defined types were placed in this encounter.   Signed, Marissa Liter, MD, [facility] 08/08/2019 4:20 PM    [facility]

## 2019-08-08 NOTE — Patient Instructions (Signed)
Medication Instructions:  Your physician recommends that you continue on your current medications as directed. Please refer to the Current Medication list given to you today.  *If you need a refill on your cardiac medications before your next appointment, please call your pharmacy*   Lab Work: Your physician recommends that you return for lab work : lipid  If you have labs (blood work) drawn today and your tests are completely normal, you will receive your results only by: Marland Kitchen MyChart Message (if you have MyChart) OR . A paper copy in the mail If you have any lab test that is abnormal or we need to change your treatment, we will call you to review the results.   Testing/Procedures: None.    Follow-Up: At [facility], you and your health needs are our priority.  As part of our continuing mission to provide you with exceptional heart care, we have created designated Provider Care Teams.  These Care Teams include your primary Cardiologist (physician) and Advanced Practice Providers (APPs -  Physician Assistants and Nurse Practitioners) who all work together to provide you with the care you need, when you need it.  We recommend signing up for the patient portal called "MyChart".  Sign up information is provided on this After Visit Summary.  MyChart is used to connect with patients for Virtual Visits (Telemedicine).  Patients are able to view lab/test results, encounter notes, upcoming appointments, etc.  Non-urgent messages can be sent to your provider as well.   To learn more about what you can do with MyChart, go to [email or web address].    Your next appointment:   6 month(s)  The format for your next appointment:   In Person  Provider:   Jenne Campus, MD   Other Instructions

## 2019-09-27 ENCOUNTER — Other Ambulatory Visit (HOSPITAL_COMMUNITY): Payer: Self-pay | Admitting: Internal Medicine

## 2019-10-21 ENCOUNTER — Other Ambulatory Visit: Payer: Self-pay | Admitting: Cardiology

## 2019-10-25 ENCOUNTER — Other Ambulatory Visit: Payer: Self-pay | Admitting: Cardiology

## 2019-10-25 IMAGING — CT CT CHEST W/O CM
2 of 4 series · 15 of 36 positions shown, 18 images · non-contrast
Comparison: Chest x-ray 04/09/2017, CT abdomen 05/12/2010

CLINICAL DATA: Shortness of breath

EXAM:
CT CHEST WITHOUT CONTRAST
TECHNIQUE: Multidetector CT imaging of the chest was performed following the
standard protocol without IV contrast.

[Series 4: thorax 2.0 · axial · 0.62mm/px · z∈[+1305,+1487]mm · 12 of 107 slices shown, 15 images]
[im 8/107  mediastinal]
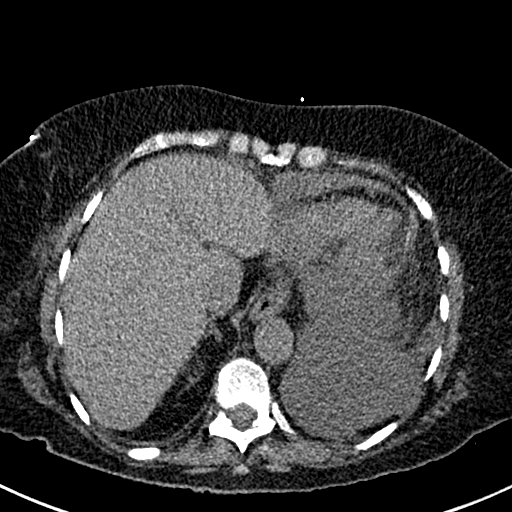
[im 8/107  lung]
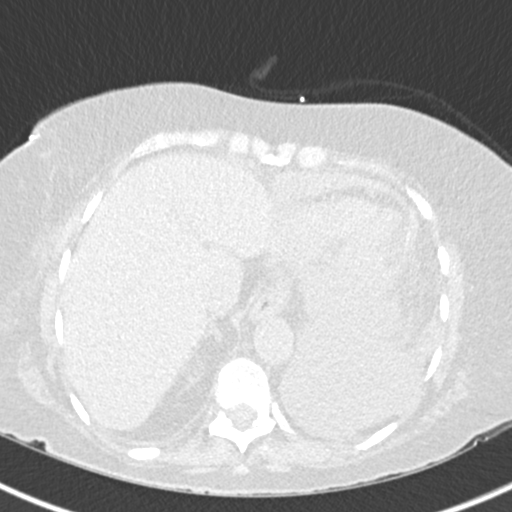
[im 16/107  lung]
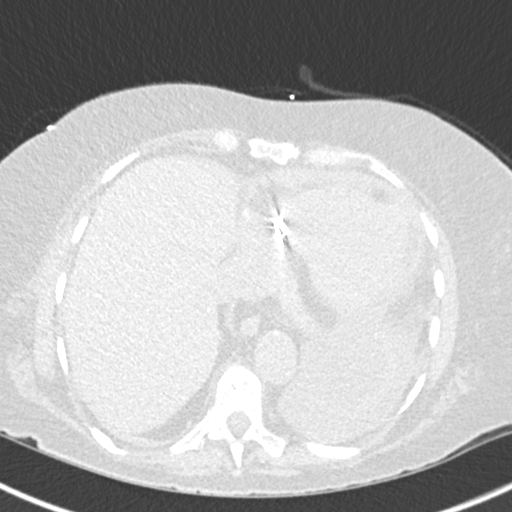
[im 23/107  lung]
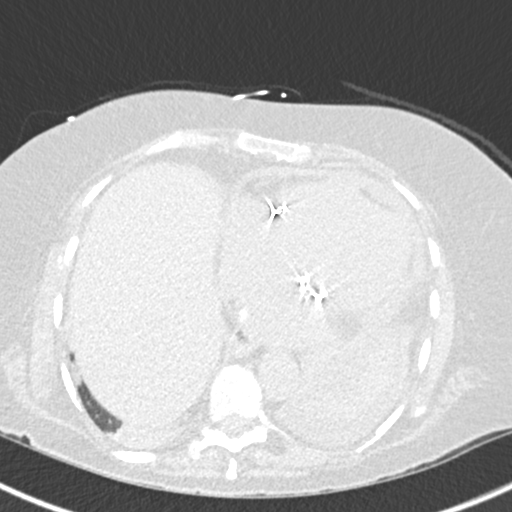
[im 31/107  lung]
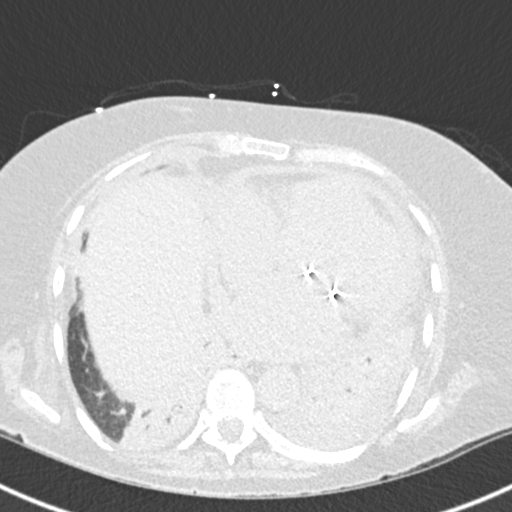
[im 38/107  mediastinal]
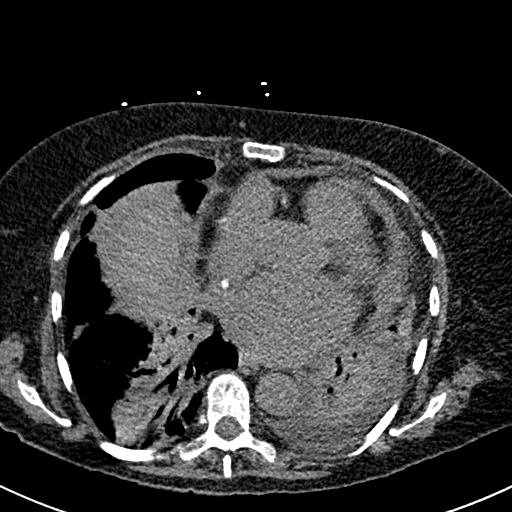
[im 38/107  lung]
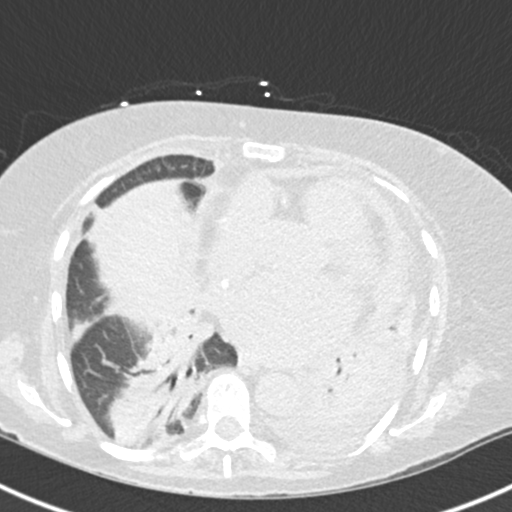
[im 46/107  lung]
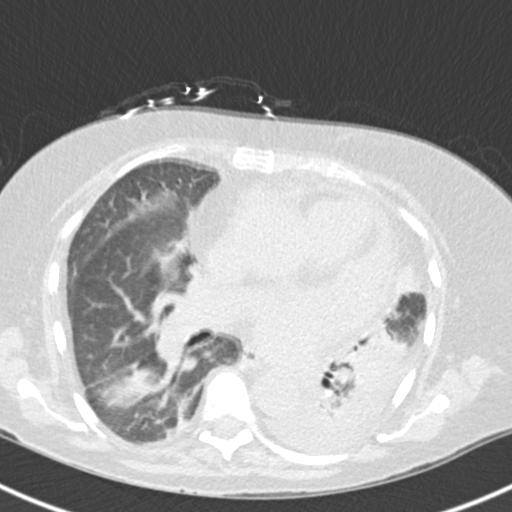
[im 61/107  lung]
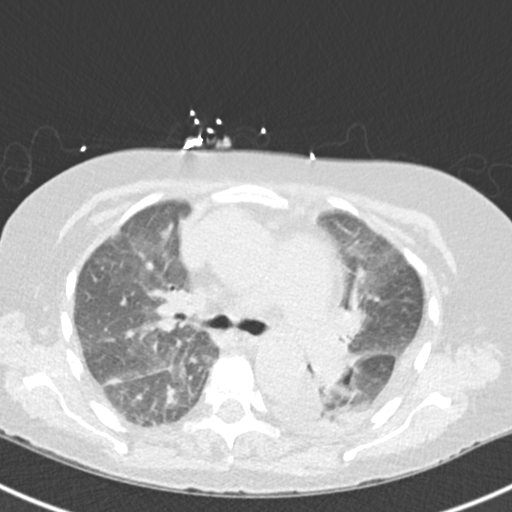
[im 69/107  lung]
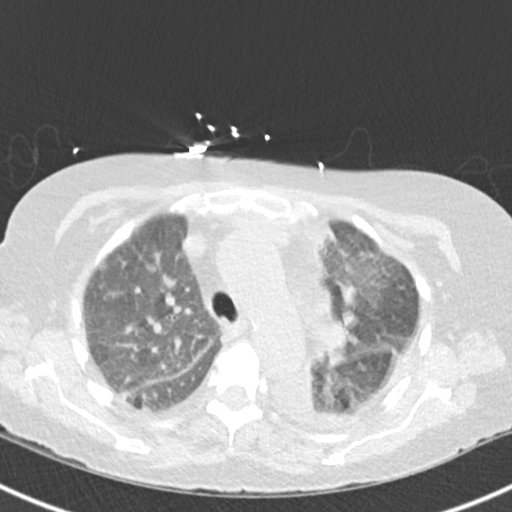
[im 76/107  mediastinal]
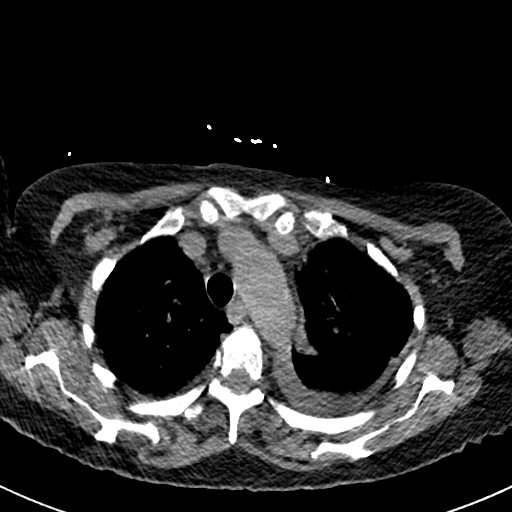
[im 76/107  lung]
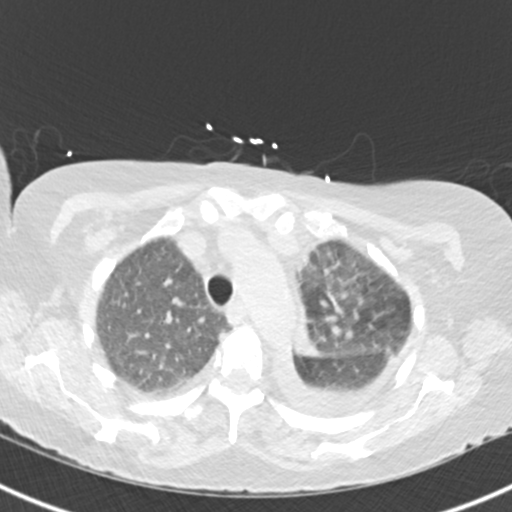
[im 84/107  lung]
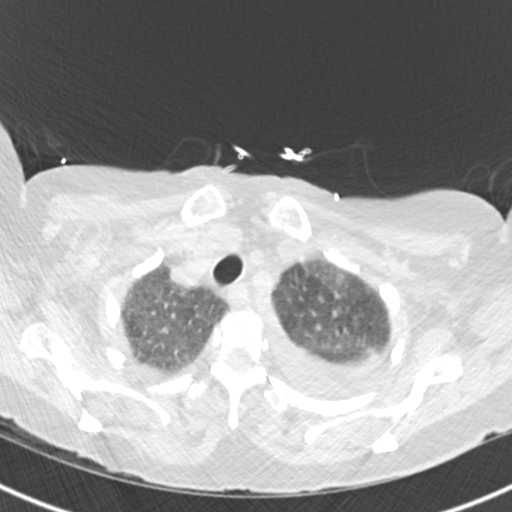
[im 91/107  lung]
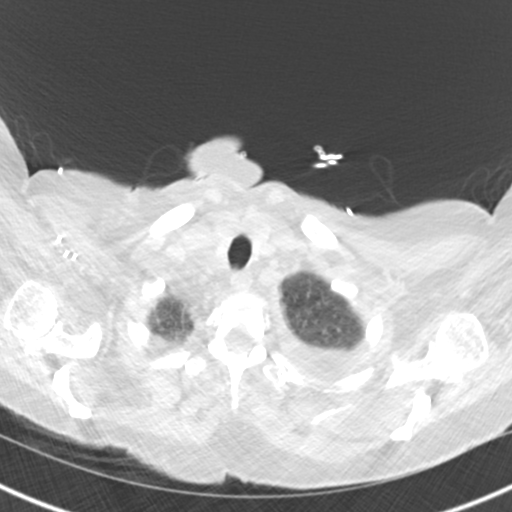
[im 99/107  lung]
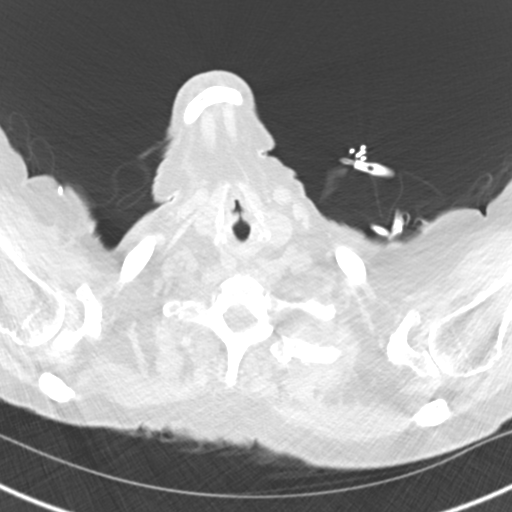

[Series 6: coronal · coronal · 0.44mm/px · 3 of 101 slices shown]
[im 21/101  lung]
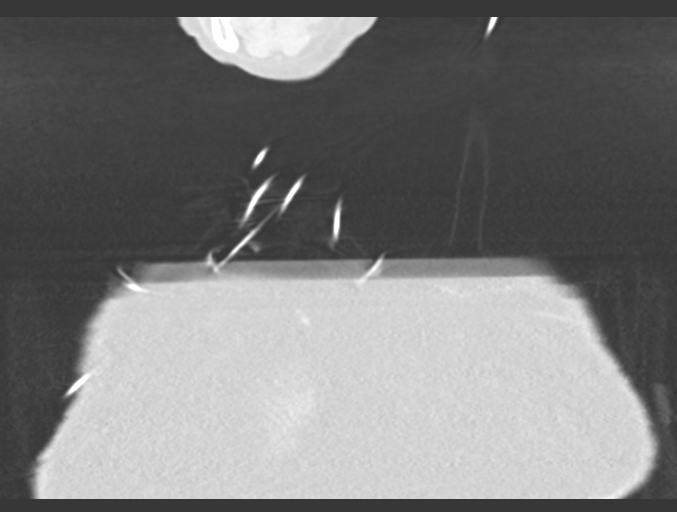
[im 41/101  lung]
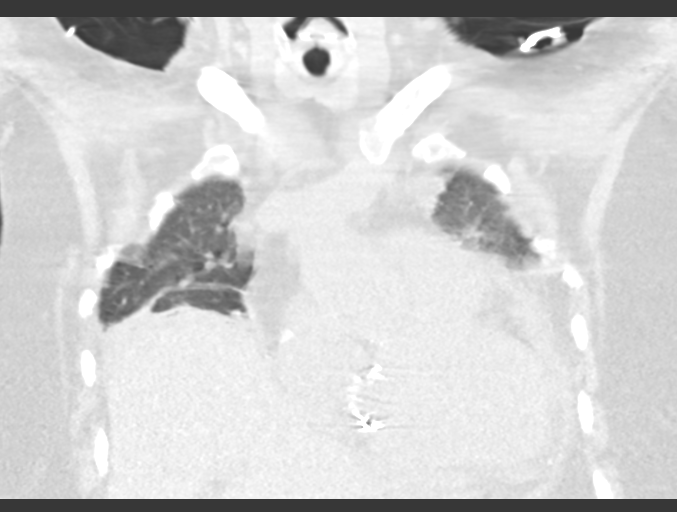
[im 61/101  lung]
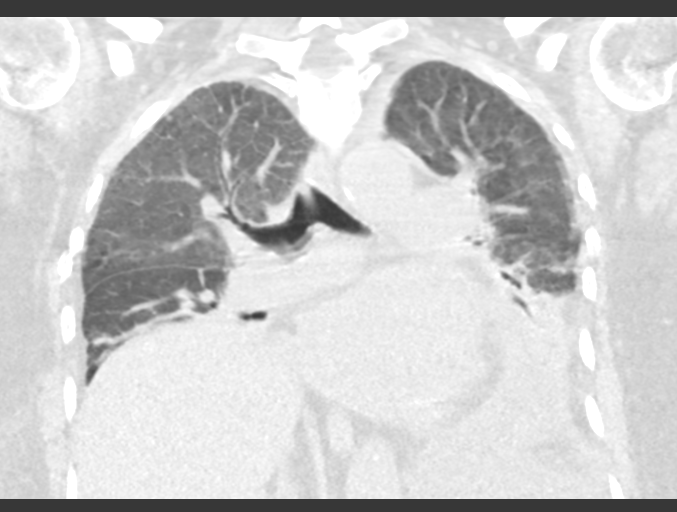

[15 of 36 positions shown; findings below may reference images not displayed]

FINDINGS: Cardiovascular: Limited evaluation without intravenous contrast.
Mild aneurysmal dilatation of the ascending aorta, measuring up to
4.1 cm. Mild aortic atherosclerosis. Enlarged pulmonary trunk
measuring 3.5 cm. Cardiomegaly with valve prosthesis. Coronary
artery calcification. Small pericardial effusion.

Mediastinum/Nodes: Midline trachea. No thyroid mass. 11 mm lymph
node anterior to the carina, with fatty hilus. Esophagus within
normal limits.

Lungs/Pleura: Moderate left pleural effusion, significant portion of
which appears sub pulmonic though this is only partially visualized.
Consolidation is present within the bilateral lower lobes. No
pneumothorax

Upper Abdomen: No acute abnormality.

Musculoskeletal: Degenerative changes. False appearance of sternal
fracture due to respiratory motion
IMPRESSION: 1. Moderate left pleural effusion, substantial portion of which
appears to be sub pulmonic but this is incompletely visualized.
2. Bilateral lower lobe consolidations may reflect atelectasis
pneumonia or aspiration
3. Cardiomegaly with small pericardial effusion
4. Ascending aortic aneurysm measuring up to 4.1 cm. Recommend
annual imaging followup by CTA or MRA. This recommendation follows
7919 ACCF/AHA/AATS/ACR/ASA/SCA/QRANATA/JHEMBOY/GLORIANE/DHILLON Guidelines for the
Diagnosis and Management of Patients with Thoracic Aortic Disease.
Circulation. 7919; 121: [id number]
5. Enlarged pulmonary trunk raises possibility of pulmonary artery
hypertension

Aortic Atherosclerosis (A2RW3-C2Y.Y).

## 2019-10-27 IMAGING — CR DG CHEST 2V
2 series · 2 of 2 positions shown · non-contrast
Comparison: Chest x-ray and chest CT scan April 09, 2017.

CLINICAL DATA: CHF, pneumonia, increasing shortness of breath.
History of valvular heart disease, atrial fibrillation, ascending
aortic aneurysm, bilateral pleural effusions, and former smoker.

EXAM:
CHEST  2 VIEW

[chest pa]
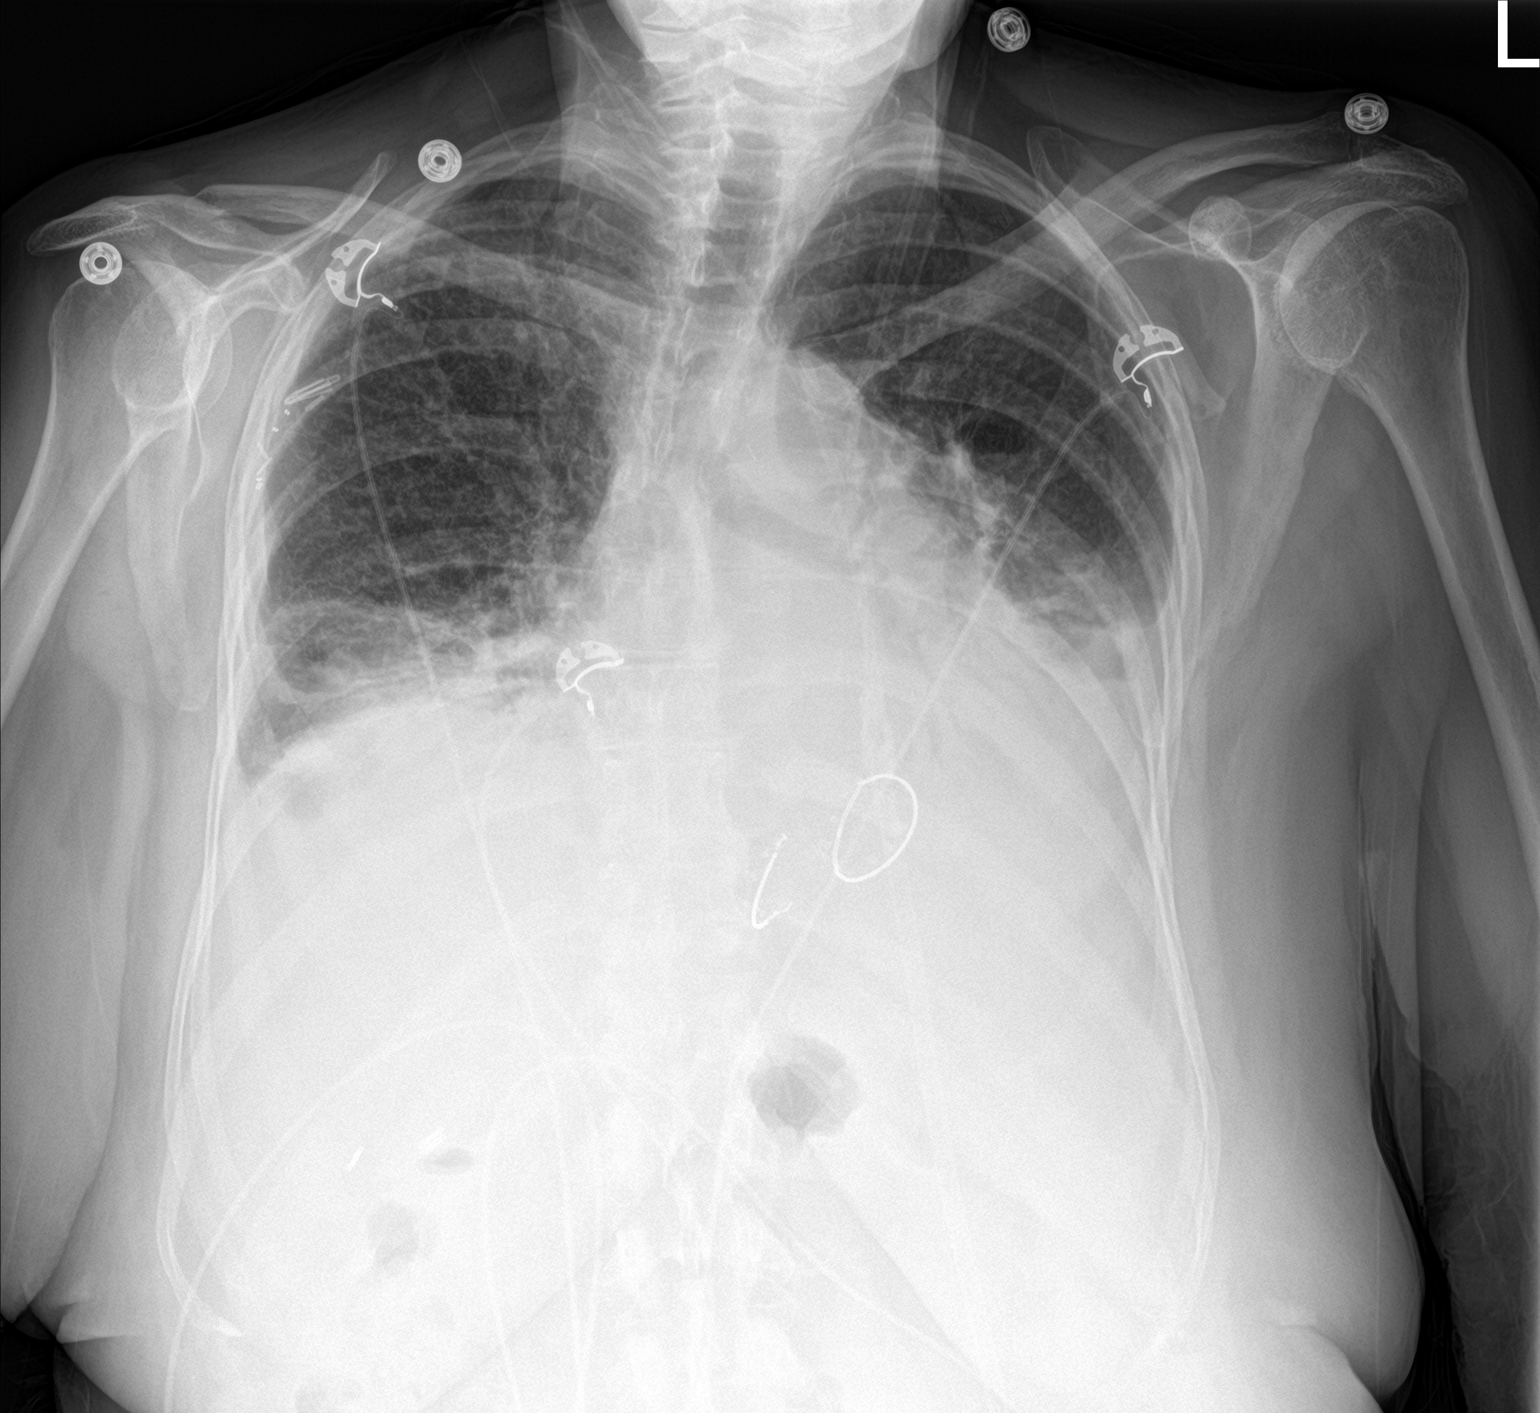

[chest lat]
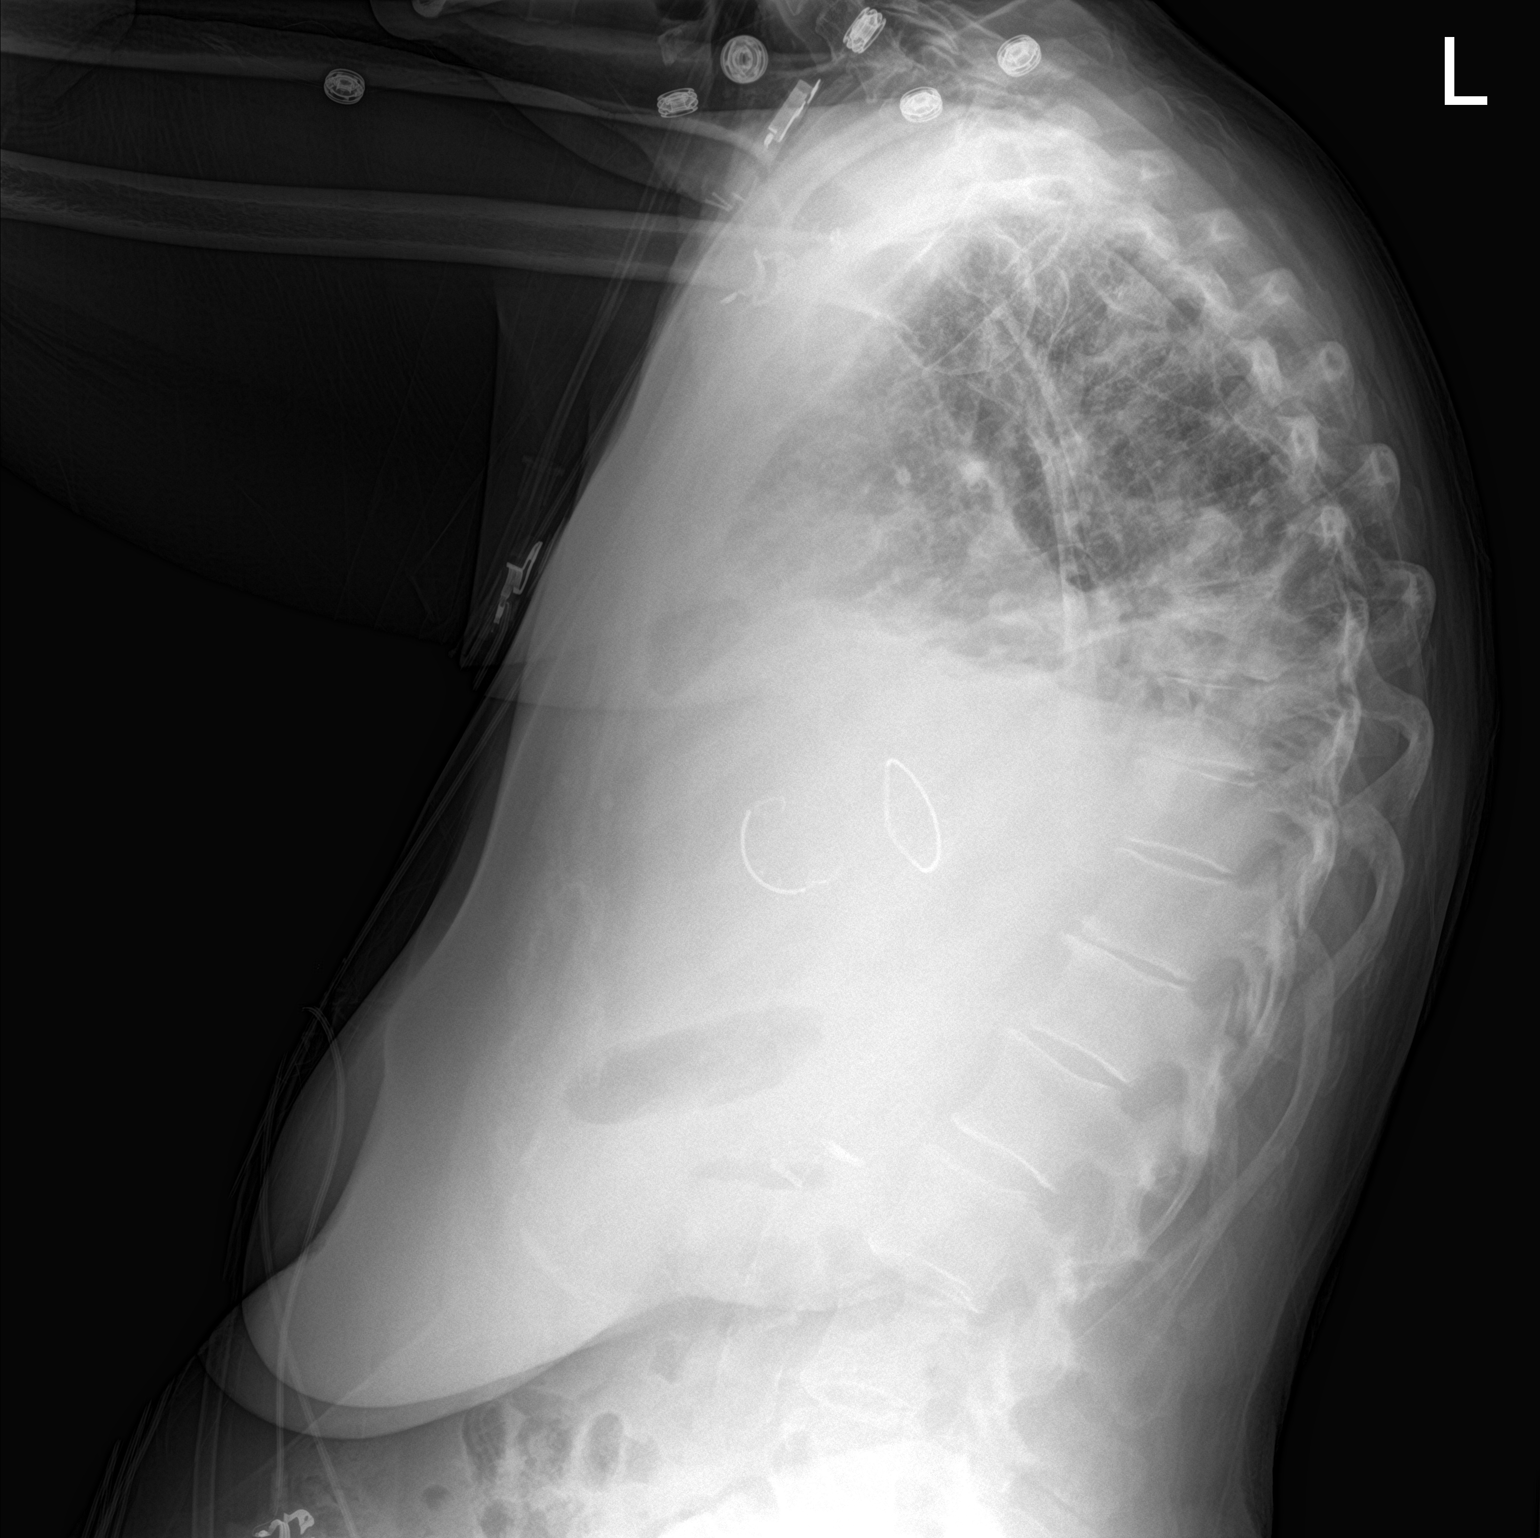

[2 of 2 positions shown; findings below may reference images not displayed]

FINDINGS: The lungs are hypoinflated. There is bibasilar atelectasis or
pneumonia with increase conspicuity of air bronchograms in the left
lower lobe. There are bilateral pleural effusions layering
posteriorly. The cardiac silhouette is enlarged. The patient has
undergone previous mitral and tricuspid valve repair-replacement. A
known ascending aortic aneurysm is not clearly evident on this plain
radiograph. There is multilevel degenerative disc disease of the
thoracic spine.
IMPRESSION: Stable appearance of the chest. Low-grade CHF with small bilateral
pleural effusions. Bibasilar atelectasis. Probable pneumonia in the
left lower lobe.

## 2019-10-29 IMAGING — DX DG CHEST 1V PORT
1 series · 1 of 1 positions shown · non-contrast
Comparison: Two days ago

CLINICAL DATA: CHF

EXAM:
PORTABLE CHEST 1 VIEW

[chest ap]
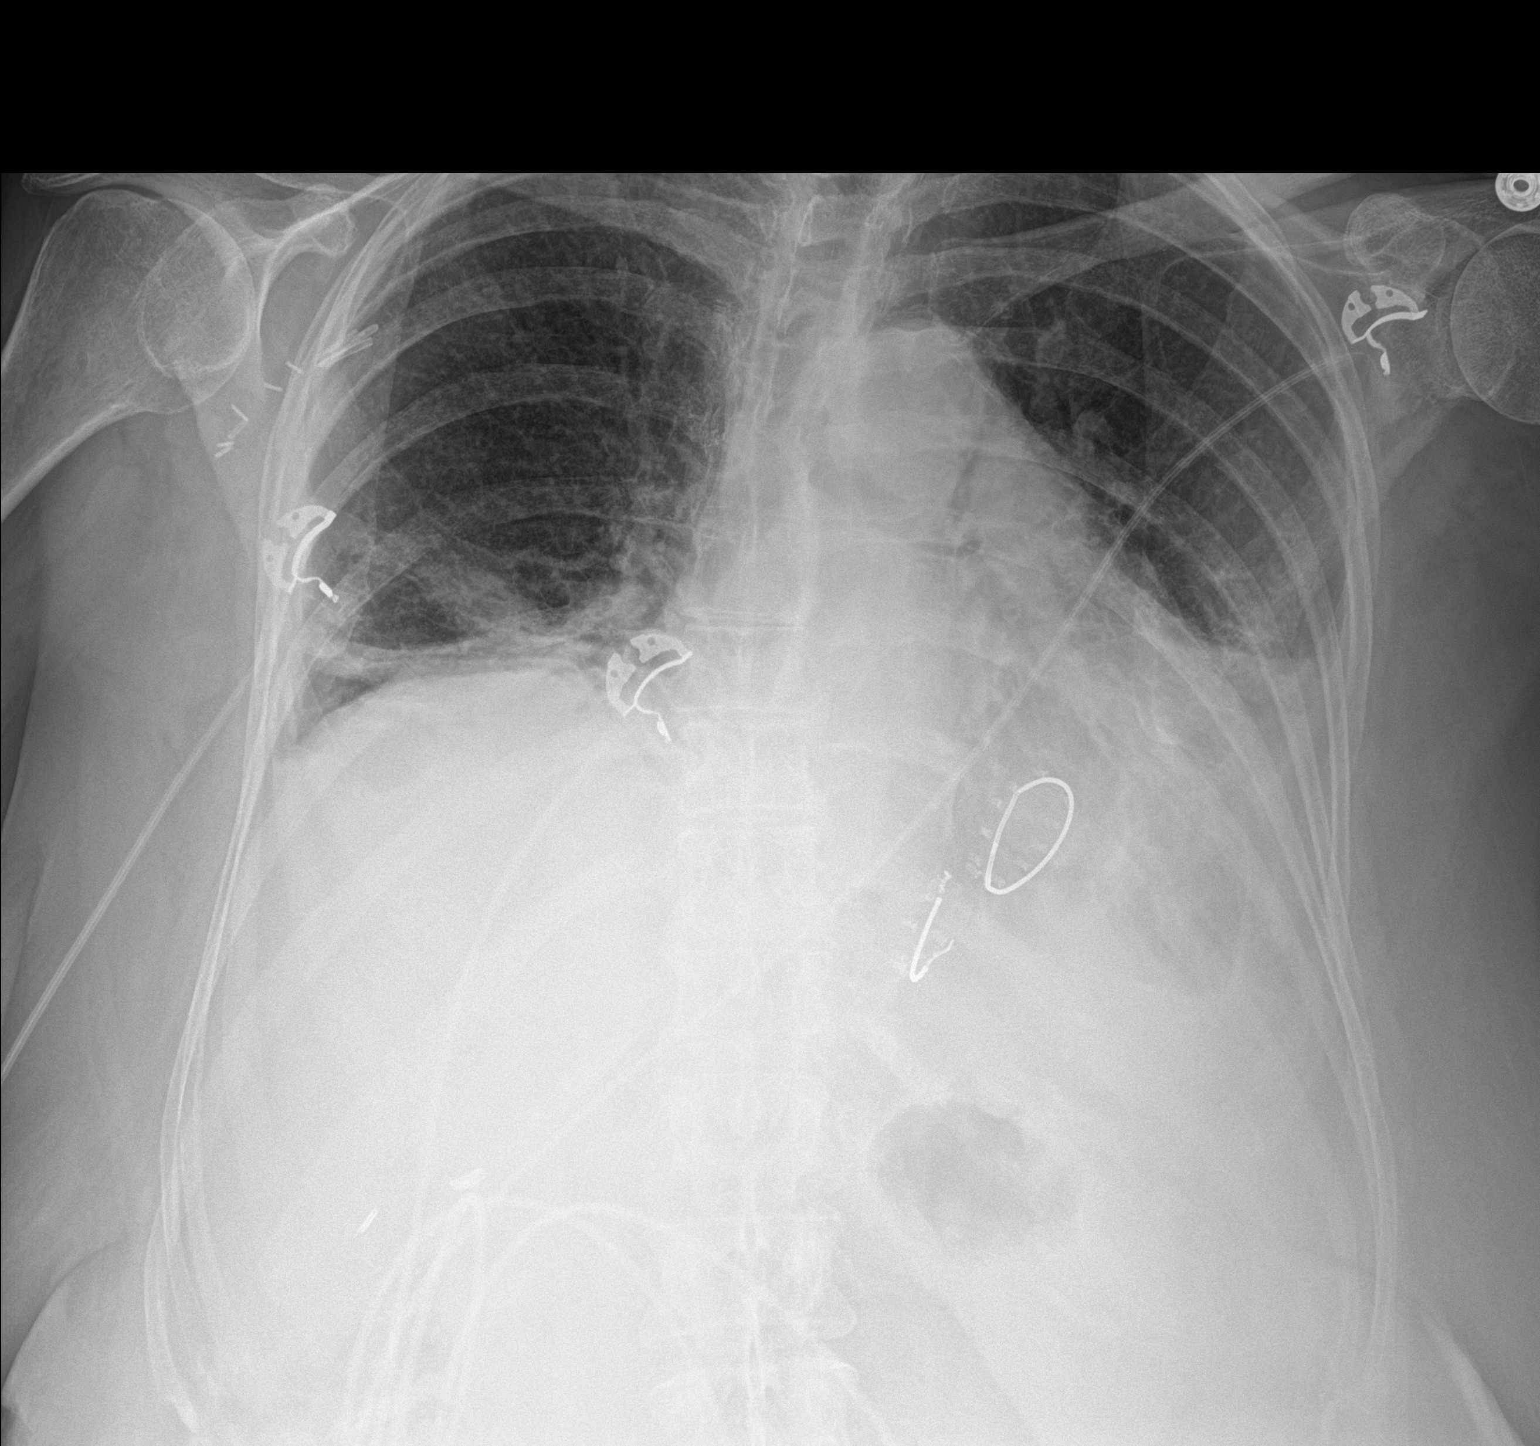

[1 of 1 positions shown; findings below may reference images not displayed]

FINDINGS: Cardiopericardial enlargement. Postoperative mitral and tricuspid
valves. Low volume chest with asymmetric right diaphragm elevation.
There is streaky bilateral lung opacity primarily atelectatic on
recent chest CT. Small left effusion.
IMPRESSION: 1. Stable compared to 2 days prior.
2. Bilateral atelectatic type opacity with small left effusion.

## 2020-01-30 DIAGNOSIS — I499 Cardiac arrhythmia, unspecified: Secondary | ICD-10-CM | POA: Insufficient documentation

## 2020-01-30 DIAGNOSIS — G43909 Migraine, unspecified, not intractable, without status migrainosus: Secondary | ICD-10-CM | POA: Insufficient documentation

## 2020-01-30 DIAGNOSIS — T8859XA Other complications of anesthesia, initial encounter: Secondary | ICD-10-CM | POA: Insufficient documentation

## 2020-02-01 ENCOUNTER — Ambulatory Visit: Payer: Medicare HMO | Admitting: Cardiology

## 2020-02-02 ENCOUNTER — Other Ambulatory Visit: Payer: Self-pay | Admitting: Cardiology

## 2020-02-04 NOTE — Telephone Encounter (Signed)
Rx refill sent to pharmacy. 

## 2020-02-25 DIAGNOSIS — Z96652 Presence of left artificial knee joint: Secondary | ICD-10-CM | POA: Diagnosis not present

## 2020-02-28 ENCOUNTER — Telehealth: Payer: Self-pay | Admitting: Cardiology

## 2020-02-28 NOTE — Telephone Encounter (Signed)
Called spoke to patient. She will come to [place] office tomorrow to pick up patient assistance application.

## 2020-02-28 NOTE — Telephone Encounter (Signed)
Pt c/o medication issue:  1. Name of Medication: ELIQUIS 5 MG TABS tablet  2. How are you currently taking this medication (dosage and times per day)? As directed  3. Are you having a reaction (difficulty breathing--STAT)? no  4. What is your medication issue? Patient is calling to inquire about getting patient assistance for this medication. Please advise.

## 2020-03-04 DIAGNOSIS — R051 Acute cough: Secondary | ICD-10-CM | POA: Diagnosis not present

## 2020-03-04 DIAGNOSIS — Z20828 Contact with and (suspected) exposure to other viral communicable diseases: Secondary | ICD-10-CM | POA: Diagnosis not present

## 2020-03-21 DIAGNOSIS — Z6830 Body mass index (BMI) 30.0-30.9, adult: Secondary | ICD-10-CM | POA: Diagnosis not present

## 2020-03-21 DIAGNOSIS — M79605 Pain in left leg: Secondary | ICD-10-CM | POA: Diagnosis not present

## 2020-04-02 ENCOUNTER — Telehealth: Payer: Self-pay | Admitting: Cardiology

## 2020-04-02 NOTE — Telephone Encounter (Signed)
Patient dropped off Marissa Ponce patient assistance forms at office on 04/02/20. Forms were placed in Dr. Wendy Poet box.

## 2020-04-11 ENCOUNTER — Telehealth: Payer: Self-pay | Admitting: Cardiology

## 2020-04-11 NOTE — Telephone Encounter (Signed)
    Pt is following up her pt assistance paperwork, she said it's been more than 10 days since she dropped off the paperwork and she called [employer] she was told [employer] not yet receive her pt assistance request, she would like to get a call back today

## 2020-04-11 NOTE — Telephone Encounter (Signed)
Patient notified [name] refax forms.

## 2020-04-16 ENCOUNTER — Telehealth: Payer: Self-pay

## 2020-04-16 NOTE — Telephone Encounter (Signed)
Patient [employer] approved from 04/16/2020 through 02/14/2021. [id number]#[id number]. Left a message for the patient to return my call.

## 2020-04-17 ENCOUNTER — Other Ambulatory Visit: Payer: Self-pay

## 2020-04-17 ENCOUNTER — Ambulatory Visit: Payer: Medicare HMO | Admitting: Cardiology

## 2020-04-17 ENCOUNTER — Encounter: Payer: Self-pay | Admitting: Cardiology

## 2020-04-17 VITALS — BP 128/78 | HR 80 | Ht 66.0 in | Wt 179.0 lb

## 2020-04-17 DIAGNOSIS — Z8679 Personal history of other diseases of the circulatory system: Secondary | ICD-10-CM | POA: Diagnosis not present

## 2020-04-17 DIAGNOSIS — I712 Thoracic aortic aneurysm, without rupture: Secondary | ICD-10-CM

## 2020-04-17 DIAGNOSIS — I7121 Aneurysm of the ascending aorta, without rupture: Secondary | ICD-10-CM

## 2020-04-17 DIAGNOSIS — I480 Paroxysmal atrial fibrillation: Secondary | ICD-10-CM | POA: Diagnosis not present

## 2020-04-17 DIAGNOSIS — Z9889 Other specified postprocedural states: Secondary | ICD-10-CM | POA: Diagnosis not present

## 2020-04-17 DIAGNOSIS — I5032 Chronic diastolic (congestive) heart failure: Secondary | ICD-10-CM | POA: Diagnosis not present

## 2020-04-17 NOTE — Patient Instructions (Signed)
Medication Instructions:  Your physician recommends that you continue on your current medications as directed. Please refer to the Current Medication list given to you today.  *If you need a refill on your cardiac medications before your next appointment, please call your pharmacy*   Lab Work: Your physician recommends that you return for lab work today: bmp  If you have labs (blood work) drawn today and your tests are completely normal, you will receive your results only by: Marland Kitchen MyChart Message (if you have MyChart) OR . A paper copy in the mail If you have any lab test that is abnormal or we need to change your treatment, we will call you to review the results.   Testing/Procedures: Your physician has requested that you have an echocardiogram. Echocardiography is a painless test that uses sound waves to create images of your heart. It provides your doctor with information about the size and shape of your heart and how well your heart's chambers and valves are working. This procedure takes approximately one hour. There are no restrictions for this procedure.     Follow-Up: At [facility], you and your health needs are our priority.  As part of our continuing mission to provide you with exceptional heart care, we have created designated Provider Care Teams.  These Care Teams include your primary Cardiologist (physician) and Advanced Practice Providers (APPs -  Physician Assistants and Nurse Practitioners) who all work together to provide you with the care you need, when you need it.  We recommend signing up for the patient portal called "MyChart".  Sign up information is provided on this After Visit Summary.  MyChart is used to connect with patients for Virtual Visits (Telemedicine).  Patients are able to view lab/test results, encounter notes, upcoming appointments, etc.  Non-urgent messages can be sent to your provider as well.   To learn more about what you can do with MyChart, go to  [email or web address].    Your next appointment:   6 month(s)  The format for your next appointment:   In Person  Provider:   Jenne Campus, MD   Other Instructions   Echocardiogram An echocardiogram is a test that uses sound waves (ultrasound) to produce images of the heart. Images from an echocardiogram can provide important information about:  Heart size and shape.  The size and thickness and movement of your heart's walls.  Heart muscle function and strength.  Heart valve function or if you have stenosis. Stenosis is when the heart valves are too narrow.  If blood is flowing backward through the heart valves (regurgitation).  A tumor or infectious growth around the heart valves.  Areas of heart muscle that are not working well because of poor blood flow or injury from a heart attack.  Aneurysm detection. An aneurysm is a weak or damaged part of an artery wall. The wall bulges out from the normal force of blood pumping through the body. Tell a health care provider about:  Any allergies you have.  All medicines you are taking, including vitamins, herbs, eye drops, creams, and over-the-counter medicines.  Any blood disorders you have.  Any surgeries you have had.  Any medical conditions you have.  Whether you are pregnant or may be pregnant. What are the risks? Generally, this is a safe test. However, problems may occur, including an allergic reaction to dye (contrast) that may be used during the test. What happens before the test? No specific preparation is needed. You may eat and drink normally.  What happens during the test?  You will take off your clothes from the waist up and put on a hospital gown.  Electrodes or electrocardiogram (ECG)patches may be placed on your chest. The electrodes or patches are then connected to a device that monitors your heart rate and rhythm.  You will lie down on a table for an ultrasound exam. A gel will be applied to  your chest to help sound waves pass through your skin.  A handheld device, called a transducer, will be pressed against your chest and moved over your heart. The transducer produces sound waves that travel to your heart and bounce back (or "echo" back) to the transducer. These sound waves will be captured in real-time and changed into images of your heart that can be viewed on a video monitor. The images will be recorded on a computer and reviewed by your health care provider.  You may be asked to change positions or hold your breath for a short time. This makes it easier to get different views or better views of your heart.  In some cases, you may receive contrast through an IV in one of your veins. This can improve the quality of the pictures from your heart. The procedure may vary among health care providers and hospitals.   What can I expect after the test? You may return to your normal, everyday life, including diet, activities, and medicines, unless your health care provider tells you not to do that. Follow these instructions at home:  It is up to you to get the results of your test. Ask your health care provider, or the department that is doing the test, when your results will be ready.  Keep all follow-up visits. This is important. Summary  An echocardiogram is a test that uses sound waves (ultrasound) to produce images of the heart.  Images from an echocardiogram can provide important information about the size and shape of your heart, heart muscle function, heart valve function, and other possible heart problems.  You do not need to do anything to prepare before this test. You may eat and drink normally.  After the echocardiogram is completed, you may return to your normal, everyday life, unless your health care provider tells you not to do that. This information is not intended to replace advice given to you by your health care provider. Make sure you discuss any questions you have  with your health care provider. Document Revised: 09/25/2019 Document Reviewed: 09/25/2019 Elsevier Patient Education  2021 Elsevier Inc.   

## 2020-04-17 NOTE — Addendum Note (Signed)
Addended by: Senaida Ores on: 04/17/2020 02:08 PM   Modules accepted: Orders

## 2020-04-17 NOTE — Progress Notes (Signed)
Cardiology Office Note:    Date:  04/17/2020   ID:  Marissa Ponce, DOB May 04, 1940, MRN [id number]  PCP:  Ronita Hipps, MD  Cardiologist:  Jenne Campus, MD    Referring MD: Ronita Hipps, MD   Chief Complaint  Patient presents with  . Follow-up  Doing well  History of Present Illness:    Marissa Ponce is a 80 y.o. female with past medical history significant for mitral valve repair as well as tricuspid valve repair done in 2019.  She also had ascending aneurysm measuring 4.4 cm based on echocardiogram from 2011, essential hypertension, dyslipidemia.  She comes today to my office for follow-up.  Overall she is doing well.  She denies have any chest pain tightness squeezing pressure burning chest.  About a month ago she was getting into the bath tripped and strike her left leg which swelled up after that overall getting better but still some problem with it.  Past Medical History:  Diagnosis Date  . AF (atrial fibrillation) ([place]) 03/06/2019  . Arthritis   . Ascending aortic aneurysm ([place]) 04/11/2017   4.1 cm on CT scan done 04/09/17. Needs annual follow-up.  . Complication of anesthesia   . Diastolic CHF ([place]) 3/78/5885  . Dyslipidemia 04/08/2017  . Dysrhythmia    with caffine, "PVC's"  . Essential hypertension   . Heparin induced thrombocytopenia ([place]) 03/11/2017  . Hiatal hernia 03/06/2019  . History of pulmonary embolism 04/08/2017  . History of stroke 04/08/2017  . Hypokalemia 04/12/2017  . Migraines     history of none recent  . Paroxysmal atrial fibrillation ([place]) 03/07/2017  . Persistent atrial fibrillation ([place]) 03/31/2016  . Pleural effusion 04/09/2017  . PONV (postoperative nausea and vomiting)   . Prediabetes 04/12/2017  . Pulmonary hypertension ([place]) 05/19/2016   Overview:  Risk factors include HFpEF, severe MR and atrial fibrillation.  PH Data: - TTE 04/13/16: LVEF 60-65%, LVH, LA moderately dilated. Mild aortic valve leaflet thickening with sclerosis. Moderate MR.  Mild to moderate TR. Moderate PH with RVSP estimated 53 mmHg. IVC dilated.  . S/P Maze operation for atrial fibrillation 02/22/2017  . S/P MVR (mitral valve repair) 02/22/2017   Overview:  02/22/17: 28 mm ring was chosen based on the size of the anterior mitral leaflet. 28 mm Tri-Ad ring for tricuspid valve.  . S/P total knee replacement using cement 05/16/2015  . S/P TVR (tricuspid valve repair) 02/22/2017  . Seizure ([place]) 02/24/2017  . Shortness of breath 03/31/2016  . Shortness of breath dyspnea    with exertion  . TIA (transient ischemic attack) 02/24/2017  . Visual blurriness 03/09/2017    Past Surgical History:  Procedure Laterality Date  . ABDOMINAL HYSTERECTOMY    . ANTERIOR AND POSTERIOR REPAIR    . CARDIAC SURGERY     micuspid and tricuspid valve in 02/22/17  . CHOLECYSTECTOMY    . COLONOSCOPY    . ESOPHAGEAL DILATION    . HERNIA REPAIR Left    [place]  . IR THORACENTESIS ASP PLEURAL SPACE W/IMG GUIDE  04/11/2017  . ROTATOR CUFF REPAIR Right 2010ish  . TOTAL KNEE ARTHROPLASTY Left 05/16/2015   Procedure: LEFT TOTAL KNEE ARTHROPLASTY;  Surgeon: Netta Cedars, MD;  Location: [facility];  Service: Orthopedics;  Laterality: Left;    Current Medications: Current Meds  Medication Sig  . b complex vitamins capsule Take 1 capsule by mouth daily.  . cholecalciferol (VITAMIN D) 1000 units tablet Take 1,000 Units by mouth daily.  Marland Kitchen diltiazem (  CARDIZEM CD) 120 MG 24 hr capsule TAKE 1 CAPSULE BY MOUTH EVERY DAY  . ELIQUIS 5 MG TABS tablet TAKE 1 TABLET(5 MG) BY MOUTH TWICE DAILY  . oxycodone (OXY-IR) 5 MG capsule Take 5 mg by mouth 2 (two) times daily as needed for moderate pain.  . potassium chloride (KLOR-CON M10) 10 MEQ tablet Take 2 tablets (20 mEq total) by mouth daily.  Marland Kitchen spironolactone (ALDACTONE) 25 MG tablet TAKE 1/2 TABLET BY MOUTH EVERY DAY  . torsemide (DEMADEX) 20 MG tablet TAKE 2 TABLETS IN THE MORNING AND 1 TABLET IN THE EVENING DAILY. (Patient taking differently: 20 mg.)  .  traMADol (ULTRAM) 50 MG tablet Take 50 mg by mouth 3 (three) times daily as needed for moderate pain.     Allergies:   Heparin, Lovenox [enoxaparin sodium], Metoprolol, Percocet [oxycodone-acetaminophen], Prednisone, and Sulfonamide derivatives   Social History   Socioeconomic History  . Marital status: Married    Spouse name: Not on file  . Number of children: Not on file  . Years of education: Not on file  . Highest education level: Not on file  Occupational History  . Not on file  Tobacco Use  . Smoking status: Former Smoker    Years: 8.00  . Smokeless tobacco: Never Used  . Tobacco comment: quit age 5  Vaping Use  . Vaping Use: Never used  Substance and Sexual Activity  . Alcohol use: No  . Drug use: No  . Sexual activity: Not on file  Other Topics Concern  . Not on file  Social History Narrative  . Not on file   Social Determinants of Health   Financial Resource Strain: Not on file  Food Insecurity: Not on file  Transportation Needs: Not on file  Physical Activity: Not on file  Stress: Not on file  Social Connections: Not on file     Family History: The patient's family history includes Cancer in her father; Heart failure in her mother; Valvular heart disease in her mother. ROS:   Please see the history of present illness.    All 14 point review of systems negative except as described per history of present illness  EKGs/Labs/Other Studies Reviewed:      Recent Labs: No results found for requested labs within last 8760 hours.  Recent Lipid Panel No results found for: CHOL, TRIG, HDL, CHOLHDL, VLDL, LDLCALC, LDLDIRECT  Physical Exam:    VS:  BP 128/78 (BP Location: Left Arm, Patient Position: Sitting)   Pulse 80   Ht 5\' 6"  (1.676 m)   Wt 179 lb (81.2 kg)   SpO2 97%   BMI 28.89 kg/m     Wt Readings from Last 3 Encounters:  04/17/20 179 lb (81.2 kg)  08/08/19 173 lb 6.4 oz (78.7 kg)  03/07/19 177 lb 12.8 oz (80.6 kg)     GEN:  Well nourished,  well developed in no acute distress HEENT: Normal NECK: No JVD; No carotid bruits LYMPHATICS: No lymphadenopathy CARDIAC: RRR, no murmurs, no rubs, no gallops RESPIRATORY:  Clear to auscultation without rales, wheezing or rhonchi  ABDOMEN: Soft, non-tender, non-distended MUSCULOSKELETAL:  No edema; No deformity  SKIN: Warm and dry LOWER EXTREMITIES: no swelling NEUROLOGIC:  Alert and oriented x 3 PSYCHIATRIC:  Normal affect   ASSESSMENT:    1. Paroxysmal atrial fibrillation (H[facility])   2. Ascending aortic aneurysm ([place])   3. Chronic diastolic congestive heart failure ([place])   4. S/P MVR (mitral valve repair)   5. S/P TVR (  tricuspid valve repair)   6. S/P Maze operation for atrial fibrillation    PLAN:    In order of problems listed above:  1. Paroxysmal atrial fibrillation EKG today show maintenance of sinus rhythm.  We will continue present management.  She did have maze procedure done during the time of her open heart surgery.  We will continue with Eliquis 5 mg twice daily. 2. Ascending arctic aneurysm she be scheduled to have echocardiogram to reassess the size of the aneurysm.  Last measurement year ago was 4.4 cm. 3. Status post mitral as well as tricuspid valve repair sounds good we will schedule her to have echocardiogram. 4. Dyslipidemia: Will make arrangements for fasting lipid profile to be checked. 5. We will check a Chem-7 today   Medication Adjustments/Labs and Tests Ordered: Current medicines are reviewed at length with the patient today.  Concerns regarding medicines are outlined above.  No orders of the defined types were placed in this encounter.  Medication changes: No orders of the defined types were placed in this encounter.   Signed, Park Liter, MD, [facility] 04/17/2020 2:02 PM    [place]

## 2020-04-18 LAB — BASIC METABOLIC PANEL
BUN/Creatinine Ratio: 18 (ref 12–28)
BUN: 19 mg/dL (ref 8–27)
CO2: 22 mmol/L (ref 20–29)
Calcium: 9.9 mg/dL (ref 8.7–10.3)
Chloride: 99 mmol/L (ref 96–106)
Creatinine, Ser: 1.07 mg/dL — ABNORMAL HIGH (ref 0.57–1.00)
Glucose: 97 mg/dL (ref 65–99)
Potassium: 4.5 mmol/L (ref 3.5–5.2)
Sodium: 141 mmol/L (ref 134–144)
eGFR: 53 mL/min/{1.73_m2} — ABNORMAL LOW (ref >59)

## 2020-04-28 ENCOUNTER — Other Ambulatory Visit: Payer: Self-pay | Admitting: Cardiology

## 2020-04-28 NOTE — Telephone Encounter (Signed)
Spironolactone approved and sent 

## 2020-05-09 DIAGNOSIS — E559 Vitamin D deficiency, unspecified: Secondary | ICD-10-CM | POA: Diagnosis not present

## 2020-05-09 DIAGNOSIS — Z9181 History of falling: Secondary | ICD-10-CM | POA: Diagnosis not present

## 2020-05-09 DIAGNOSIS — Z6829 Body mass index (BMI) 29.0-29.9, adult: Secondary | ICD-10-CM | POA: Diagnosis not present

## 2020-05-09 DIAGNOSIS — E7800 Pure hypercholesterolemia, unspecified: Secondary | ICD-10-CM | POA: Diagnosis not present

## 2020-05-09 DIAGNOSIS — I4891 Unspecified atrial fibrillation: Secondary | ICD-10-CM | POA: Diagnosis not present

## 2020-05-09 DIAGNOSIS — Z79899 Other long term (current) drug therapy: Secondary | ICD-10-CM | POA: Diagnosis not present

## 2020-05-09 DIAGNOSIS — Z1331 Encounter for screening for depression: Secondary | ICD-10-CM | POA: Diagnosis not present

## 2020-05-09 DIAGNOSIS — Z0000 Encounter for general adult medical examination without abnormal findings: Secondary | ICD-10-CM | POA: Diagnosis not present

## 2020-05-12 DIAGNOSIS — H25813 Combined forms of age-related cataract, bilateral: Secondary | ICD-10-CM | POA: Diagnosis not present

## 2020-05-19 ENCOUNTER — Other Ambulatory Visit: Payer: Medicare HMO

## 2020-06-11 ENCOUNTER — Ambulatory Visit (INDEPENDENT_AMBULATORY_CARE_PROVIDER_SITE_OTHER): Payer: Medicare HMO

## 2020-06-11 ENCOUNTER — Other Ambulatory Visit: Payer: Self-pay

## 2020-06-11 DIAGNOSIS — I480 Paroxysmal atrial fibrillation: Secondary | ICD-10-CM

## 2020-06-11 DIAGNOSIS — Z8679 Personal history of other diseases of the circulatory system: Secondary | ICD-10-CM

## 2020-06-11 DIAGNOSIS — Z9889 Other specified postprocedural states: Secondary | ICD-10-CM

## 2020-06-11 DIAGNOSIS — I5032 Chronic diastolic (congestive) heart failure: Secondary | ICD-10-CM

## 2020-06-11 DIAGNOSIS — I7121 Aneurysm of the ascending aorta, without rupture: Secondary | ICD-10-CM

## 2020-06-11 DIAGNOSIS — I712 Thoracic aortic aneurysm, without rupture: Secondary | ICD-10-CM | POA: Diagnosis not present

## 2020-06-11 NOTE — Progress Notes (Signed)
Complete echocardiogram performed.  Jimmy Tran Arzuaga RDCS, RVT  

## 2020-06-12 LAB — ECHOCARDIOGRAM COMPLETE
Area-P 1/2: 2.16 cm2
MV VTI: 1.13 cm2
S' Lateral: 2.6 cm

## 2020-06-30 ENCOUNTER — Telehealth: Payer: Self-pay | Admitting: Cardiology

## 2020-06-30 NOTE — Telephone Encounter (Signed)
Spoke to patient. Informed her the ekg order looks appropriately put in to me as far as I know. She is going to check again with billing.

## 2020-06-30 NOTE — Telephone Encounter (Signed)
    Pt transferred from billing, she said she paid $35 copay during her visit last March and she received a bill for $45 for the 12 lead EKG. She said she normally just pain $35 before and was never charge for EKG, she said it might have coded wrong.

## 2020-07-07 ENCOUNTER — Telehealth: Payer: Self-pay | Admitting: Cardiology

## 2020-07-07 NOTE — Telephone Encounter (Signed)
Spoke with patient about a bill that she received for $40. She stated that we coded her visit wrong. I explained to her that we do not handle billing in our office and offered to give her the number to our billing department. She stated she did not want the number because she will not be calling about this issue anymore. She was very rude during the conversation and not satisfied with the outcome. I will make the billing department aware of this.

## 2020-07-28 ENCOUNTER — Other Ambulatory Visit: Payer: Self-pay | Admitting: Cardiology

## 2020-07-28 NOTE — Telephone Encounter (Signed)
Klor-Con 10 meq # 180 tablets x 1 refill sent to pharmacy

## 2020-10-23 ENCOUNTER — Ambulatory Visit: Payer: Medicare HMO | Admitting: Cardiology

## 2020-10-23 ENCOUNTER — Encounter: Payer: Self-pay | Admitting: Cardiology

## 2020-10-23 ENCOUNTER — Other Ambulatory Visit: Payer: Self-pay

## 2020-10-23 VITALS — BP 124/60 | HR 88 | Ht 65.0 in | Wt 175.6 lb

## 2020-10-23 DIAGNOSIS — Z9889 Other specified postprocedural states: Secondary | ICD-10-CM

## 2020-10-23 DIAGNOSIS — I712 Thoracic aortic aneurysm, without rupture: Secondary | ICD-10-CM

## 2020-10-23 DIAGNOSIS — I480 Paroxysmal atrial fibrillation: Secondary | ICD-10-CM | POA: Diagnosis not present

## 2020-10-23 DIAGNOSIS — Z8679 Personal history of other diseases of the circulatory system: Secondary | ICD-10-CM | POA: Diagnosis not present

## 2020-10-23 DIAGNOSIS — I10 Essential (primary) hypertension: Secondary | ICD-10-CM

## 2020-10-23 DIAGNOSIS — I7121 Aneurysm of the ascending aorta, without rupture: Secondary | ICD-10-CM

## 2020-10-23 NOTE — Progress Notes (Signed)
Cardiology Office Note:    Date:  10/23/2020   ID:  Marissa Ponce, DOB [birth year], MRN [id number]  PCP:  Marissa Hipps, MD  Cardiologist:  Marissa Campus, MD    Referring MD: Marissa Hipps, MD   Chief Complaint  Patient presents with   Follow-up  Doing well  History of Present Illness:    Marissa Ponce is a 80 y.o. female with past medical history significant for mitral valve repair as well as tricuspid valve repair done in 2019, ascending aortic aneurysm measuring 4.4 cm last check was in March 2022 with echocardiogram, essential hypertension, dyslipidemia.  She comes today to my office for follow-up.  Overall she is doing very well denies have any chest pain tightness squeezing pressure burning chest no palpitations no dizziness no swelling of lower extremities.  She actually stopped taking Demadex and stopped taking potassium because there is no more swelling.  Past Medical History:  Diagnosis Date   AF (atrial fibrillation) ([place]) 03/06/2019   Arthritis    Ascending aortic aneurysm ([place]) 04/11/2017   4.1 cm on CT scan done 04/09/17. Needs annual follow-up.   Complication of anesthesia    Diastolic CHF ([place]) [id number]   Dyslipidemia 04/08/2017   Dysrhythmia    with caffine, "PVC's"   Essential hypertension    Heparin induced thrombocytopenia ([place]) 03/11/2017   Hiatal hernia 03/06/2019   History of pulmonary embolism 04/08/2017   History of stroke 04/08/2017   Hypokalemia 04/12/2017   Migraines     history of none recent   Paroxysmal atrial fibrillation (HCC) 03/07/2017   Persistent atrial fibrillation (HCC) 03/31/2016   Pleural effusion 04/09/2017   PONV (postoperative nausea and vomiting)    Prediabetes 04/12/2017   Pulmonary hypertension ([place]) 05/19/2016   Overview:  Risk factors include HFpEF, severe MR and atrial fibrillation.  PH Data: - TTE 04/13/16: LVEF 60-65%, LVH, LA moderately dilated. Mild aortic valve leaflet thickening with sclerosis. Moderate MR. Mild to moderate  TR. Moderate PH with RVSP estimated 53 mmHg. IVC dilated.   S/P Maze operation for atrial fibrillation 02/22/2017   S/P MVR (mitral valve repair) 02/22/2017   Overview:  02/22/17: 28 mm ring was chosen based on the size of the anterior mitral leaflet. 28 mm Tri-Ad ring for tricuspid valve.   S/P total knee replacement using cement 05/16/2015   S/P TVR (tricuspid valve repair) 02/22/2017   Seizure ([place]) 02/24/2017   Shortness of breath 03/31/2016   Shortness of breath dyspnea    with exertion   TIA (transient ischemic attack) 02/24/2017   Visual blurriness 03/09/2017    Past Surgical History:  Procedure Laterality Date   ABDOMINAL HYSTERECTOMY     ANTERIOR AND POSTERIOR REPAIR     CARDIAC SURGERY     micuspid and tricuspid valve in 02/22/17   CHOLECYSTECTOMY     COLONOSCOPY     ESOPHAGEAL DILATION     HERNIA REPAIR Left    Ingunial   IR THORACENTESIS ASP PLEURAL SPACE W/IMG GUIDE  04/11/2017   ROTATOR CUFF REPAIR Right 2010ish   TOTAL KNEE ARTHROPLASTY Left 05/16/2015   Procedure: LEFT TOTAL KNEE ARTHROPLASTY;  Surgeon: Netta Cedars, MD;  Location: [facility];  Service: Orthopedics;  Laterality: Left;    Current Medications: Current Meds  Medication Sig   b complex vitamins capsule Take 1 capsule by mouth daily. Unknown strength   cholecalciferol (VITAMIN D) 1000 units tablet Take 1,000 Units by mouth daily.   diltiazem (CARDIZEM CD) 120 MG 24  hr capsule TAKE 1 CAPSULE BY MOUTH EVERY DAY (Patient taking differently: Take 120 mg by mouth daily.)   ELIQUIS 5 MG TABS tablet TAKE 1 TABLET(5 MG) BY MOUTH TWICE DAILY (Patient taking differently: Take 5 mg by mouth 2 (two) times daily.)   KLOR-CON M10 10 MEQ tablet TAKE 2 TABLETS BY MOUTH DAILY (Patient taking differently: Take 20 mEq by mouth daily.)   oxycodone (OXY-IR) 5 MG capsule Take 5 mg by mouth 2 (two) times daily as needed for moderate pain.   spironolactone (ALDACTONE) 25 MG tablet TAKE 1/2 TABLET BY MOUTH EVERY DAY (Patient taking  differently: Take 12.5 mg by mouth daily.)   torsemide (DEMADEX) 20 MG tablet TAKE 2 TABLETS IN THE MORNING AND 1 TABLET IN THE EVENING DAILY. (Patient taking differently: Take 20 mg by mouth 2 (two) times daily.)   traMADol (ULTRAM) 50 MG tablet Take 50 mg by mouth 3 (three) times daily as needed for moderate pain.     Allergies:   Heparin, Lovenox [enoxaparin sodium], Metoprolol, Percocet [oxycodone-acetaminophen], Prednisone, and Sulfonamide derivatives   Social History   Socioeconomic History   Marital status: Married    Spouse name: Not on file   Number of children: Not on file   Years of education: Not on file   Highest education level: Not on file  Occupational History   Not on file  Tobacco Use   Smoking status: Former    Years: 8.00    Types: Cigarettes   Smokeless tobacco: Never   Tobacco comments:    quit age 43  Vaping Use   Vaping Use: Never used  Substance and Sexual Activity   Alcohol use: No   Drug use: No   Sexual activity: Not on file  Other Topics Concern   Not on file  Social History Narrative   Not on file   Social Determinants of Health   Financial Resource Strain: Not on file  Food Insecurity: Not on file  Transportation Needs: Not on file  Physical Activity: Not on file  Stress: Not on file  Social Connections: Not on file     Family History: The patient's family history includes Cancer in her father; Heart failure in her mother; Valvular heart disease in her mother. ROS:   Please see the history of present illness.    All 14 point review of systems negative except as described per history of present illness  EKGs/Labs/Other Studies Reviewed:      Recent Labs: 04/17/2020: BUN 19; Creatinine, Ser 1.07; Potassium 4.5; Sodium 141  Recent Lipid Panel No results found for: CHOL, TRIG, HDL, CHOLHDL, VLDL, LDLCALC, LDLDIRECT  Physical Exam:    VS:  BP 124/60 (BP Location: Right Arm, Patient Position: Sitting)   Pulse 88   Ht '5\' 5"'$  (1.651  m)   Wt 175 lb 9.6 oz (79.7 kg)   SpO2 97%   BMI 29.22 kg/m     Wt Readings from Last 3 Encounters:  10/23/20 175 lb 9.6 oz (79.7 kg)  04/17/20 179 lb (81.2 kg)  08/08/19 173 lb 6.4 oz (78.7 kg)     GEN:  Well nourished, well developed in no acute distress HEENT: Normal NECK: No JVD; No carotid bruits LYMPHATICS: No lymphadenopathy CARDIAC: RRR, no murmurs, no rubs, no gallops RESPIRATORY:  Clear to auscultation without rales, wheezing or rhonchi  ABDOMEN: Soft, non-tender, non-distended MUSCULOSKELETAL:  No edema; No deformity  SKIN: Warm and dry LOWER EXTREMITIES: no swelling NEUROLOGIC:  Alert and oriented x 3  PSYCHIATRIC:  Normal affect   ASSESSMENT:    1. Paroxysmal atrial fibrillation (HCC)   2. Essential hypertension   3. Ascending aortic aneurysm ([place])   4. S/P MVR (mitral valve repair)   5. S/P TVR (tricuspid valve repair)   6. S/P Maze operation for atrial fibrillation    PLAN:    In order of problems listed above:  Paroxysmal atrial fibrillation.  Seems to maintain sinus rhythm.  She is anticoagulated.  She actually approached me with idea today to discontinue her Eliquis since she does not have any more atrial fibrillation I told her it is inappropriate in my opinion she need to be taken that medication indefinitely. Essential hypertension blood pressure well controlled we will continue present medications. Ascending aortic aneurysm measuring 44 mm based on last echocardiogram from April of this year.  We will continue monitoring. Status post mitral valve repair and tricuspid valve repair.  Echocardiogram showed good function of the valve. Status post Maze procedure.  Noted   Medication Adjustments/Labs and Tests Ordered: Current medicines are reviewed at length with the patient today.  Concerns regarding medicines are outlined above.  No orders of the defined types were placed in this encounter.  Medication changes: No orders of the defined types were  placed in this encounter.   Signed, Park Liter, MD, [facility] 10/23/2020 [place]

## 2020-10-23 NOTE — Patient Instructions (Signed)

## 2020-11-01 ENCOUNTER — Other Ambulatory Visit: Payer: Self-pay | Admitting: Cardiology

## 2020-12-01 DIAGNOSIS — Z23 Encounter for immunization: Secondary | ICD-10-CM | POA: Diagnosis not present

## 2021-01-05 ENCOUNTER — Other Ambulatory Visit (HOSPITAL_COMMUNITY): Payer: Self-pay | Admitting: Internal Medicine

## 2021-02-23 ENCOUNTER — Telehealth: Payer: Self-pay | Admitting: Cardiology

## 2021-02-23 NOTE — Telephone Encounter (Signed)
Patient called wanting to know if her paperwork for Eliquis was faxed over, because she hasn't heard anything.

## 2021-02-24 NOTE — Telephone Encounter (Signed)
Spoke with Lori, [place], she advise me application received and completed, still being processed however Marissa Ponce application has been expedited. Patient has been notified and confirms she has plenty  Eliquis.

## 2021-04-28 ENCOUNTER — Ambulatory Visit: Payer: Medicare HMO | Admitting: Cardiology

## 2021-05-11 DIAGNOSIS — E559 Vitamin D deficiency, unspecified: Secondary | ICD-10-CM | POA: Diagnosis not present

## 2021-05-11 DIAGNOSIS — I4891 Unspecified atrial fibrillation: Secondary | ICD-10-CM | POA: Diagnosis not present

## 2021-05-11 DIAGNOSIS — E7800 Pure hypercholesterolemia, unspecified: Secondary | ICD-10-CM | POA: Diagnosis not present

## 2021-05-11 DIAGNOSIS — Z79899 Other long term (current) drug therapy: Secondary | ICD-10-CM | POA: Diagnosis not present

## 2021-05-11 DIAGNOSIS — Z6829 Body mass index (BMI) 29.0-29.9, adult: Secondary | ICD-10-CM | POA: Diagnosis not present

## 2021-05-11 DIAGNOSIS — Z9181 History of falling: Secondary | ICD-10-CM | POA: Diagnosis not present

## 2021-05-11 DIAGNOSIS — Z0000 Encounter for general adult medical examination without abnormal findings: Secondary | ICD-10-CM | POA: Diagnosis not present

## 2021-05-11 DIAGNOSIS — Z1331 Encounter for screening for depression: Secondary | ICD-10-CM | POA: Diagnosis not present

## 2021-06-26 ENCOUNTER — Ambulatory Visit: Payer: Medicare HMO | Admitting: Cardiology

## 2021-06-26 ENCOUNTER — Encounter: Payer: Self-pay | Admitting: Cardiology

## 2021-06-26 VITALS — BP 110/68 | HR 88 | Ht 66.0 in | Wt 175.6 lb

## 2021-06-26 DIAGNOSIS — I10 Essential (primary) hypertension: Secondary | ICD-10-CM

## 2021-06-26 DIAGNOSIS — Z9889 Other specified postprocedural states: Secondary | ICD-10-CM | POA: Diagnosis not present

## 2021-06-26 DIAGNOSIS — E785 Hyperlipidemia, unspecified: Secondary | ICD-10-CM | POA: Diagnosis not present

## 2021-06-26 DIAGNOSIS — I480 Paroxysmal atrial fibrillation: Secondary | ICD-10-CM | POA: Diagnosis not present

## 2021-06-26 DIAGNOSIS — I5032 Chronic diastolic (congestive) heart failure: Secondary | ICD-10-CM | POA: Diagnosis not present

## 2021-06-26 NOTE — Progress Notes (Signed)
?Cardiology Office Note:   ? ?Date:  06/26/2021  ? ?ID:  Marissa Ponce, DOB 1941-02-07, MRN [id number] ? ?PCP:  Ronita Hipps, MD  ?Cardiologist:  Jenne Campus, MD   ? ?Referring MD: Ronita Hipps, MD  ? ?Chief Complaint  ?Patient presents with  ? Follow-up  ?Doing well ? ?History of Present Illness:   ? ?Marissa Ponce is a 81 y.o. female with past medical history significant for mitral valve repair as well as tricuspid valve repair done in 2019, ascending arctic aneurysm measuring 44 to 45 mm last time check in March 2022 with echocardiogram, essential hypertension, dyslipidemia.  She is coming today to my office for follow-up overall she seems to be doing well.  She denies have any chest pain tightness squeezing pressure burning chest.  Biggest concern she had today is about the fact that she does have some varicose vein. ? ?Past Medical History:  ?Diagnosis Date  ? AF (atrial fibrillation) ([place]) 03/06/2019  ? Arthritis   ? Ascending aortic aneurysm ([place]) 04/11/2017  ? 4.1 cm on CT scan done 04/09/17. Needs annual follow-up.  ? Complication of anesthesia   ? Diastolic CHF ([place]) 9/93/5701  ? Dyslipidemia 04/08/2017  ? Dysrhythmia   ? with caffine, "PVC's"  ? Essential hypertension   ? Heparin induced thrombocytopenia ([place]) 03/11/2017  ? Hiatal hernia 03/06/2019  ? History of pulmonary embolism 04/08/2017  ? History of stroke 04/08/2017  ? Hypokalemia 04/12/2017  ? Migraines   ?  history of none recent  ? Paroxysmal atrial fibrillation ([place]) 03/07/2017  ? Persistent atrial fibrillation ([place]) 03/31/2016  ? Pleural effusion 04/09/2017  ? PONV (postoperative nausea and vomiting)   ? Prediabetes 04/12/2017  ? Pulmonary hypertension ([place]) 05/19/2016  ? Overview:  Risk factors include HFpEF, severe MR and atrial fibrillation.  PH Data: - TTE 04/13/16: LVEF 60-65%, LVH, LA moderately dilated. Mild aortic valve leaflet thickening with sclerosis. Moderate MR. Mild to moderate TR. Moderate PH with RVSP estimated 53 mmHg. IVC  dilated.  ? S/P Maze operation for atrial fibrillation 02/22/2017  ? S/P MVR (mitral valve repair) 02/22/2017  ? Overview:  02/22/17: 28 mm ring was chosen based on the size of the anterior mitral leaflet. 28 mm Tri-Ad ring for tricuspid valve.  ? S/P total knee replacement using cement 05/16/2015  ? S/P TVR (tricuspid valve repair) 02/22/2017  ? Seizure ([place]) 02/24/2017  ? Shortness of breath 03/31/2016  ? Shortness of breath dyspnea   ? with exertion  ? TIA (transient ischemic attack) 02/24/2017  ? Visual blurriness 03/09/2017  ? ? ?Past Surgical History:  ?Procedure Laterality Date  ? ABDOMINAL HYSTERECTOMY    ? ANTERIOR AND POSTERIOR REPAIR    ? CARDIAC SURGERY    ? micuspid and tricuspid valve in 02/22/17  ? CHOLECYSTECTOMY    ? COLONOSCOPY    ? ESOPHAGEAL DILATION    ? HERNIA REPAIR Left   ? Ingunial  ? IR THORACENTESIS ASP PLEURAL SPACE W/IMG GUIDE  04/11/2017  ? ROTATOR CUFF REPAIR Right 2010ish  ? TOTAL KNEE ARTHROPLASTY Left 05/16/2015  ? Procedure: LEFT TOTAL KNEE ARTHROPLASTY;  Surgeon: Netta Cedars, MD;  Location: [facility];  Service: Orthopedics;  Laterality: Left;  ? ? ?Current Medications: ?Current Meds  ?Medication Sig  ? b complex vitamins capsule Take 1 capsule by mouth daily. Unknown strength  ? cholecalciferol (VITAMIN D) 1000 units tablet Take 1,000 Units by mouth daily.  ? diltiazem (CARDIZEM CD) 120 MG 24 hr capsule TAKE  1 CAPSULE BY MOUTH EVERY DAY  ? ELIQUIS 5 MG TABS tablet TAKE 1 TABLET(5 MG) BY MOUTH TWICE DAILY (Patient taking differently: Take 5 mg by mouth 2 (two) times daily.)  ? KLOR-CON M10 10 MEQ tablet TAKE 2 TABLETS BY MOUTH DAILY (Patient taking differently: Take 20 mEq by mouth daily.)  ? spironolactone (ALDACTONE) 25 MG tablet TAKE 1/2 TABLET BY MOUTH EVERY DAY  ? torsemide (DEMADEX) 20 MG tablet TAKE 2 TABLETS IN THE MORNING AND 1 TABLET IN THE EVENING DAILY. (Patient taking differently: Take 20 mg by mouth 2 (two) times daily.)  ? traMADol (ULTRAM) 50 MG tablet Take 50 mg by mouth 3 (three)  times daily as needed for moderate pain.  ?  ? ?Allergies:   Heparin, Lovenox [enoxaparin sodium], Metoprolol, Percocet [oxycodone-acetaminophen], Prednisone, and Sulfonamide derivatives  ? ?Social History  ? ?Socioeconomic History  ? Marital status: Married  ?  Spouse name: Not on file  ? Number of children: Not on file  ? Years of education: Not on file  ? Highest education level: Not on file  ?Occupational History  ? Not on file  ?Tobacco Use  ? Smoking status: Former  ?  Years: 8.00  ?  Types: Cigarettes  ? Smokeless tobacco: Never  ? Tobacco comments:  ?  quit age 18  ?Vaping Use  ? Vaping Use: Never used  ?Substance and Sexual Activity  ? Alcohol use: No  ? Drug use: No  ? Sexual activity: Not on file  ?Other Topics Concern  ? Not on file  ?Social History Narrative  ? Not on file  ? ?Social Determinants of Health  ? ?Financial Resource Strain: Not on file  ?Food Insecurity: Not on file  ?Transportation Needs: Not on file  ?Physical Activity: Not on file  ?Stress: Not on file  ?Social Connections: Not on file  ?  ? ?Family History: ?The patient's family history includes Cancer in her father; Heart failure in her mother; Valvular heart disease in her mother. ?ROS:   ?Please see the history of present illness.    ?All 14 point review of systems negative except as described per history of present illness ? ?EKGs/Labs/Other Studies Reviewed:   ? ? ? ?Recent Labs: ?No results found for requested labs within last 8760 hours.  ?Recent Lipid Panel ?No results found for: CHOL, TRIG, HDL, CHOLHDL, VLDL, LDLCALC, LDLDIRECT ? ?Physical Exam:   ? ?VS:  BP 110/68 (BP Location: Left Leg, Patient Position: Sitting)   Pulse 88   Ht '5\' 6"'$  (1.676 m)   Wt 175 lb 9.6 oz (79.7 kg)   SpO2 97%   BMI 28.34 kg/m?    ? ?Wt Readings from Last 3 Encounters:  ?06/26/21 175 lb 9.6 oz (79.7 kg)  ?10/23/20 175 lb 9.6 oz (79.7 kg)  ?04/17/20 179 lb (81.2 kg)  ?  ? ?GEN:  Well nourished, well developed in no acute distress ?HEENT:  Normal ?NECK: No JVD; No carotid bruits ?LYMPHATICS: No lymphadenopathy ?CARDIAC: RRR, no murmurs, no rubs, no gallops ?RESPIRATORY:  Clear to auscultation without rales, wheezing or rhonchi  ?ABDOMEN: Soft, non-tender, non-distended ?MUSCULOSKELETAL:  No edema; No deformity  ?SKIN: Warm and dry ?LOWER EXTREMITIES: no swelling ?NEUROLOGIC:  Alert and oriented x 3 ?PSYCHIATRIC:  Normal affect  ? ?ASSESSMENT:   ? ?1. Paroxysmal atrial fibrillation (HCC)   ?2. Essential hypertension   ?3. Chronic diastolic congestive heart failure ([place])   ?4. S/P TVR (tricuspid valve repair)   ?5. S/P MVR (  mitral valve repair)   ?6. Dyslipidemia   ? ?PLAN:   ? ?In order of problems listed above: ? ?Paroxysmal atrial fibrillation maintaining sinus rhythm, continue anticoagulation she is on Eliquis ?Essential hypertension blood pressure well controlled continue present management. ?Chronic diastolic congestive heart failure compensated on appropriate medication which I continue ?Status post mitral valve repair and tricuspid valve repair very good repair is still holding well we will schedule her to have echocardiogram to assess the lesions ? ? ?Medication Adjustments/Labs and Tests Ordered: ?Current medicines are reviewed at length with the patient today.  Concerns regarding medicines are outlined above.  ?No orders of the defined types were placed in this encounter. ? ?Medication changes: No orders of the defined types were placed in this encounter. ? ? ?Signed, ?Park Liter, MD, [facility] ?06/26/2021 4:29 PM    ?[place] ?

## 2021-06-26 NOTE — Patient Instructions (Signed)

## 2021-07-03 ENCOUNTER — Ambulatory Visit (INDEPENDENT_AMBULATORY_CARE_PROVIDER_SITE_OTHER): Payer: Medicare HMO

## 2021-07-03 DIAGNOSIS — I480 Paroxysmal atrial fibrillation: Secondary | ICD-10-CM | POA: Diagnosis not present

## 2021-07-03 DIAGNOSIS — I5032 Chronic diastolic (congestive) heart failure: Secondary | ICD-10-CM

## 2021-07-03 DIAGNOSIS — Z9889 Other specified postprocedural states: Secondary | ICD-10-CM

## 2021-07-06 LAB — ECHOCARDIOGRAM COMPLETE
Area-P 1/2: 2.87 cm2
MV VTI: 1.06 cm2
S' Lateral: 2.5 cm

## 2021-07-31 ENCOUNTER — Other Ambulatory Visit: Payer: Self-pay | Admitting: Cardiology

## 2021-07-31 ENCOUNTER — Telehealth: Payer: Self-pay | Admitting: Cardiology

## 2021-07-31 ENCOUNTER — Other Ambulatory Visit: Payer: Self-pay

## 2021-07-31 MED ORDER — TORSEMIDE 20 MG PO TABS: ORAL_TABLET | ORAL | 2 refills | Status: DC

## 2021-07-31 NOTE — Telephone Encounter (Signed)
*  STAT* If patient is at the pharmacy, call can be transferred to refill team.   1. Which medications need to be refilled? (please list name of each medication and dose if known) torsemide (DEMADEX) 20 MG tablet  2. Which pharmacy/location (including street and city if local pharmacy) is medication to be sent to? [id number] [place], [place] - [address]. Do they need a 30 day or 90 day supply? [address]

## 2021-08-10 ENCOUNTER — Other Ambulatory Visit (HOSPITAL_COMMUNITY): Payer: Self-pay | Admitting: Internal Medicine

## 2021-08-10 ENCOUNTER — Other Ambulatory Visit: Payer: Self-pay | Admitting: Cardiology

## 2021-08-10 ENCOUNTER — Other Ambulatory Visit: Payer: Self-pay

## 2021-08-10 DIAGNOSIS — I4819 Other persistent atrial fibrillation: Secondary | ICD-10-CM

## 2021-08-10 NOTE — Telephone Encounter (Addendum)
Prescription refill request for Eliquis received. Indication: Afib  Last office visit: 06/26/21 Bing Matter)  Scr: 1.4 (05/11/21 via [facility]) Age: 81 Weight: 79.7kg  Appropriate dose and refill sent to requested pharmacy.

## 2021-08-12 ENCOUNTER — Other Ambulatory Visit: Payer: Self-pay | Admitting: Cardiology

## 2021-08-17 ENCOUNTER — Other Ambulatory Visit: Payer: Self-pay

## 2021-08-17 ENCOUNTER — Telehealth: Payer: Self-pay | Admitting: Cardiology

## 2021-08-17 MED ORDER — POTASSIUM CHLORIDE CRYS ER 10 MEQ PO TBCR: 20.0000 meq | EXTENDED_RELEASE_TABLET | Freq: Every day | ORAL | 3 refills | Status: DC

## 2021-08-17 NOTE — Telephone Encounter (Signed)
 *  STAT* If patient is at the pharmacy, call can be transferred to refill team.   1. Which medications need to be refilled? (please list name of each medication and dose if known) KLOR-CON M10 10 MEQ tablet  2. Which pharmacy/location (including street and city if local pharmacy) is medication to be sent to?[id number] [place], [place] - [address]. Do they need a 30 day or 90 day supply? 90 days  Pt said, pharmacy did not receive the refill per notes Transmission to pharmacy failed (08/10/2021 12:51 PM EDT) - please resend refill, pt is almost out of medications

## 2021-09-28 ENCOUNTER — Other Ambulatory Visit: Payer: Self-pay | Admitting: Cardiology

## 2021-10-28 ENCOUNTER — Other Ambulatory Visit: Payer: Self-pay | Admitting: Cardiology

## 2021-11-20 DIAGNOSIS — E860 Dehydration: Secondary | ICD-10-CM | POA: Diagnosis not present

## 2021-12-22 DIAGNOSIS — R002 Palpitations: Secondary | ICD-10-CM | POA: Diagnosis not present

## 2021-12-22 DIAGNOSIS — Z6829 Body mass index (BMI) 29.0-29.9, adult: Secondary | ICD-10-CM | POA: Diagnosis not present

## 2021-12-22 DIAGNOSIS — Z23 Encounter for immunization: Secondary | ICD-10-CM | POA: Diagnosis not present

## 2022-01-28 ENCOUNTER — Telehealth: Payer: Self-pay

## 2022-01-28 NOTE — Telephone Encounter (Signed)
Assistance application for Eliquis mailed out to the patient and LM notifying her.

## 2022-01-29 ENCOUNTER — Ambulatory Visit: Payer: Medicare HMO | Admitting: Cardiology

## 2022-03-05 DIAGNOSIS — R6883 Chills (without fever): Secondary | ICD-10-CM | POA: Diagnosis not present

## 2022-04-15 ENCOUNTER — Other Ambulatory Visit (HOSPITAL_COMMUNITY): Payer: Self-pay | Admitting: Cardiology

## 2022-04-15 DIAGNOSIS — I4819 Other persistent atrial fibrillation: Secondary | ICD-10-CM

## 2022-04-15 NOTE — Telephone Encounter (Signed)
Prescription refill request for Eliquis received. Indication: Afib  Last office visit: 06/26/21 Agustin Cree)  Scr: 1.4 (05/11/21 via [place])  Age: 82 Weight: 79.7kg  Appropriate dose. Refill sent.

## 2022-05-05 DIAGNOSIS — J329 Chronic sinusitis, unspecified: Secondary | ICD-10-CM | POA: Diagnosis not present

## 2022-05-05 DIAGNOSIS — Z6829 Body mass index (BMI) 29.0-29.9, adult: Secondary | ICD-10-CM | POA: Diagnosis not present

## 2022-05-13 DIAGNOSIS — Z6829 Body mass index (BMI) 29.0-29.9, adult: Secondary | ICD-10-CM | POA: Diagnosis not present

## 2022-05-13 DIAGNOSIS — Z0000 Encounter for general adult medical examination without abnormal findings: Secondary | ICD-10-CM | POA: Diagnosis not present

## 2022-05-13 DIAGNOSIS — E559 Vitamin D deficiency, unspecified: Secondary | ICD-10-CM | POA: Diagnosis not present

## 2022-05-13 DIAGNOSIS — E7800 Pure hypercholesterolemia, unspecified: Secondary | ICD-10-CM | POA: Diagnosis not present

## 2022-05-13 DIAGNOSIS — Z79899 Other long term (current) drug therapy: Secondary | ICD-10-CM | POA: Diagnosis not present

## 2022-05-13 DIAGNOSIS — M1990 Unspecified osteoarthritis, unspecified site: Secondary | ICD-10-CM | POA: Diagnosis not present

## 2022-05-13 DIAGNOSIS — I4891 Unspecified atrial fibrillation: Secondary | ICD-10-CM | POA: Diagnosis not present

## 2022-05-13 DIAGNOSIS — Z1331 Encounter for screening for depression: Secondary | ICD-10-CM | POA: Diagnosis not present

## 2022-05-19 ENCOUNTER — Ambulatory Visit: Payer: Medicare HMO | Attending: Cardiology | Admitting: Cardiology

## 2022-05-19 ENCOUNTER — Encounter: Payer: Self-pay | Admitting: Cardiology

## 2022-05-19 VITALS — BP 128/72 | HR 80 | Ht 65.5 in | Wt 174.8 lb

## 2022-05-19 DIAGNOSIS — R0609 Other forms of dyspnea: Secondary | ICD-10-CM

## 2022-05-19 DIAGNOSIS — Z9889 Other specified postprocedural states: Secondary | ICD-10-CM | POA: Diagnosis not present

## 2022-05-19 DIAGNOSIS — I5032 Chronic diastolic (congestive) heart failure: Secondary | ICD-10-CM | POA: Diagnosis not present

## 2022-05-19 DIAGNOSIS — Z8679 Personal history of other diseases of the circulatory system: Secondary | ICD-10-CM | POA: Diagnosis not present

## 2022-05-19 DIAGNOSIS — I7121 Aneurysm of the ascending aorta, without rupture: Secondary | ICD-10-CM | POA: Diagnosis not present

## 2022-05-19 DIAGNOSIS — Z86711 Personal history of pulmonary embolism: Secondary | ICD-10-CM

## 2022-05-19 NOTE — Addendum Note (Signed)
Addended by: Jacobo Forest D on: 05/19/2022 03:59 PM   Modules accepted: Orders

## 2022-05-19 NOTE — Patient Instructions (Addendum)

## 2022-05-19 NOTE — Progress Notes (Signed)
Cardiology Office Note:    Date:  05/19/2022   ID:  SHADAY KNICELEY, DOB 26-Dec-1940, MRN [id number]  PCP:  Ronita Hipps, MD  Cardiologist:  Jenne Campus, MD    Referring MD: Ronita Hipps, MD   Chief Complaint  Patient presents with   Follow-up    History of Present Illness:    Marissa Ponce is a 82 y.o. female past medical history significant for mitral valve repair as well as tricuspid valve repair with known in 2019, ascending aortic aneurysm measuring 44 mm, last time checked by echocardiogram in summer 2023 only 42 mm, essential hypertension, dyslipidemia, history of TIA. She comes today to months for follow-up.  Overall she is doing very well.  She denies of any chest pain tightness squeezing pressure burning chest.  She enjoys bowling she also enjoyed bridge game overall she likes the way she feels.  Does have some swelling of left lower extremity which is chronic she had it since the time of her left knee replacement surgery.  She is anticoagulated because of PE  Past Medical History:  Diagnosis Date   AF (atrial fibrillation) 03/06/2019   Arthritis    Ascending aortic aneurysm 04/11/2017   4.1 cm on CT scan done 04/09/17. Needs annual follow-up.   Complication of anesthesia    Diastolic CHF [id number]   Dyslipidemia 04/08/2017   Dysrhythmia    with caffine, "PVC's"   Essential hypertension    Heparin induced thrombocytopenia 03/11/2017   Hiatal hernia 03/06/2019   History of pulmonary embolism 04/08/2017   History of stroke 04/08/2017   Hypokalemia 04/12/2017   Migraines     history of none recent   Paroxysmal atrial fibrillation 03/07/2017   Persistent atrial fibrillation 03/31/2016   Pleural effusion 04/09/2017   PONV (postoperative nausea and vomiting)    Prediabetes 04/12/2017   Pulmonary hypertension 05/19/2016   Overview:  Risk factors include HFpEF, severe MR and atrial fibrillation.  PH Data: - TTE 04/13/16: LVEF 60-65%, LVH, LA moderately dilated. Mild aortic  valve leaflet thickening with sclerosis. Moderate MR. Mild to moderate TR. Moderate PH with RVSP estimated 53 mmHg. IVC dilated.   S/P Maze operation for atrial fibrillation 02/22/2017   S/P MVR (mitral valve repair) 02/22/2017   Overview:  02/22/17: 28 mm ring was chosen based on the size of the anterior mitral leaflet. 28 mm Tri-Ad ring for tricuspid valve.   S/P total knee replacement using cement 05/16/2015   S/P TVR (tricuspid valve repair) 02/22/2017   Seizure 02/24/2017   Shortness of breath 03/31/2016   Shortness of breath dyspnea    with exertion   TIA (transient ischemic attack) 02/24/2017   Visual blurriness 03/09/2017    Past Surgical History:  Procedure Laterality Date   ABDOMINAL HYSTERECTOMY     ANTERIOR AND POSTERIOR REPAIR     CARDIAC SURGERY     micuspid and tricuspid valve in 02/22/17   CHOLECYSTECTOMY     COLONOSCOPY     ESOPHAGEAL DILATION     HERNIA REPAIR Left    Ingunial   IR THORACENTESIS ASP PLEURAL SPACE W/IMG GUIDE  04/11/2017   ROTATOR CUFF REPAIR Right 2010ish   TOTAL KNEE ARTHROPLASTY Left 05/16/2015   Procedure: LEFT TOTAL KNEE ARTHROPLASTY;  Surgeon: Netta Cedars, MD;  Location: [facility];  Service: Orthopedics;  Laterality: Left;    Current Medications: Current Meds  Medication Sig   cholecalciferol (VITAMIN D) 1000 units tablet Take 1,000 Units by mouth daily.   diltiazem (  CARDIZEM CD) 120 MG 24 hr capsule TAKE 1 CAPSULE BY MOUTH EVERY DAY (Patient taking differently: Take 120 mg by mouth daily.)   ELIQUIS 5 MG TABS tablet TAKE 1 TABLET BY MOUTH TWICE A DAY   oxycodone (OXY-IR) 5 MG capsule Take 5 mg by mouth 2 (two) times daily as needed for moderate pain.   potassium chloride (KLOR-CON M10) 10 MEQ tablet Take 2 tablets (20 mEq total) by mouth daily.   spironolactone (ALDACTONE) 25 MG tablet TAKE 1/2 TABLET BY MOUTH EVERY DAY (Patient taking differently: Take 12.5 mg by mouth daily.)   torsemide (DEMADEX) 20 MG tablet TAKE 2 TABLETS IN THE MORNING AND 1 TABLET  IN THE EVENING DAILY. (Patient taking differently: Take 20 mg by mouth 2 (two) times daily. TAKE 2 TABLETS IN THE MORNING AND 1 TABLET IN THE EVENING DAILY.)   traMADol (ULTRAM) 50 MG tablet Take 50 mg by mouth 3 (three) times daily as needed for moderate pain.   [DISCONTINUED] b complex vitamins capsule Take 1 capsule by mouth daily. Unknown strength     Allergies:   Enoxaparin, Heparin, Lovenox [enoxaparin sodium], Metoprolol, Percocet [oxycodone-acetaminophen], Prednisone, Lacosamide, and Sulfonamide derivatives   Social History   Socioeconomic History   Marital status: Married    Spouse name: Not on file   Number of children: Not on file   Years of education: Not on file   Highest education level: Not on file  Occupational History   Not on file  Tobacco Use   Smoking status: Former    Years: 8    Types: Cigarettes   Smokeless tobacco: Never   Tobacco comments:    quit age 89  Vaping Use   Vaping Use: Never used  Substance and Sexual Activity   Alcohol use: No   Drug use: No   Sexual activity: Not on file  Other Topics Concern   Not on file  Social History Narrative   Not on file   Social Determinants of Health   Financial Resource Strain: Not on file  Food Insecurity: Not on file  Transportation Needs: Not on file  Physical Activity: Not on file  Stress: Not on file  Social Connections: Not on file     Family History: The patient's family history includes Cancer in her father; Heart failure in her mother; Valvular heart disease in her mother. ROS:   Please see the history of present illness.    All 14 point review of systems negative except as described per history of present illness  EKGs/Labs/Other Studies Reviewed:      Recent Labs: No results found for requested labs within last 365 days.  Recent Lipid Panel No results found for: "CHOL", "TRIG", "HDL", "CHOLHDL", "VLDL", "LDLCALC", "LDLDIRECT"  Physical Exam:    VS:  BP 128/72 (BP Location: Left  Arm, Patient Position: Sitting)   Pulse 80   Ht 5' 5.5" (1.664 m)   Wt 174 lb 12.8 oz (79.3 kg)   SpO2 96%   BMI 28.65 kg/m     Wt Readings from Last 3 Encounters:  05/19/22 174 lb 12.8 oz (79.3 kg)  06/26/21 175 lb 9.6 oz (79.7 kg)  10/23/20 175 lb 9.6 oz (79.7 kg)     GEN:  Well nourished, well developed in no acute distress HEENT: Normal NECK: No JVD; No carotid bruits LYMPHATICS: No lymphadenopathy CARDIAC: RRR, no murmurs, no rubs, no gallops RESPIRATORY:  Clear to auscultation without rales, wheezing or rhonchi  ABDOMEN: Soft, non-tender, non-distended MUSCULOSKELETAL:  No edema; No deformity  SKIN: Warm and dry LOWER EXTREMITIES: no swelling NEUROLOGIC:  Alert and oriented x 3 PSYCHIATRIC:  Normal affect   ASSESSMENT:    1. Aneurysm of ascending aorta without rupture   2. Chronic diastolic congestive heart failure   3. S/P TVR (tricuspid valve repair)   4. S/P Maze operation for atrial fibrillation   5. History of pulmonary embolism    PLAN:    In order of problems listed above:  Arteries of the ascending aorta echocardiogram will be done to recheck on the size of the aneurysm. Chronic diastolic congestive heart 3 seems to be compensated continue present management. Status post tricuspid valve repair as well as mitral valve repair.  Echocardiogram will be done to assess lesion. History of pulmonary emboli anticoagulation which I will continue. Dyslipidemia I did review K PN which show me total cholesterol 255 HDL 61, continue monitoring   Medication Adjustments/Labs and Tests Ordered: Current medicines are reviewed at length with the patient today.  Concerns regarding medicines are outlined above.  No orders of the defined types were placed in this encounter.  Medication changes: No orders of the defined types were placed in this encounter.   Signed, Park Liter, MD, [facility] 05/19/2022 3:53 PM    [place]

## 2022-06-03 ENCOUNTER — Ambulatory Visit: Payer: Medicare HMO | Attending: Cardiology

## 2022-06-03 DIAGNOSIS — R0609 Other forms of dyspnea: Secondary | ICD-10-CM | POA: Diagnosis not present

## 2022-06-03 LAB — ECHOCARDIOGRAM COMPLETE
MV VTI: 1.39 cm2
S' Lateral: 2.9 cm

## 2022-06-08 ENCOUNTER — Telehealth: Payer: Self-pay

## 2022-06-08 NOTE — Patient Outreach (Signed)
  Care Coordination   06/08/2022 Name: Marissa Ponce MRN: [id number] DOB: 1940-12-28   Care Coordination Outreach Attempts:  An unsuccessful telephone outreach was attempted today to offer the patient information about available care coordination services as a benefit of their health plan.   Follow Up Plan:  Additional outreach attempts will be made to offer the patient care coordination information and services.   Encounter Outcome:  No Answer   Care Coordination Interventions:  No, not indicated    Rowe Pavy, RN, BSN, [facility] [employer] [phone]

## 2022-06-09 ENCOUNTER — Telehealth: Payer: Self-pay

## 2022-06-09 NOTE — Patient Outreach (Signed)
  Care Coordination   Initial Visit Note   06/09/2022 Name: LEIAH GIANNOTTI MRN: [id number] DOB: 10-03-1940  PHILOMENE HAFF is a 82 y.o. year old female who sees Marylen Ponto, MD for primary care. I spoke with  Linton Ham by phone today.  What matters to the patients health and wellness today?  Placed call to patient today to review and offer [facility] care coordination program. Patient reports that she is doing well and denies any needs today.     SDOH assessments and interventions completed:  No     Care Coordination Interventions:  No, not indicated   Follow up plan: No further intervention required.   Encounter Outcome:  Pt. Refused   Rowe Pavy, RN, BSN, CEN [facility] [employer] [phone]

## 2022-06-09 NOTE — Telephone Encounter (Signed)
Echo Results reviewed with pt as per Dr. Krasowski's note.  Pt verbalized understanding and had no additional questions. Routed to PCP 

## 2022-07-29 DIAGNOSIS — L209 Atopic dermatitis, unspecified: Secondary | ICD-10-CM | POA: Diagnosis not present

## 2022-09-02 ENCOUNTER — Ambulatory Visit: Payer: Medicare HMO | Admitting: Podiatry

## 2022-09-02 ENCOUNTER — Ambulatory Visit (INDEPENDENT_AMBULATORY_CARE_PROVIDER_SITE_OTHER): Payer: Medicare HMO

## 2022-09-02 DIAGNOSIS — M722 Plantar fascial fibromatosis: Secondary | ICD-10-CM

## 2022-09-02 DIAGNOSIS — M79671 Pain in right foot: Secondary | ICD-10-CM

## 2022-09-02 NOTE — Progress Notes (Unsigned)
Chief Complaint  Patient presents with   Foot Pain    Pt states she is having right heel pain about 1 month ago she says it started out of no where it hurts more when she walks and at night it throbs she says she's been taking ibuprofen for swellings    HPI: 82 y.o. female presenting today with c/o pain in the bottom of the right heel.  She notes that she is not having much pain today, but she was when she called to make the appointment.  She notes that she cannot take NSAIDs due to being on a blood thinning agent.  Past Medical History:  Diagnosis Date   AF (atrial fibrillation) (HCC) 03/06/2019   Arthritis    Ascending aortic aneurysm (HCC) 04/11/2017   4.1 cm on CT scan done 04/09/17. Needs annual follow-up.   Complication of anesthesia    Diastolic CHF (HCC) 05/03/2017   Dyslipidemia 04/08/2017   Dysrhythmia    with caffine, "PVC's"   Essential hypertension    Heparin induced thrombocytopenia (HCC) 03/11/2017   Hiatal hernia 03/06/2019   History of pulmonary embolism 04/08/2017   History of stroke 04/08/2017   Hypokalemia 04/12/2017   Migraines     history of none recent   Paroxysmal atrial fibrillation (HCC) 03/07/2017   Persistent atrial fibrillation (HCC) 03/31/2016   Pleural effusion 04/09/2017   PONV (postoperative nausea and vomiting)    Prediabetes 04/12/2017   Pulmonary hypertension (HCC) 05/19/2016   Overview:  Risk factors include HFpEF, severe MR and atrial fibrillation.  PH Data: - TTE 04/13/16: LVEF 60-65%, LVH, LA moderately dilated. Mild aortic valve leaflet thickening with sclerosis. Moderate MR. Mild to moderate TR. Moderate PH with RVSP estimated 53 mmHg. IVC dilated.   S/P Maze operation for atrial fibrillation 02/22/2017   S/P MVR (mitral valve repair) 02/22/2017   Overview:  02/22/17: 28 mm ring was chosen based on the size of the anterior mitral leaflet. 28 mm Tri-Ad ring for tricuspid valve.   S/P total knee replacement using cement 05/16/2015   S/P TVR (tricuspid  valve repair) 02/22/2017   Seizure (HCC) 02/24/2017   Shortness of breath 03/31/2016   Shortness of breath dyspnea    with exertion   TIA (transient ischemic attack) 02/24/2017   Visual blurriness 03/09/2017    Past Surgical History:  Procedure Laterality Date   ABDOMINAL HYSTERECTOMY     ANTERIOR AND POSTERIOR REPAIR     CARDIAC SURGERY     micuspid and tricuspid valve in 02/22/17   CHOLECYSTECTOMY     COLONOSCOPY     ESOPHAGEAL DILATION     HERNIA REPAIR Left    Ingunial   IR THORACENTESIS ASP PLEURAL SPACE W/IMG GUIDE  04/11/2017   ROTATOR CUFF REPAIR Right 2010ish   TOTAL KNEE ARTHROPLASTY Left 05/16/2015   Procedure: LEFT TOTAL KNEE ARTHROPLASTY;  Surgeon: Beverely Low, MD;  Location: [facility] OR;  Service: Orthopedics;  Laterality: Left;    Allergies  Allergen Reactions   Enoxaparin Other (See Comments)   Heparin Other (See Comments)    HIT   Lovenox [Enoxaparin Sodium] Other (See Comments)    HIT   Metoprolol Shortness Of Breath   Percocet [Oxycodone-Acetaminophen] Nausea And Vomiting   Prednisone Other (See Comments)    wheeze   Lacosamide Other (See Comments)    Significant and intolerable nausea and vomiting   Sulfonamide Derivatives Rash     Physical Exam: General: The patient is alert and oriented x3 in no  acute distress.  Dermatology:  No ecchymosis, erythema, or edema bilateral.  No open lesions.    Vascular: Palpable pedal pulses bilaterally. Capillary refill within normal limits.  No appreciable edema.    Neurological: Light touch sensation intact bilateral.  MMT 5/5 to lower extremity bilateral. Negative Tinel's sign with percussion of the posterior tibial nerve on the affected extremity.    Musculoskeletal Exam:  There is pain on palpation of the plantarmedial & plantarcentral aspect of right heel.  No gaps or nodules within the plantar fascia.  Positive Windlass mechanism bilateral.  Antalgic gait noted with first few steps upon standing.  No pain on palpation  of achilles tendon bilateral.  Ankle df less than 10 degrees with knee extended b/l.  Radiographic Exam:  Normal osseous mineralization. Joint spaces preserved.  No fractures or osseous irregularities noted.  No calcaneal spur noted  Assessment/Plan of Care: 1. Plantar fascial fibromatosis   2. Intractable right heel pain   3. Pain of right heel     -Reviewed etiology of plantar fasciitis with patient.  Discussed treatment options with patient today, including cortisone injection, NSAID course of treatment, stretching exercises, physical therapy, use of night splint, rest, icing the heel, arch supports/orthotics, and supportive shoe gear.    Patient was fitted and dispensed a night splint.  This is a static AFO to be worn when sleeping/nonweightbearing, with a soft interface lining material.  She was fitted for power step inserts for arch support in her shoes.  Reviewed stretching exercises with patient.  Return in about 4 weeks (around 09/30/2022) for f/u plantar fasciitis.   Clerance Lav, DPM, FACFAS [facility]     [address] [place], [place] [address]                Office [phone]  Fax [phone]

## 2022-09-02 NOTE — Patient Instructions (Signed)

## 2022-09-03 ENCOUNTER — Other Ambulatory Visit: Payer: Self-pay | Admitting: Podiatry

## 2022-09-03 DIAGNOSIS — M79671 Pain in right foot: Secondary | ICD-10-CM

## 2022-09-16 DIAGNOSIS — S00411A Abrasion of right ear, initial encounter: Secondary | ICD-10-CM | POA: Diagnosis not present

## 2022-09-16 DIAGNOSIS — H6121 Impacted cerumen, right ear: Secondary | ICD-10-CM | POA: Diagnosis not present

## 2022-09-16 DIAGNOSIS — H6091 Unspecified otitis externa, right ear: Secondary | ICD-10-CM | POA: Diagnosis not present

## 2022-09-24 ENCOUNTER — Other Ambulatory Visit: Payer: Self-pay | Admitting: Cardiology

## 2022-09-30 DIAGNOSIS — H18893 Other specified disorders of cornea, bilateral: Secondary | ICD-10-CM | POA: Diagnosis not present

## 2022-10-19 ENCOUNTER — Other Ambulatory Visit: Payer: Self-pay | Admitting: Cardiology

## 2022-11-19 DIAGNOSIS — D044 Carcinoma in situ of skin of scalp and neck: Secondary | ICD-10-CM | POA: Diagnosis not present

## 2022-11-19 DIAGNOSIS — L821 Other seborrheic keratosis: Secondary | ICD-10-CM | POA: Diagnosis not present

## 2022-11-19 DIAGNOSIS — L570 Actinic keratosis: Secondary | ICD-10-CM | POA: Diagnosis not present

## 2022-11-19 DIAGNOSIS — L814 Other melanin hyperpigmentation: Secondary | ICD-10-CM | POA: Diagnosis not present

## 2022-11-19 DIAGNOSIS — D0439 Carcinoma in situ of skin of other parts of face: Secondary | ICD-10-CM | POA: Diagnosis not present

## 2022-12-10 DIAGNOSIS — H524 Presbyopia: Secondary | ICD-10-CM | POA: Diagnosis not present

## 2023-01-24 ENCOUNTER — Other Ambulatory Visit: Payer: Self-pay | Admitting: Cardiology

## 2023-01-25 ENCOUNTER — Other Ambulatory Visit (HOSPITAL_COMMUNITY): Payer: Self-pay | Admitting: Cardiology

## 2023-01-25 DIAGNOSIS — I4819 Other persistent atrial fibrillation: Secondary | ICD-10-CM

## 2023-01-25 NOTE — Telephone Encounter (Signed)
Prescription refill request for Eliquis received. Indication: PE Last office visit: 05/19/22 Bing Matter)  Scr: 1.1 (05/13/22 via [facility]) Age: 82 Weight: 79.3kg  Appropriate dose. Refill sent.

## 2023-03-01 ENCOUNTER — Other Ambulatory Visit: Payer: Self-pay | Admitting: Cardiology

## 2023-03-05 ENCOUNTER — Other Ambulatory Visit: Payer: Self-pay

## 2023-03-05 ENCOUNTER — Emergency Department (HOSPITAL_COMMUNITY): Payer: Medicare HMO

## 2023-03-05 ENCOUNTER — Emergency Department (HOSPITAL_COMMUNITY)
Admission: EM | Admit: 2023-03-05 | Discharge: 2023-03-05 | Disposition: A | Discharge status: Home / Self Care | Payer: Medicare HMO | Attending: Emergency Medicine | Admitting: Emergency Medicine

## 2023-03-05 ENCOUNTER — Encounter (HOSPITAL_COMMUNITY): Payer: Self-pay | Admitting: Emergency Medicine

## 2023-03-05 DIAGNOSIS — S8992XA Unspecified injury of left lower leg, initial encounter: Secondary | ICD-10-CM | POA: Diagnosis not present

## 2023-03-05 DIAGNOSIS — W01198A Fall on same level from slipping, tripping and stumbling with subsequent striking against other object, initial encounter: Secondary | ICD-10-CM | POA: Insufficient documentation

## 2023-03-05 DIAGNOSIS — S8012XA Contusion of left lower leg, initial encounter: Secondary | ICD-10-CM | POA: Insufficient documentation

## 2023-03-05 DIAGNOSIS — Z7901 Long term (current) use of anticoagulants: Secondary | ICD-10-CM | POA: Insufficient documentation

## 2023-03-05 NOTE — Discharge Instructions (Signed)
Return for any problem.  ?

## 2023-03-05 NOTE — ED Triage Notes (Signed)
Pt reports she tripped over a space heater causing her to fall, large hematoma noted to left lower leg, pt further reports pain to toes with bruising, pt is on Eliquis, denies hitting head and LOC

## 2023-03-05 NOTE — ED Provider Notes (Signed)
[place] EMERGENCY DEPARTMENT AT [facility] Provider Note   CSN: [id number] Arrival date & time: 03/05/23  1944     History  Chief Complaint  Patient presents with   Leg Injury    Marissa Ponce is a 83 y.o. female.  83 year old female presents with complaint of contusion to the left lower anterior shin.  She tripped over a small space heater this morning.  She is on Eliquis.  She struck her shin against the space heater and now reports bruising to the anterior shin.  She is ambulatory after the fall.  She did not strike her head.  She denies LOC.  She denies other injury.  The history is provided by the patient and medical records.       Home Medications Prior to Admission medications   Medication Sig Start Date End Date Taking? Authorizing Provider  cholecalciferol (VITAMIN D) 1000 units tablet Take 1,000 Units by mouth daily.    [provider]  diltiazem (CARDIZEM CD) 120 MG 24 hr capsule TAKE 1 CAPSULE BY MOUTH EVERY DAY 03/02/23   Georgeanna Lea, MD  ELIQUIS 5 MG TABS tablet TAKE 1 TABLET BY MOUTH TWICE A DAY 01/25/23   Georgeanna Lea, MD  oxycodone (OXY-IR) 5 MG capsule Take 5 mg by mouth 2 (two) times daily as needed for moderate pain. 03/21/20   [provider]  potassium chloride (KLOR-CON M10) 10 MEQ tablet Take 2 tablets (20 mEq total) by mouth daily. 01/24/23   Georgeanna Lea, MD  spironolactone (ALDACTONE) 25 MG tablet TAKE 1/2 TABLET BY MOUTH DAILY 09/24/22   Georgeanna Lea, MD  torsemide (DEMADEX) 20 MG tablet TAKE 2 TABLETS IN THE MORNING AND 1 TABLET IN THE EVENING DAILY. Patient taking differently: Take 20 mg by mouth 2 (two) times daily. TAKE 2 TABLETS IN THE MORNING AND 1 TABLET IN THE EVENING DAILY. 07/31/21   Georgeanna Lea, MD  traMADol (ULTRAM) 50 MG tablet Take 50 mg by mouth 3 (three) times daily as needed for moderate pain. 01/01/20   [provider]      Allergies    Enoxaparin, Heparin,  Lovenox [enoxaparin sodium], Metoprolol, Percocet [oxycodone-acetaminophen], Prednisone, Lacosamide, and Sulfonamide derivatives    Review of Systems   Review of Systems  All other systems reviewed and are negative.   Physical Exam Updated Vital Signs BP (!) 148/90 (BP Location: Left Arm)   Pulse 82   Temp 98 F (36.7 C) (Oral)   Resp 17   Ht 5' 5.5" (1.664 m)   Wt 77.1 kg   SpO2 100%   BMI 27.86 kg/m  Physical Exam Vitals and nursing note reviewed.  Constitutional:      General: She is not in acute distress.    Appearance: Normal appearance. She is well-developed.  HENT:     Head: Normocephalic and atraumatic.  Eyes:     Conjunctiva/sclera: Conjunctivae normal.     Pupils: Pupils are equal, round, and reactive to light.  Cardiovascular:     Rate and Rhythm: Normal rate and regular rhythm.     Heart sounds: Normal heart sounds.  Pulmonary:     Effort: Pulmonary effort is normal. No respiratory distress.     Breath sounds: Normal breath sounds.  Abdominal:     General: There is no distension.     Palpations: Abdomen is soft.     Tenderness: There is no abdominal tenderness.  Musculoskeletal:  General: No deformity. Normal range of motion.     Cervical back: Normal range of motion and neck supple.  Skin:    General: Skin is warm and dry.     Comments: Contusion to the anterior proximal shin on the left.  No break in the skin.  No active bleeding.  Ranging of the left knee and left ankle does not elicit pain.  Patient is ambulatory.  Distal left lower extremity is neurovascular intact.  Neurological:     General: No focal deficit present.     Mental Status: She is alert and oriented to person, place, and time.     ED Results / Procedures / Treatments   Labs (all labs ordered are listed, but only abnormal results are displayed) Labs Reviewed - No data to display  EKG None  Radiology DG Knee Complete 4 Views Left Result Date: 03/05/2023 CLINICAL DATA:   Fall, rule out fracture, rule hard hematoma noted to left lower leg. Patient is on Eliquis. EXAM: LEFT KNEE - COMPLETE 4+ VIEW; LEFT FOOT - COMPLETE 3+ VIEW; LEFT TIBIA AND FIBULA - 2 VIEW; LEFT ANKLE COMPLETE - 3+ VIEW COMPARISON:  05/16/2015 FINDINGS: Left TKA. No radiographic evidence of loosening. No acute fracture or dislocation in the left knee, tibia, fibula, ankle, or foot. No knee joint effusion. Soft tissues are radiographically unremarkable. IMPRESSION: No acute fracture or dislocation in the left knee, tibia, fibula, ankle, or foot. Electronically Signed   By: Minerva Fester M.D.   On: 03/05/2023 21:48   DG Tibia/Fibula Left Result Date: 03/05/2023 CLINICAL DATA:  Fall, rule out fracture, rule hard hematoma noted to left lower leg. Patient is on Eliquis. EXAM: LEFT KNEE - COMPLETE 4+ VIEW; LEFT FOOT - COMPLETE 3+ VIEW; LEFT TIBIA AND FIBULA - 2 VIEW; LEFT ANKLE COMPLETE - 3+ VIEW COMPARISON:  05/16/2015 FINDINGS: Left TKA. No radiographic evidence of loosening. No acute fracture or dislocation in the left knee, tibia, fibula, ankle, or foot. No knee joint effusion. Soft tissues are radiographically unremarkable. IMPRESSION: No acute fracture or dislocation in the left knee, tibia, fibula, ankle, or foot. Electronically Signed   By: Minerva Fester M.D.   On: 03/05/2023 21:48   DG Ankle Complete Left Result Date: 03/05/2023 CLINICAL DATA:  Fall, rule out fracture, rule hard hematoma noted to left lower leg. Patient is on Eliquis. EXAM: LEFT KNEE - COMPLETE 4+ VIEW; LEFT FOOT - COMPLETE 3+ VIEW; LEFT TIBIA AND FIBULA - 2 VIEW; LEFT ANKLE COMPLETE - 3+ VIEW COMPARISON:  05/16/2015 FINDINGS: Left TKA. No radiographic evidence of loosening. No acute fracture or dislocation in the left knee, tibia, fibula, ankle, or foot. No knee joint effusion. Soft tissues are radiographically unremarkable. IMPRESSION: No acute fracture or dislocation in the left knee, tibia, fibula, ankle, or foot. Electronically  Signed   By: Minerva Fester M.D.   On: 03/05/2023 21:48   DG Foot Complete Left Result Date: 03/05/2023 CLINICAL DATA:  Fall, rule out fracture, rule hard hematoma noted to left lower leg. Patient is on Eliquis. EXAM: LEFT KNEE - COMPLETE 4+ VIEW; LEFT FOOT - COMPLETE 3+ VIEW; LEFT TIBIA AND FIBULA - 2 VIEW; LEFT ANKLE COMPLETE - 3+ VIEW COMPARISON:  05/16/2015 FINDINGS: Left TKA. No radiographic evidence of loosening. No acute fracture or dislocation in the left knee, tibia, fibula, ankle, or foot. No knee joint effusion. Soft tissues are radiographically unremarkable. IMPRESSION: No acute fracture or dislocation in the left knee, tibia, fibula, ankle, or foot. Electronically Signed  By: Minerva Fester M.D.   On: 03/05/2023 21:48    Procedures Procedures    Medications Ordered in ED Medications - No data to display  ED Course/ Medical Decision Making/ A&P                                 Medical Decision Making Amount and/or Complexity of Data Reviewed Radiology: ordered.    Medical Screen Complete  This patient presented to the ED with complaint of left shin contusion.  This complaint involves an extensive number of treatment options. The initial differential diagnosis includes, but is not limited to, contusion versus fracture  This presentation is: Acute, Self-Limited, Previously Undiagnosed, Uncertain Prognosis, Complicated, Systemic Symptoms, and Threat to Life/Bodily Function  Patient presents for evaluation of injury to the left lower extremity.  Exam reveals contusion related to fall and concurrent use of Eliquis.   Imaging does not reveal evidence of fracture.  Patient is reassured by workup and imaging findings.  She understands need for close outpatient follow-up.  Strict return precautions given and understood.   Additional history obtained:  External records from outside sources obtained and reviewed including prior ED visits and prior Inpatient records.     Imaging Studies ordered:  I ordered imaging studies including plain films of left knee, left tib-fib, left ankle, left foot I independently visualized and interpreted obtained imaging which showed no fracture I agree with the radiologist interpretation.   Problem List / ED Course:  Contusion to the left lower extremity   Reevaluation:  After the interventions noted above, I reevaluated the patient and found that they have: improved  Disposition:  After consideration of the diagnostic results and the patients response to treatment, I feel that the patent would benefit from close outpatient follow-up.          Final Clinical Impression(s) / ED Diagnoses Final diagnoses:  Contusion of left lower extremity, initial encounter    Rx / DC Orders ED Discharge Orders     None         Wynetta Fines, MD 03/05/23 2158

## 2023-03-10 ENCOUNTER — Telehealth: Payer: Self-pay

## 2023-03-10 MED ORDER — APIXABAN 5 MG PO TABS: 5.0000 mg | ORAL_TABLET | Freq: Two times a day (BID) | ORAL | Status: DC

## 2023-03-10 NOTE — Telephone Encounter (Signed)
Samples of Eliquis 5mg  #56 provided for pt.

## 2023-04-14 DIAGNOSIS — S8012XA Contusion of left lower leg, initial encounter: Secondary | ICD-10-CM | POA: Diagnosis not present

## 2023-04-14 DIAGNOSIS — M25512 Pain in left shoulder: Secondary | ICD-10-CM | POA: Diagnosis not present

## 2023-04-16 ENCOUNTER — Other Ambulatory Visit: Payer: Self-pay | Admitting: Cardiology

## 2023-05-16 ENCOUNTER — Emergency Department (HOSPITAL_COMMUNITY)
Admission: EM | Admit: 2023-05-16 | Discharge: 2023-05-16 | Disposition: A | Discharge status: Home / Self Care | Attending: Emergency Medicine | Admitting: Emergency Medicine

## 2023-05-16 ENCOUNTER — Telehealth: Payer: Self-pay | Admitting: Cardiology

## 2023-05-16 ENCOUNTER — Other Ambulatory Visit: Payer: Self-pay

## 2023-05-16 ENCOUNTER — Emergency Department (HOSPITAL_COMMUNITY)

## 2023-05-16 ENCOUNTER — Encounter (HOSPITAL_COMMUNITY): Payer: Self-pay

## 2023-05-16 DIAGNOSIS — I4891 Unspecified atrial fibrillation: Secondary | ICD-10-CM | POA: Insufficient documentation

## 2023-05-16 DIAGNOSIS — Z7901 Long term (current) use of anticoagulants: Secondary | ICD-10-CM | POA: Insufficient documentation

## 2023-05-16 DIAGNOSIS — R002 Palpitations: Secondary | ICD-10-CM | POA: Diagnosis present

## 2023-05-16 DIAGNOSIS — I10 Essential (primary) hypertension: Secondary | ICD-10-CM | POA: Diagnosis not present

## 2023-05-16 DIAGNOSIS — I4892 Unspecified atrial flutter: Secondary | ICD-10-CM | POA: Diagnosis not present

## 2023-05-16 DIAGNOSIS — R0602 Shortness of breath: Secondary | ICD-10-CM | POA: Insufficient documentation

## 2023-05-16 DIAGNOSIS — R0989 Other specified symptoms and signs involving the circulatory and respiratory systems: Secondary | ICD-10-CM | POA: Diagnosis not present

## 2023-05-16 LAB — CBC WITH DIFFERENTIAL/PLATELET
Abs Immature Granulocytes: 0.02 10*3/uL (ref 0.00–0.07)
Basophils Absolute: 0 10*3/uL (ref 0.0–0.1)
Basophils Relative: 0 %
Eosinophils Absolute: 0.1 10*3/uL (ref 0.0–0.5)
Eosinophils Relative: 1 %
HCT: 41.1 % (ref 36.0–46.0)
Hemoglobin: 13.1 g/dL (ref 12.0–15.0)
Immature Granulocytes: 0 %
Lymphocytes Relative: 20 %
Lymphs Abs: 1.5 10*3/uL (ref 0.7–4.0)
MCH: 29 pg (ref 26.0–34.0)
MCHC: 31.9 g/dL (ref 30.0–36.0)
MCV: 90.9 fL (ref 80.0–100.0)
Monocytes Absolute: 0.5 10*3/uL (ref 0.1–1.0)
Monocytes Relative: 6 %
Neutro Abs: 5.1 10*3/uL (ref 1.7–7.7)
Neutrophils Relative %: 73 %
Platelets: 248 10*3/uL (ref 150–400)
RBC: 4.52 MIL/uL (ref 3.87–5.11)
RDW: 14.6 % (ref 11.5–15.5)
WBC: 7.1 10*3/uL (ref 4.0–10.5)
nRBC: 0 % (ref 0.0–0.2)

## 2023-05-16 LAB — BASIC METABOLIC PANEL WITH GFR
Anion gap: 9 (ref 5–15)
BUN: 19 mg/dL (ref 8–23)
CO2: 23 mmol/L (ref 22–32)
Calcium: 9.2 mg/dL (ref 8.9–10.3)
Chloride: 105 mmol/L (ref 98–111)
Creatinine, Ser: 1.02 mg/dL — ABNORMAL HIGH (ref 0.44–1.00)
GFR, Estimated: 56 mL/min — ABNORMAL LOW (ref >60)
Glucose, Bld: 157 mg/dL — ABNORMAL HIGH (ref 70–99)
Potassium: 3.8 mmol/L (ref 3.5–5.1)
Sodium: 137 mmol/L (ref 135–145)

## 2023-05-16 LAB — RESP PANEL BY RT-PCR (RSV, FLU A&B, COVID)  RVPGX2
Influenza A by PCR: NEGATIVE
Influenza B by PCR: NEGATIVE
Resp Syncytial Virus by PCR: NEGATIVE
SARS Coronavirus 2 by RT PCR: NEGATIVE

## 2023-05-16 LAB — BRAIN NATRIURETIC PEPTIDE: B Natriuretic Peptide: 200.7 pg/mL — ABNORMAL HIGH (ref 0.0–100.0)

## 2023-05-16 LAB — TROPONIN I (HIGH SENSITIVITY): Troponin I (High Sensitivity): 8 ng/L (ref <18)

## 2023-05-16 MED ORDER — DILTIAZEM HCL 25 MG/5ML IV SOLN
20.0000 mg | Freq: Once | INTRAVENOUS | Status: AC
  Administered 2023-05-16: 20 mg via INTRAVENOUS
  Filled 2023-05-16: qty 5

## 2023-05-16 MED ORDER — ALUM & MAG HYDROXIDE-SIMETH 200-200-20 MG/5ML PO SUSP
30.0000 mL | Freq: Once | ORAL | Status: AC
  Administered 2023-05-16: 30 mL via ORAL
  Filled 2023-05-16: qty 30

## 2023-05-16 NOTE — ED Triage Notes (Signed)
 Patient sent from PCP. Has atrial fibrillation and was at the doctors office where they told her she might have a flutter. Has had chest congestion for the last [redacted] weeks along with green sputum. Denies chest pain. Short of breath when ambulating.

## 2023-05-16 NOTE — Telephone Encounter (Signed)
 Pt's daughter calling to see if we rec'vd EKG that was supposed to be faxed over from pcp office for review. Wanting to also know if appt on 4/3 needs to be moved up. Requesting cb

## 2023-05-16 NOTE — ED Provider Notes (Signed)
 [place] EMERGENCY DEPARTMENT AT [facility] Provider Note   CSN: [id number] Arrival date & time: 05/16/23  1252     History  Chief Complaint  Patient presents with   Palpitations    Marissa Ponce is a 83 y.o. female.  Pt is a 83 yo female with pmhx significant for paroxysmal afib/aflutter (compliant with anticoagulants), htn, chf, pulmonary htn, and hx PE.  Pt has been sick with uri sx for the past few days.  She has been coughing up green sputum, but that is better. She did go to her pcp today who sent her here.  Pt said she's in and out of her afib all the time.  She is not sure how long she's been in afib this time.  Pt denies cp.        Home Medications Prior to Admission medications   Medication Sig Start Date End Date Taking? Authorizing Provider  apixaban (ELIQUIS) 5 MG TABS tablet Take 1 tablet (5 mg total) by mouth 2 (two) times daily. 03/10/23   Georgeanna Lea, MD  cholecalciferol (VITAMIN D) 1000 units tablet Take 1,000 Units by mouth daily.    [provider]  diltiazem (CARDIZEM CD) 120 MG 24 hr capsule TAKE 1 CAPSULE BY MOUTH EVERY DAY 03/02/23   Georgeanna Lea, MD  ELIQUIS 5 MG TABS tablet TAKE 1 TABLET BY MOUTH TWICE A DAY 01/25/23   Georgeanna Lea, MD  oxycodone (OXY-IR) 5 MG capsule Take 5 mg by mouth 2 (two) times daily as needed for moderate pain. 03/21/20   [provider]  potassium chloride (KLOR-CON M10) 10 MEQ tablet Take 2 tablets (20 mEq total) by mouth daily. 01/24/23   Georgeanna Lea, MD  spironolactone (ALDACTONE) 25 MG tablet Take 0.5 tablets (12.5 mg total) by mouth daily. 04/18/23   Georgeanna Lea, MD  torsemide (DEMADEX) 20 MG tablet TAKE 2 TABLETS IN THE MORNING AND 1 TABLET IN THE EVENING DAILY. Patient taking differently: Take 20 mg by mouth 2 (two) times daily. TAKE 2 TABLETS IN THE MORNING AND 1 TABLET IN THE EVENING DAILY. 07/31/21   Georgeanna Lea, MD  traMADol (ULTRAM) 50 MG tablet  Take 50 mg by mouth 3 (three) times daily as needed for moderate pain. 01/01/20   [provider]      Allergies    Enoxaparin, Heparin, Lovenox [enoxaparin sodium], Metoprolol, Percocet [oxycodone-acetaminophen], Prednisone, Lacosamide, and Sulfonamide derivatives    Review of Systems   Review of Systems  Respiratory:  Positive for shortness of breath.   Cardiovascular:  Positive for chest pain.  All other systems reviewed and are negative.   Physical Exam Updated Vital Signs BP 122/66   Pulse 67   Temp 97.7 F (36.5 C) (Oral)   Resp (!) 21   Ht 5\' 5"  (1.651 m)   Wt 76.7 kg   SpO2 98%   BMI 28.12 kg/m  Physical Exam Vitals and nursing note reviewed.  Constitutional:      Appearance: Normal appearance.  HENT:     Head: Normocephalic and atraumatic.     Right Ear: External ear normal.     Left Ear: External ear normal.     Nose: Nose normal.     Mouth/Throat:     Mouth: Mucous membranes are moist.     Pharynx: Oropharynx is clear.  Eyes:     Extraocular Movements: Extraocular movements intact.     Conjunctiva/sclera: Conjunctivae normal.  Pupils: Pupils are equal, round, and reactive to light.  Cardiovascular:     Rate and Rhythm: Tachycardia present. Rhythm irregular.     Pulses: Normal pulses.     Heart sounds: Normal heart sounds.  Pulmonary:     Effort: Pulmonary effort is normal.     Breath sounds: Normal breath sounds.  Abdominal:     General: Abdomen is flat. Bowel sounds are normal.     Palpations: Abdomen is soft.  Musculoskeletal:        General: Normal range of motion.     Cervical back: Normal range of motion and neck supple.  Skin:    General: Skin is warm.     Capillary Refill: Capillary refill takes less than 2 seconds.  Neurological:     General: No focal deficit present.     Mental Status: She is alert and oriented to person, place, and time.  Psychiatric:        Mood and Affect: Mood normal.        Behavior: Behavior normal.      ED Results / Procedures / Treatments   Labs (all labs ordered are listed, but only abnormal results are displayed) Labs Reviewed  BASIC METABOLIC PANEL WITH GFR - Abnormal; Notable for the following components:      Result Value   Glucose, Bld 157 (*)    Creatinine, Ser 1.02 (*)    GFR, Estimated 56 (*)    All other components within normal limits  BRAIN NATRIURETIC PEPTIDE - Abnormal; Notable for the following components:   B Natriuretic Peptide 200.7 (*)    All other components within normal limits  RESP PANEL BY RT-PCR (RSV, FLU A&B, COVID)  RVPGX2  CBC WITH DIFFERENTIAL/PLATELET  TSH  TROPONIN I (HIGH SENSITIVITY)  TROPONIN I (HIGH SENSITIVITY)    EKG EKG Interpretation Date/Time:  Monday May 16 2023 13:05:42 EDT Ventricular Rate:  98 PR Interval:    QRS Duration:  97 QT Interval:  384 QTC Calculation: 475 R Axis:   69  Text Interpretation: Atrial fibrillation Low voltage, precordial leads No significant change since last tracing Confirmed by Jacalyn Lefevre [phone]) on 05/16/2023 1:32:14 PM  Radiology DG Chest Port 1 View Result Date: 05/16/2023 CLINICAL DATA:  Short of breath, atrial flutter EXAM: PORTABLE CHEST 1 VIEW COMPARISON:  04/13/2017 FINDINGS: Single frontal view of the chest demonstrates stable enlargement of the cardiac silhouette. Postsurgical changes are seen from tricuspid and mitral valve repair. No acute airspace disease, effusion, or pneumothorax. Mild increased pulmonary vascular congestion. No acute bony abnormalities. IMPRESSION: 1. Stable enlarged cardiac silhouette. 2. Mild pulmonary vascular congestion without overt edema. Electronically Signed   By: Sharlet Salina M.D.   On: 05/16/2023 15:25    Procedures Procedures    Medications Ordered in ED Medications  diltiazem (CARDIZEM) injection 20 mg (20 mg Intravenous Given 05/16/23 1340)  alum & mag hydroxide-simeth (MAALOX/MYLANTA) 200-200-20 MG/5ML suspension 30 mL (30 mLs Oral Given  05/16/23 1519)    ED Course/ Medical Decision Making/ A&P                                 Medical Decision Making Amount and/or Complexity of Data Reviewed Labs: ordered. Radiology: ordered.  Risk OTC drugs. Prescription drug management.   This patient presents to the ED for concern of sob, this involves an extensive number of treatment options, and is a complaint that carries with it a  high risk of complications and morbidity.  The differential diagnosis includes afib with rvr, chf, pna, covid/flu/rsv   Co morbidities that complicate the patient evaluation  paroxysmal afib/aflutter (compliant with anticoagulants), htn, chf, pulmonary htn, and hx PE   Additional history obtained:  Additional history obtained from epic chart review External records from outside source obtained and reviewed including daughter   Lab Tests:  I Ordered, and personally interpreted labs.  The pertinent results include:  cbc nl, bmp nl other than cr 1.02, covid/flu/rsv neg; bnp 200.7, trop nl   Imaging Studies ordered:  I ordered imaging studies including cxr and ct chest I independently visualized and interpreted imaging which showed  CXR:  I agree with the radiologist interpretation   Cardiac Monitoring:  The patient was maintained on a cardiac monitor.  I personally viewed and interpreted the cardiac monitored which showed an underlying rhythm of: afib   Medicines ordered and prescription drug management:  I ordered medication including cardizem  for tachy  Reevaluation of the patient after these medicines showed that the patient improved I have reviewed the patients home medicines and have made adjustments as needed   Problem List / ED Course:  Afib/aflutter:  hr improved with cardizem.  Pt offered cardioversion, but she declined.  Pt has an appt with her cardiologist, Dr. Bing Matter on 4/3.  Pt is able to ambulate without any difficulty.  She is stable for d/c. Return if  worse. URI:  improving.  No evidence of pna, covid/flu/rsv   Reevaluation:  After the interventions noted above, I reevaluated the patient and found that they have :improved   Social Determinants of Health:  Lives at home   Dispostion:  After consideration of the diagnostic results and the patients response to treatment, I feel that the patent would benefit from discharge with outpatient f/u.          Final Clinical Impression(s) / ED Diagnoses Final diagnoses:  Atrial fibrillation with rapid ventricular response [facility] [place])    Rx / DC Orders ED Discharge Orders     None         Jacalyn Lefevre, MD 05/16/23 1558

## 2023-05-16 NOTE — Telephone Encounter (Signed)
 Spoke with pts daughter. PCP did an EKG, pt was short of breath and has not felt well. She is at [facility] for evaluation at this time. Advised to keep follow up appt on Thursday if she is not admitted. Daughter agreed and verbalized understanding. She had no further questions.

## 2023-05-17 DIAGNOSIS — M25512 Pain in left shoulder: Secondary | ICD-10-CM | POA: Insufficient documentation

## 2023-05-17 DIAGNOSIS — M19012 Primary osteoarthritis, left shoulder: Secondary | ICD-10-CM | POA: Diagnosis not present

## 2023-05-17 DIAGNOSIS — M19019 Primary osteoarthritis, unspecified shoulder: Secondary | ICD-10-CM | POA: Insufficient documentation

## 2023-05-19 ENCOUNTER — Ambulatory Visit: Attending: Cardiology | Admitting: Cardiology

## 2023-05-19 ENCOUNTER — Telehealth: Payer: Self-pay | Admitting: Pharmacy Technician

## 2023-05-19 ENCOUNTER — Encounter: Payer: Self-pay | Admitting: Cardiology

## 2023-05-19 ENCOUNTER — Other Ambulatory Visit (HOSPITAL_COMMUNITY): Payer: Self-pay

## 2023-05-19 VITALS — BP 120/82 | HR 54 | Resp 97 | Ht 65.0 in | Wt 170.8 lb

## 2023-05-19 DIAGNOSIS — I10 Essential (primary) hypertension: Secondary | ICD-10-CM | POA: Diagnosis not present

## 2023-05-19 DIAGNOSIS — I480 Paroxysmal atrial fibrillation: Secondary | ICD-10-CM

## 2023-05-19 DIAGNOSIS — I5032 Chronic diastolic (congestive) heart failure: Secondary | ICD-10-CM | POA: Diagnosis not present

## 2023-05-19 DIAGNOSIS — I7121 Aneurysm of the ascending aorta, without rupture: Secondary | ICD-10-CM | POA: Diagnosis not present

## 2023-05-19 DIAGNOSIS — Z9889 Other specified postprocedural states: Secondary | ICD-10-CM

## 2023-05-19 DIAGNOSIS — Z8679 Personal history of other diseases of the circulatory system: Secondary | ICD-10-CM

## 2023-05-19 MED ORDER — AMIODARONE HCL 200 MG PO TABS: 200.0000 mg | ORAL_TABLET | Freq: Two times a day (BID) | ORAL | 3 refills | Status: DC

## 2023-05-19 NOTE — Addendum Note (Signed)
 Addended by: Baldo Ash D on: 05/19/2023 11:02 AM   Modules accepted: Orders

## 2023-05-19 NOTE — Telephone Encounter (Signed)
 I called the patient to let her know that we were going to mail her the application for eliquis and she said she knows they will not qualify so for [place] to not send it. I advised her of calling the number on the back of her [employer] card to ask about the medicare payment plan. She said she will do that or just pay for it.

## 2023-05-19 NOTE — Progress Notes (Signed)
 Cardiology Office Note:    Date:  05/19/2023   ID:  Marissa Ponce, DOB 1940-04-20, MRN [id number]  PCP:  Marylen Ponto, MD  Cardiologist:  Gypsy Balsam, MD    Referring MD: Marylen Ponto, MD   Chief Complaint  Patient presents with   Atrial Flutter    History of Present Illness:    Marissa Ponce is a 83 y.o. female past medical history significant for mitral valve repair as well as tricuspid valve repair both done in 2019 at [facility], ascending aortic aneurysm measuring 41 mm last assessment in 2019, essential hypertension, dyslipidemia.  Comes today to my office after she was diagnosed with atrial flutter apparently she was sick for about 3 weeks then she went to her primary care physician she was noted to be in atrial flutter with fast ventricular rate, Cardizem CD has been given 120 mg rate better controlled.  She felt her heart speeding up but overall did not feel that bad.  Denies have any chest pain tightness squeezing pressure burning chest no swelling of lower extremities otherwise seems to be doing well  Past Medical History:  Diagnosis Date   AF (atrial fibrillation) (HCC) 03/06/2019   Arthritis    Ascending aortic aneurysm (HCC) 04/11/2017   4.1 cm on CT scan done 04/09/17. Needs annual follow-up.   Complication of anesthesia    Diastolic CHF (HCC) 05/03/2017   Dyslipidemia 04/08/2017   Dysrhythmia    with caffine, "PVC's"   Essential hypertension    Heparin induced thrombocytopenia (HCC) 03/11/2017   Hiatal hernia 03/06/2019   History of pulmonary embolism 04/08/2017   History of stroke 04/08/2017   Hypokalemia 04/12/2017   Migraines     history of none recent   Paroxysmal atrial fibrillation (HCC) 03/07/2017   Persistent atrial fibrillation (HCC) 03/31/2016   Pleural effusion 04/09/2017   PONV (postoperative nausea and vomiting)    Prediabetes 04/12/2017   Pulmonary hypertension (HCC) 05/19/2016   Overview:  Risk factors include HFpEF, severe MR and atrial fibrillation.   PH Data: - TTE 04/13/16: LVEF 60-65%, LVH, LA moderately dilated. Mild aortic valve leaflet thickening with sclerosis. Moderate MR. Mild to moderate TR. Moderate PH with RVSP estimated 53 mmHg. IVC dilated.   S/P Maze operation for atrial fibrillation 02/22/2017   S/P MVR (mitral valve repair) 02/22/2017   Overview:  02/22/17: 28 mm ring was chosen based on the size of the anterior mitral leaflet. 28 mm Tri-Ad ring for tricuspid valve.   S/P total knee replacement using cement 05/16/2015   S/P TVR (tricuspid valve repair) 02/22/2017   Seizure (HCC) 02/24/2017   Shortness of breath 03/31/2016   Shortness of breath dyspnea    with exertion   TIA (transient ischemic attack) 02/24/2017   Visual blurriness 03/09/2017    Past Surgical History:  Procedure Laterality Date   ABDOMINAL HYSTERECTOMY     ANTERIOR AND POSTERIOR REPAIR     CARDIAC SURGERY     micuspid and tricuspid valve in 02/22/17   CHOLECYSTECTOMY     COLONOSCOPY     ESOPHAGEAL DILATION     HERNIA REPAIR Left    [place]   IR THORACENTESIS ASP PLEURAL SPACE W/IMG GUIDE  04/11/2017   ROTATOR CUFF REPAIR Right 2010ish   TOTAL KNEE ARTHROPLASTY Left 05/16/2015   Procedure: LEFT TOTAL KNEE ARTHROPLASTY;  Surgeon: Beverely Low, MD;  Location: [facility] OR;  Service: Orthopedics;  Laterality: Left;    Current Medications: Current Meds  Medication Sig  apixaban (ELIQUIS) 5 MG TABS tablet Take 1 tablet (5 mg total) by mouth 2 (two) times daily.   cholecalciferol (VITAMIN D) 1000 units tablet Take 1,000 Units by mouth daily.   diltiazem (CARDIZEM CD) 120 MG 24 hr capsule TAKE 1 CAPSULE BY MOUTH EVERY DAY (Patient taking differently: Take 120 mg by mouth daily.)   ELIQUIS 5 MG TABS tablet TAKE 1 TABLET BY MOUTH TWICE A DAY   oxycodone (OXY-IR) 5 MG capsule Take 5 mg by mouth 2 (two) times daily as needed for moderate pain.   potassium chloride (KLOR-CON M10) 10 MEQ tablet Take 2 tablets (20 mEq total) by mouth daily.   spironolactone (ALDACTONE) 25 MG  tablet Take 0.5 tablets (12.5 mg total) by mouth daily.   torsemide (DEMADEX) 20 MG tablet TAKE 2 TABLETS IN THE MORNING AND 1 TABLET IN THE EVENING DAILY. (Patient taking differently: Take 20 mg by mouth 2 (two) times daily. TAKE 2 TABLETS IN THE MORNING AND 1 TABLET IN THE EVENING DAILY.)   traMADol (ULTRAM) 50 MG tablet Take 50 mg by mouth 3 (three) times daily as needed for moderate pain.     Allergies:   Enoxaparin, Heparin, Lovenox [enoxaparin sodium], Metoprolol, Percocet [oxycodone-acetaminophen], Prednisone, Lacosamide, and Sulfonamide derivatives   Social History   Socioeconomic History   Marital status: Married    Spouse name: Not on file   Number of children: Not on file   Years of education: Not on file   Highest education level: Not on file  Occupational History   Not on file  Tobacco Use   Smoking status: Former    Types: Cigarettes   Smokeless tobacco: Never   Tobacco comments:    quit age 24  Vaping Use   Vaping status: Never Used  Substance and Sexual Activity   Alcohol use: No   Drug use: No   Sexual activity: Not on file  Other Topics Concern   Not on file  Social History Narrative   Not on file   Social Drivers of Health   Financial Resource Strain: Not on file  Food Insecurity: Not on file  Transportation Needs: Not on file  Physical Activity: Not on file  Stress: Not on file  Social Connections: Not on file     Family History: The patient's family history includes Cancer in her father; Heart failure in her mother; Valvular heart disease in her mother. ROS:   Please see the history of present illness.    All 14 point review of systems negative except as described per history of present illness  EKGs/Labs/Other Studies Reviewed:         Recent Labs: 05/16/2023: B Natriuretic Peptide 200.7; BUN 19; Creatinine, Ser 1.02; Hemoglobin 13.1; Platelets 248; Potassium 3.8; Sodium 137  Recent Lipid Panel No results found for: "CHOL", "TRIG", "HDL",  "CHOLHDL", "VLDL", "LDLCALC", "LDLDIRECT"  Physical Exam:    VS:  BP 120/82 (BP Location: Right Arm, Patient Position: Sitting)   Pulse (!) 54   Resp (!) 97   Ht 5\' 5"  (1.651 m)   Wt 170 lb 12.8 oz (77.5 kg)   BMI 28.42 kg/m     Wt Readings from Last 3 Encounters:  05/19/23 170 lb 12.8 oz (77.5 kg)  05/16/23 169 lb (76.7 kg)  03/05/23 170 lb (77.1 kg)     GEN:  Well nourished, well developed in no acute distress HEENT: Normal NECK: No JVD; No carotid bruits LYMPHATICS: No lymphadenopathy CARDIAC: RRR, no murmurs, no rubs, no  gallops RESPIRATORY:  Clear to auscultation without rales, wheezing or rhonchi  ABDOMEN: Soft, non-tender, non-distended MUSCULOSKELETAL:  No edema; No deformity  SKIN: Warm and dry LOWER EXTREMITIES: no swelling NEUROLOGIC:  Alert and oriented x 3 PSYCHIATRIC:  Normal affect   ASSESSMENT:    1. Essential hypertension   2. Paroxysmal atrial fibrillation (HCC)   3. Aneurysm of ascending aorta without rupture (HCC)   4. Chronic diastolic congestive heart failure (HCC)   5. S/P Maze operation for atrial fibrillation   6. S/P MVR (mitral valve repair)   7. S/P TVR (tricuspid valve repair)    PLAN:    In order of problems listed above:  Supples mitral valve repair tricuspid valve repair, we will schedule her to have echocardiogram however I want a wait until her rhythm will be more stable. Paroxysmal atrial fibrillation.  She is taking anticoagulation which I will continue.  We had a long discussion about what to do with the situation.  I put her on amiodarone 200 mg twice daily I will bring her back in my office next week to repeat EKG.  Anticipate in the future may be needed to discontinue her Cardizem.  I will bring her otherwise back to my office in about 1 month and at that time we will decide about potential cardioversion. Essential hypertension: Blood pressure seems to well-controlled continue present management. Ascending aortic aneurysm will  need to be investigated.  Will start with echocardiogram but she will require CT of the chest   Medication Adjustments/Labs and Tests Ordered: Current medicines are reviewed at length with the patient today.  Concerns regarding medicines are outlined above.  Orders Placed This Encounter  Procedures   EKG 12-Lead   Medication changes: No orders of the defined types were placed in this encounter.   Signed, Georgeanna Lea, MD, [facility] 05/19/2023 10:50 AM    [facility]

## 2023-05-19 NOTE — Patient Instructions (Addendum)
 Medication Instructions:   START: Amiodarone 200mg  1 tablet twice daily    Lab Work: TSH, ALT, AST- today If you have labs (blood work) drawn today and your tests are completely normal, you will receive your results only by: MyChart Message (if you have MyChart) OR A paper copy in the mail If you have any lab test that is abnormal or we need to change your treatment, we will call you to review the results.   Testing/Procedures: None Ordered   Follow-Up: At [facility], you and your health needs are our priority.  As part of our continuing mission to provide you with exceptional heart care, we have created designated Provider Care Teams.  These Care Teams include your primary Cardiologist (physician) and Advanced Practice Providers (APPs -  Physician Assistants and Nurse Practitioners) who all work together to provide you with the care you need, when you need it.  We recommend signing up for the patient portal called "MyChart".  Sign up information is provided on this After Visit Summary.  MyChart is used to connect with patients for Virtual Visits (Telemedicine).  Patients are able to view lab/test results, encounter notes, upcoming appointments, etc.  Non-urgent messages can be sent to your provider as well.   To learn more about what you can do with MyChart, go to [email or web address].    Your next appointment:   1 month(s)  The format for your next appointment:   In Person  Provider:   Gypsy Balsam, MD    Other Instructions Nurse visit- 1 week EKG

## 2023-05-20 LAB — AST: AST: 21 IU/L (ref 0–40)

## 2023-05-20 LAB — ALT: ALT: 11 IU/L (ref 0–32)

## 2023-05-20 LAB — TSH: TSH: 4.18 u[IU]/mL (ref 0.450–4.500)

## 2023-05-21 ENCOUNTER — Other Ambulatory Visit: Payer: Self-pay | Admitting: Cardiology

## 2023-05-26 ENCOUNTER — Ambulatory Visit: Admitting: Emergency Medicine

## 2023-05-26 VITALS — BP 126/78 | HR 98 | Ht 65.0 in | Wt 170.0 lb

## 2023-05-26 DIAGNOSIS — I480 Paroxysmal atrial fibrillation: Secondary | ICD-10-CM

## 2023-05-26 NOTE — Progress Notes (Signed)
   Nurse Visit   Date of Encounter: 05/26/2023 ID: DHANA TOTTON, DOB 10-11-1940, MRN [id number]  PCP:  Marylen Ponto, MD   [facility] Providers Cardiologist:  Birdena Jubilee, MD      Visit Details   VS:  There were no vitals taken for this visit. , BMI There is no height or weight on file to calculate BMI.  Wt Readings from Last 3 Encounters:  05/19/23 170 lb 12.8 oz (77.5 kg)  05/16/23 169 lb (76.7 kg)  03/05/23 170 lb (77.1 kg)     Reason for visit: EKG Performed today: EKG, and vital signs, consulted Dr. Bing Matter, and  no changes at this time.  Changes (medications, testing, etc.) : No changes Length of Visit: 15 minutes    Medications Adjustments/Labs and Tests Ordered: Orders Placed This Encounter  Procedures   EKG 12-Lead   No orders of the defined types were placed in this encounter.    Signed, Lonia Farber, RN  05/26/2023 11:01 AM

## 2023-06-01 ENCOUNTER — Ambulatory Visit: Payer: Medicare HMO | Admitting: Cardiology

## 2023-06-01 ENCOUNTER — Telehealth: Payer: Self-pay

## 2023-06-01 NOTE — Telephone Encounter (Signed)
 Lab Results reviewed with pt as per Dr. Vanetta Shawl note.  Pt verbalized understanding and had no additional questions. Routed to PCP

## 2023-06-01 NOTE — Telephone Encounter (Addendum)
 Spoke with pt regarding lab results. She stated that she wants to stop the Amiodarone because she does not like taking it. Encouraged her to discuss this with Dr. Krasowski at her 1 month follow up. She asked that I asked him now. Dr. Krasowski recommended that she come in for an appt to discuss options before stopping Amiodarone. Information sent to front desk to call for sooner appt.

## 2023-06-06 ENCOUNTER — Encounter: Payer: Self-pay | Admitting: Cardiology

## 2023-06-06 ENCOUNTER — Ambulatory Visit: Attending: Cardiology | Admitting: Cardiology

## 2023-06-06 VITALS — BP 112/70 | HR 93 | Ht 65.0 in | Wt 170.0 lb

## 2023-06-06 DIAGNOSIS — I480 Paroxysmal atrial fibrillation: Secondary | ICD-10-CM | POA: Diagnosis not present

## 2023-06-06 DIAGNOSIS — I484 Atypical atrial flutter: Secondary | ICD-10-CM

## 2023-06-06 DIAGNOSIS — Z8679 Personal history of other diseases of the circulatory system: Secondary | ICD-10-CM | POA: Diagnosis not present

## 2023-06-06 DIAGNOSIS — I4892 Unspecified atrial flutter: Secondary | ICD-10-CM | POA: Insufficient documentation

## 2023-06-06 DIAGNOSIS — I10 Essential (primary) hypertension: Secondary | ICD-10-CM | POA: Diagnosis not present

## 2023-06-06 DIAGNOSIS — Z9889 Other specified postprocedural states: Secondary | ICD-10-CM | POA: Diagnosis not present

## 2023-06-06 MED ORDER — APIXABAN 5 MG PO TABS: 5.0000 mg | ORAL_TABLET | Freq: Two times a day (BID) | ORAL | 0 refills | Status: DC

## 2023-06-06 MED ORDER — APIXABAN 5 MG PO TABS: 5.0000 mg | ORAL_TABLET | Freq: Two times a day (BID) | ORAL | Status: DC

## 2023-06-06 NOTE — Addendum Note (Signed)
 Addended by: Einar Grave on: 06/06/2023 11:17 AM   Modules accepted: Orders

## 2023-06-06 NOTE — Progress Notes (Signed)
 Cardiology Office Note:    Date:  06/06/2023   ID:  AITANNA Ponce, DOB Jan 19, 1941, MRN [id number]  PCP:  Gaither Juba, MD  Cardiologist:  Ralene Burger, MD    Referring MD: Gaither Juba, MD   Chief Complaint  Patient presents with   Follow-up    History of Present Illness:    Marissa Ponce is a 83 y.o. female  past medical history significant for mitral valve repair as well as tricuspid valve repair both done in 2019 at [facility], ascending aortic aneurysm measuring 41 mm last assessment in 2019, essential hypertension, dyslipidemia.  I did see her last time few weeks ago because of episode of atrial flutter.  I tried to put her on amiodarone  with intention to cardiovert her electrically however she could not tolerate amiodarone .  She is still in atrial flutter rate is controlled she feels good.  Denies have any chest pain tightness squeezing pressure pain chest rate is controlled.  Past Medical History:  Diagnosis Date   AF (atrial fibrillation) (HCC) 03/06/2019   Arthritis    Ascending aortic aneurysm (HCC) 04/11/2017   4.1 cm on CT scan done 04/09/17. Needs annual follow-up.   Complication of anesthesia    Diastolic CHF (HCC) 05/03/2017   Dyslipidemia 04/08/2017   Dysrhythmia    with caffine, "PVC's"   Essential hypertension    Heparin induced thrombocytopenia (HCC) 03/11/2017   Hiatal hernia 03/06/2019   History of pulmonary embolism 04/08/2017   History of stroke 04/08/2017   Hypokalemia 04/12/2017   Migraines     history of none recent   Paroxysmal atrial fibrillation (HCC) 03/07/2017   Persistent atrial fibrillation (HCC) 03/31/2016   Pleural effusion 04/09/2017   PONV (postoperative nausea and vomiting)    Prediabetes 04/12/2017   Pulmonary hypertension (HCC) 05/19/2016   Overview:  Risk factors include HFpEF, severe MR and atrial fibrillation.  PH Data: - TTE 04/13/16: LVEF 60-65%, LVH, LA moderately dilated. Mild aortic valve leaflet thickening with sclerosis. Moderate MR.  Mild to moderate TR. Moderate PH with RVSP estimated 53 mmHg. IVC dilated.   S/P Maze operation for atrial fibrillation 02/22/2017   S/P MVR (mitral valve repair) 02/22/2017   Overview:  02/22/17: 28 mm ring was chosen based on the size of the anterior mitral leaflet. 28 mm Tri-Ad ring for tricuspid valve.   S/P total knee replacement using cement 05/16/2015   S/P TVR (tricuspid valve repair) 02/22/2017   Seizure (HCC) 02/24/2017   Shortness of breath 03/31/2016   Shortness of breath dyspnea    with exertion   TIA (transient ischemic attack) 02/24/2017   Visual blurriness 03/09/2017    Past Surgical History:  Procedure Laterality Date   ABDOMINAL HYSTERECTOMY     ANTERIOR AND POSTERIOR REPAIR     CARDIAC SURGERY     micuspid and tricuspid valve in 02/22/17   CHOLECYSTECTOMY     COLONOSCOPY     ESOPHAGEAL DILATION     HERNIA REPAIR Left    Ingunial   IR THORACENTESIS ASP PLEURAL SPACE W/IMG GUIDE  04/11/2017   ROTATOR CUFF REPAIR Right 2010ish   TOTAL KNEE ARTHROPLASTY Left 05/16/2015   Procedure: LEFT TOTAL KNEE ARTHROPLASTY;  Surgeon: Winston Hawking, MD;  Location: [facility] OR;  Service: Orthopedics;  Laterality: Left;    Current Medications: Current Meds  Medication Sig   apixaban  (ELIQUIS ) 5 MG TABS tablet Take 1 tablet (5 mg total) by mouth 2 (two) times daily.   cholecalciferol (VITAMIN D) 1000  units tablet Take 1,000 Units by mouth daily.   diltiazem  (CARDIZEM  CD) 120 MG 24 hr capsule TAKE 1 CAPSULE BY MOUTH EVERY DAY   ELIQUIS  5 MG TABS tablet TAKE 1 TABLET BY MOUTH TWICE A DAY   oxycodone (OXY-IR) 5 MG capsule Take 5 mg by mouth 2 (two) times daily as needed for moderate pain.   potassium chloride  (KLOR-CON  M10) 10 MEQ tablet Take 2 tablets (20 mEq total) by mouth daily.   spironolactone  (ALDACTONE ) 25 MG tablet TAKE 1/2 TABLET BY MOUTH EVERY DAY   torsemide  (DEMADEX ) 20 MG tablet TAKE 2 TABLETS IN THE MORNING AND 1 TABLET IN THE EVENING DAILY.   traMADol  (ULTRAM ) 50 MG tablet Take 50 mg  by mouth 3 (three) times daily as needed for moderate pain.     Allergies:   Enoxaparin , Heparin, Lovenox  [enoxaparin  sodium], Metoprolol, Percocet [oxycodone-acetaminophen ], Prednisone, Lacosamide, and Sulfonamide derivatives   Social History   Socioeconomic History   Marital status: Married    Spouse name: Not on file   Number of children: Not on file   Years of education: Not on file   Highest education level: Not on file  Occupational History   Not on file  Tobacco Use   Smoking status: Former    Types: Cigarettes   Smokeless tobacco: Never   Tobacco comments:    quit age 51  Vaping Use   Vaping status: Never Used  Substance and Sexual Activity   Alcohol use: No   Drug use: No   Sexual activity: Not on file  Other Topics Concern   Not on file  Social History Narrative   Not on file   Social Drivers of Health   Financial Resource Strain: Not on file  Food Insecurity: Not on file  Transportation Needs: Not on file  Physical Activity: Not on file  Stress: Not on file  Social Connections: Not on file     Family History: The patient's family history includes Cancer in her father; Heart failure in her mother; Valvular heart disease in her mother. ROS:   Please see the history of present illness.    All 14 point review of systems negative except as described per history of present illness  EKGs/Labs/Other Studies Reviewed:         Recent Labs: 05/16/2023: B Natriuretic Peptide 200.7; BUN 19; Creatinine, Ser 1.02; Hemoglobin 13.1; Platelets 248; Potassium 3.8; Sodium 137 05/19/2023: ALT 11; TSH 4.180  Recent Lipid Panel No results found for: "CHOL", "TRIG", "HDL", "CHOLHDL", "VLDL", "LDLCALC", "LDLDIRECT"  Physical Exam:    VS:  BP 112/70   Pulse 93   Ht 5\' 5"  (1.651 m)   Wt 170 lb (77.1 kg)   SpO2 97%   BMI 28.29 kg/m     Wt Readings from Last 3 Encounters:  06/06/23 170 lb (77.1 kg)  05/26/23 170 lb (77.1 kg)  05/19/23 170 lb 12.8 oz (77.5 kg)      GEN:  Well nourished, well developed in no acute distress HEENT: Normal NECK: No JVD; No carotid bruits LYMPHATICS: No lymphadenopathy CARDIAC: RRR, no murmurs, no rubs, no gallops RESPIRATORY:  Clear to auscultation without rales, wheezing or rhonchi  ABDOMEN: Soft, non-tender, non-distended MUSCULOSKELETAL:  No edema; No deformity  SKIN: Warm and dry LOWER EXTREMITIES: no swelling NEUROLOGIC:  Alert and oriented x 3 PSYCHIATRIC:  Normal affect   ASSESSMENT:    1. Paroxysmal atrial fibrillation (HCC)   2. Essential hypertension   3. Atypical atrial flutter (HCC)  4. S/P Maze operation for atrial fibrillation   5. S/P MVR (mitral valve repair)   6. S/P TVR (tricuspid valve repair)    PLAN:    In order of problems listed above:  Atrial flutter.  I we will refer her to our EP colleagues for consideration of atrial flutter ablation.  I will talk to her about potentially doing TEE and cardioversion however she preferred to talk about atrial flutter ablation which we will do.  In the meantime continue anticoagulation. Essential hypertension blood pressure well-controlled continue present management. Status post mitral valve repair and tricuspid valve repair.  Will schedule her to have echocardiogram. Ascending aortic aneurysm will do echocardiogram to look at the size of the aorta and then we will do CT.   Medication Adjustments/Labs and Tests Ordered: Current medicines are reviewed at length with the patient today.  Concerns regarding medicines are outlined above.  Orders Placed This Encounter  Procedures   EKG 12-Lead   Medication changes: No orders of the defined types were placed in this encounter.   Signed, Manfred Seed, MD, [facility] 06/06/2023 10:44 AM    [facility]

## 2023-06-06 NOTE — Patient Instructions (Signed)
 Medication Instructions:  Your physician recommends that you continue on your current medications as directed. Please refer to the Current Medication list given to you today.  *If you need a refill on your cardiac medications before your next appointment, please call your pharmacy*   Lab Work: None ordered If you have labs (blood work) drawn today and your tests are completely normal, you will receive your results only by: MyChart Message (if you have MyChart) OR A paper copy in the mail If you have any lab test that is abnormal or we need to change your treatment, we will call you to review the results.   Testing/Procedures: Your physician has requested that you have an echocardiogram. Echocardiography is a painless test that uses sound waves to create images of your heart. It provides your doctor with information about the size and shape of your heart and how well your heart's chambers and valves are working. This procedure takes approximately one hour. There are no restrictions for this procedure. Please do NOT wear cologne, perfume, aftershave, or lotions (deodorant is allowed). Please arrive 15 minutes prior to your appointment time.  Please note: We ask at that you not bring children with you during ultrasound (echo/ vascular) testing. Due to room size and safety concerns, children are not allowed in the ultrasound rooms during exams. Our front office staff cannot provide observation of children in our lobby area while testing is being conducted. An adult accompanying a patient to their appointment will only be allowed in the ultrasound room at the discretion of the ultrasound technician under special circumstances. We apologize for any inconvenience.    Follow-Up: At [facility], you and your health needs are our priority.  As part of our continuing mission to provide you with exceptional heart care, we have created designated Provider Care Teams.  These Care Teams include  your primary Cardiologist (physician) and Advanced Practice Providers (APPs -  Physician Assistants and Nurse Practitioners) who all work together to provide you with the care you need, when you need it.  We recommend signing up for the patient portal called "MyChart".  Sign up information is provided on this After Visit Summary.  MyChart is used to connect with patients for Virtual Visits (Telemedicine).  Patients are able to view lab/test results, encounter notes, upcoming appointments, etc.  Non-urgent messages can be sent to your provider as well.   To learn more about what you can do with MyChart, go to [email or web address].    Your next appointment:   3 month(s)  The format for your next appointment:   In Person  Provider:   Ralene Burger, MD    Other Instructions none  Important Information About Sugar

## 2023-06-06 NOTE — Addendum Note (Signed)
 Addended by: Einar Grave on: 06/06/2023 11:11 AM   Modules accepted: Orders

## 2023-06-06 NOTE — Addendum Note (Signed)
 Addended by: Einar Grave on: 06/06/2023 10:51 AM   Modules accepted: Orders

## 2023-06-20 ENCOUNTER — Ambulatory Visit: Attending: Cardiology | Admitting: Cardiology

## 2023-06-20 ENCOUNTER — Encounter: Payer: Self-pay | Admitting: Cardiology

## 2023-06-20 VITALS — BP 130/70 | HR 68 | Ht 65.5 in | Wt 171.0 lb

## 2023-06-20 DIAGNOSIS — D6869 Other thrombophilia: Secondary | ICD-10-CM

## 2023-06-20 DIAGNOSIS — I10 Essential (primary) hypertension: Secondary | ICD-10-CM | POA: Diagnosis not present

## 2023-06-20 DIAGNOSIS — I484 Atypical atrial flutter: Secondary | ICD-10-CM | POA: Diagnosis not present

## 2023-06-20 DIAGNOSIS — I480 Paroxysmal atrial fibrillation: Secondary | ICD-10-CM

## 2023-06-20 NOTE — Progress Notes (Signed)
  Electrophysiology Office Note:   Date:  06/20/2023  ID:  [name], DOB 03-29-1940, MRN [id number]  Primary Cardiologist: Sherol Dixie, MD Primary Heart Failure: None Electrophysiologist: None      History of Present Illness:   Marissa Ponce is a 83 y.o. female with h/o mitral valve repair, tricuspid valve repair, ascending aortic aneurysm, hypertension, hyperlipidemia, atrial flutter seen today for  for Electrophysiology evaluation of atrial flutter at the request of Ralene Burger.    She was seen by her primary cardiologist who found that she was in atrial flutter.  She had an a cardioversion, but unfortunately did not tolerate amiodarone .  Today, denies symptoms of palpitations, chest pain, shortness of breath, orthopnea, PND, lower extremity edema, claudication, dizziness, presyncope, syncope, bleeding, or neurologic sequela. The patient is tolerating medications without difficulties.  She is feeling well.  She has some intermittent episodes of fatigue, but continues to be able to do her daily activities.  She is minimally symptomatic from her atrial flutter.  She may wish for rhythm control but would like to think about it further.  Review of systems complete and found to be negative unless listed in HPI.   EP Information / Studies Reviewed:    EKG is not ordered today. EKG from 06/06/23 reviewed which showed atrial flutter        Risk Assessment/Calculations:    CHA2DS2-VASc Score = 5   This indicates a 7.2% annual risk of stroke. The patient's score is based upon: CHF History: 0 HTN History: 1 Diabetes History: 0 Stroke History: 0 Vascular Disease History: 1 Age Score: 2 Gender Score: 1             Physical Exam:   VS:  BP 130/70   Pulse 68   Ht 5' 5.5" (1.664 m)   Wt 171 lb (77.6 kg)   SpO2 99%   BMI 28.02 kg/m    Wt Readings from Last 3 Encounters:  06/20/23 171 lb (77.6 kg)  06/06/23 170 lb (77.1 kg)  05/26/23 170 lb (77.1 kg)      GEN: Well nourished, well developed in no acute distress NECK: No JVD; No carotid bruits CARDIAC: Irregularly irregular rate and rhythm, no murmurs, rubs, gallops RESPIRATORY:  Clear to auscultation without rales, wheezing or rhonchi  ABDOMEN: Soft, non-tender, non-distended EXTREMITIES:  No edema; No deformity   ASSESSMENT AND PLAN:    1.  Paroxysmal atrial fibrillation/flutter: Post surgical maze.  She is in atrial flutter today.  She may benefit from rhythm control.  For now, she would like to hold off.  She would like to think about options further.  She is feeling well and is without major complaint at this time.  She does have some intermittent fatigue, but is continuing to be able to do her daily activities.  2.  Atypical atrial flutter: Therapy as above  3.  Valvular heart disease: Post MVR/TVR: Stable on most recent echo.  Plan per primary cardiology.  4.  Hypertension: Well-controlled  5.  Secondary hypercoagulable state: Currently on Eliquis   Follow up with Dr. Lawana Pray  pending decision on ablation   Signed, Elliette Seabolt Cortland Ding, MD

## 2023-06-20 NOTE — Patient Instructions (Addendum)
 Medication Instructions:  Your physician recommends that you continue on your current medications as directed. Please refer to the Current Medication list given to you today.  *If you need a refill on your cardiac medications before your next appointment, please call your pharmacy*   Lab Work: None ordered If you have labs (blood work) drawn today and your tests are completely normal, you will receive your results only by: MyChart Message (if you have MyChart) OR A paper copy in the mail If you have any lab test that is abnormal or we need to change your treatment, we will call you to review the results.   Testing/Procedures: None ordered   Follow-Up: At [facility], you and your health needs are our priority.  As part of our continuing mission to provide you with exceptional heart care, we have created designated Provider Care Teams.  These Care Teams include your primary Cardiologist (physician) and Advanced Practice Providers (APPs -  Physician Assistants and Nurse Practitioners) who all work together to provide you with the care you need, when you need it.  We recommend signing up for the patient portal called "MyChart".  Sign up information is provided on this After Visit Summary.  MyChart is used to connect with patients for Virtual Visits (Telemedicine).  Patients are able to view lab/test results, encounter notes, upcoming appointments, etc.  Non-urgent messages can be sent to your provider as well.   To learn more about what you can do with MyChart, go to [email or web address].    Your next appointment:   To be  determined  The format for your next appointment:   In Person  Provider:   Agatha Horsfall, MD    Thank you for choosing [facility]!!   Reece Cane, RN [phone]  Other Instructions  Pulsed Field Ablation is the name of the ablation Dr. Lawana Pray performs.  Please call the office and let us  know your decision.    Cardiac Ablation Cardiac  ablation is a procedure to destroy (ablate) heart tissue that is sending bad signals. These bad signals cause the heart to beat very fast or in a way that is not normal. Destroying some tissues can help make the heart rhythm normal. Tell your doctor about: Any allergies you have. All medicines you are taking. These include vitamins, herbs, eye drops, creams, and over-the-counter medicines. Any problems you or family members have had with anesthesia. Any bleeding problems you have. Any surgeries you have had. Any medical conditions you have. Whether you are pregnant or may be pregnant. What are the risks? Your doctor will talk with you about risks. These may include: Infection. Bruising and bleeding. Stroke or blood clots. Damage to nearby areas of your body. Allergies to medicines or dyes. Needing a pacemaker if the heart gets damaged. A pacemaker helps the heart beat normally. The procedure not working. What happens before the procedure? Medicines Ask your doctor about changing or stopping: Your normal medicines. Vitamins, herbs, and supplements. Over-the-counter medicines. Do not take aspirin  or ibuprofen unless you are told to. General instructions Follow instructions from your doctor about what you may eat and drink. If you will be going home right after the procedure, plan to have a responsible adult: Take you home from the hospital or clinic. You will not be allowed to drive. Care for you for the time you are told. Ask your doctor what steps will be taken to prevent the spread of germs. What happens during the procedure?  An IV tube  will be put into one of your veins. You may be given: A sedative. This helps you relax. Anesthesia. This will: Numb certain areas of your body. The skin on your neck or groin will be numbed. A cut (incision) will be made in your neck or groin. A needle will be put through the cut and into a large vein. The small, thin tube (catheter) will be  put into the needle. The tube will be moved to your heart. A type of X-ray (fluoroscopy) will be used to help guide the tube. It will also show constant images of the heart on a screen. Dye may be put through the tube. This helps your doctor see your heart. An electric current will be sent from the tube to destroy heart tissue in certain areas. The tube will be taken out. Pressure will be held on your cut. This helps stop bleeding. A bandage (dressing) will be put over your cut. The procedure may vary among doctors and hospitals. What happens after the procedure? You will be monitored until you leave the hospital or clinic. This includes checking your blood pressure, heart rate and rhythm, breathing rate, and blood oxygen level. Your cut will be checked for bleeding. You will need to lie still for a few hours. If your groin was used, you will need to keep your leg straight for a few hours after the small, thin tube is removed. This information is not intended to replace advice given to you by your health care provider. Make sure you discuss any questions you have with your health care provider. Document Revised: 07/21/2021 Document Reviewed: 07/21/2021 Elsevier Patient Education  2024 [employer].

## 2023-06-24 ENCOUNTER — Ambulatory Visit: Admitting: Cardiology

## 2023-07-05 ENCOUNTER — Ambulatory Visit: Attending: Cardiology

## 2023-07-05 DIAGNOSIS — I480 Paroxysmal atrial fibrillation: Secondary | ICD-10-CM | POA: Diagnosis not present

## 2023-07-06 LAB — ECHOCARDIOGRAM COMPLETE
MV VTI: 0.67 cm2
S' Lateral: 3.4 cm

## 2023-07-07 ENCOUNTER — Ambulatory Visit: Payer: Self-pay | Admitting: Cardiology

## 2023-07-27 DIAGNOSIS — M25551 Pain in right hip: Secondary | ICD-10-CM | POA: Insufficient documentation

## 2023-07-27 DIAGNOSIS — M7061 Trochanteric bursitis, right hip: Secondary | ICD-10-CM | POA: Diagnosis not present

## 2023-08-16 ENCOUNTER — Other Ambulatory Visit (HOSPITAL_COMMUNITY): Payer: Self-pay

## 2023-08-16 ENCOUNTER — Telehealth: Payer: Self-pay | Admitting: Cardiology

## 2023-08-16 MED ORDER — TORSEMIDE 20 MG PO TABS
40.0000 mg | ORAL_TABLET | Freq: Every day | ORAL | 3 refills | Status: DC | PRN
  Filled 2023-08-16: qty 180, 90d supply, fill #0

## 2023-08-16 NOTE — Telephone Encounter (Signed)
*  STAT* If patient is at the pharmacy, call can be transferred to refill team.   1. Which medications need to be refilled? (please list name of each medication and dose if known)   torsemide  (DEMADEX ) 20 MG tablet    2. Which pharmacy/location (including street and city if local pharmacy) is medication to be sent to? [id number] [place], [place] - [address] Phone: [phone]  Fax: [phone]   3. Do they need a 30 day or 90 day supply? 30

## 2023-08-22 ENCOUNTER — Other Ambulatory Visit (HOSPITAL_COMMUNITY): Payer: Self-pay

## 2023-08-22 MED ORDER — TORSEMIDE 20 MG PO TABS: 40.0000 mg | ORAL_TABLET | Freq: Every day | ORAL | 2 refills | Status: AC | PRN

## 2023-08-22 NOTE — Addendum Note (Signed)
 Addended by: BLUFORD, Goerge Mohr L on: 08/22/2023 01:20 PM   Modules accepted: Orders

## 2023-08-22 NOTE — Telephone Encounter (Signed)
 RX sent to requested Pharmacy

## 2023-08-22 NOTE — Telephone Encounter (Signed)
*  STAT* If patient is at the pharmacy, call can be transferred to refill team.     1. Which medications need to be refilled? (please list name of each medication and dose if known)   torsemide  (DEMADEX ) 20 MG tablet      2. Which pharmacy/location (including street and city if local pharmacy) is medication to be sent to? [id number] [place], [place] - [address] Phone: [phone]  Fax: [phone]    3. Do they need a 30 day or 90 day supply? 30 day    Pt is out of medication and has been waiting on refill request since 7/1 when she first called.

## 2023-09-05 ENCOUNTER — Ambulatory Visit: Attending: Cardiology | Admitting: Cardiology

## 2023-09-05 ENCOUNTER — Encounter: Payer: Self-pay | Admitting: Cardiology

## 2023-09-05 VITALS — BP 124/68 | HR 49 | Ht 65.5 in | Wt 167.8 lb

## 2023-09-05 DIAGNOSIS — I484 Atypical atrial flutter: Secondary | ICD-10-CM | POA: Diagnosis not present

## 2023-09-05 DIAGNOSIS — I7121 Aneurysm of the ascending aorta, without rupture: Secondary | ICD-10-CM | POA: Diagnosis not present

## 2023-09-05 DIAGNOSIS — Z9889 Other specified postprocedural states: Secondary | ICD-10-CM

## 2023-09-05 DIAGNOSIS — I5032 Chronic diastolic (congestive) heart failure: Secondary | ICD-10-CM

## 2023-09-05 MED ORDER — APIXABAN 5 MG PO TABS: 5.0000 mg | ORAL_TABLET | Freq: Two times a day (BID) | ORAL | Status: DC

## 2023-09-05 NOTE — Addendum Note (Signed)
 Addended by: ARLOA PLANAS D on: 09/05/2023 02:56 PM   Modules accepted: Orders

## 2023-09-05 NOTE — Progress Notes (Signed)
 Cardiology Office Note:    Date:  09/05/2023   ID:  Marissa Ponce, DOB April 04, 1940, MRN [id number]  PCP:  Ina Marcellus RAMAN, MD  Cardiologist:  Lamar Fitch, MD    Referring MD: Ina Marcellus RAMAN, MD   Chief Complaint  Patient presents with   Follow-up    History of Present Illness:    Marissa Ponce is a 83 y.o. female past medical history significant for mitral valve repair as well as tricuspid valve repair, both were done at [facility] in 2019, at that time she did have a Maze procedure, ascending aortic aneurysm measuring 41 mm in 2019 on the CT recent reading measure by echocardiogram showing aneurysm measuring 42 mm, additional problem include essential hypertension, dyslipidemia.  Comes today 2 months for follow-up overall doing fine denies of any chest pain tightness squeezing pressure burning chest.  I did send her to our EP team for consideration of more advanced management of her arrhythmia however she preferred just simply watch the situation.  Denies have any chest pain tightness squeezing pressure burning chest no palpitations dizziness swelling of lower extremities overall very happy in the way she is  Past Medical History:  Diagnosis Date   AF (atrial fibrillation) (HCC) 03/06/2019   Arthritis    Ascending aortic aneurysm (HCC) 04/11/2017   4.1 cm on CT scan done 04/09/17. Needs annual follow-up.   Complication of anesthesia    Diastolic CHF (HCC) 05/03/2017   Dyslipidemia 04/08/2017   Dysrhythmia    with caffine, PVC's   Essential hypertension    Heparin induced thrombocytopenia (HCC) 03/11/2017   Hiatal hernia 03/06/2019   History of pulmonary embolism 04/08/2017   History of stroke 04/08/2017   Hypokalemia 04/12/2017   Migraines     history of none recent   Paroxysmal atrial fibrillation (HCC) 03/07/2017   Persistent atrial fibrillation (HCC) 03/31/2016   Pleural effusion 04/09/2017   PONV (postoperative nausea and vomiting)    Prediabetes 04/12/2017   Pulmonary  hypertension (HCC) 05/19/2016   Overview:  Risk factors include HFpEF, severe MR and atrial fibrillation.  PH Data: - TTE 04/13/16: LVEF 60-65%, LVH, LA moderately dilated. Mild aortic valve leaflet thickening with sclerosis. Moderate MR. Mild to moderate TR. Moderate PH with RVSP estimated 53 mmHg. IVC dilated.   S/P Maze operation for atrial fibrillation 02/22/2017   S/P MVR (mitral valve repair) 02/22/2017   Overview:  02/22/17: 28 mm ring was chosen based on the size of the anterior mitral leaflet. 28 mm Tri-Ad ring for tricuspid valve.   S/P total knee replacement using cement 05/16/2015   S/P TVR (tricuspid valve repair) 02/22/2017   Seizure (HCC) 02/24/2017   Shortness of breath 03/31/2016   Shortness of breath dyspnea    with exertion   TIA (transient ischemic attack) 02/24/2017   Visual blurriness 03/09/2017    Past Surgical History:  Procedure Laterality Date   ABDOMINAL HYSTERECTOMY     ANTERIOR AND POSTERIOR REPAIR     atrial ablation     AFIB ablationat [facility]   CARDIAC SURGERY     micuspid and tricuspid valve in 02/22/17   CARDIOVERSION     CHOLECYSTECTOMY     COLONOSCOPY     ESOPHAGEAL DILATION     HERNIA REPAIR Left    Ingunial   IR THORACENTESIS ASP PLEURAL SPACE W/IMG GUIDE  04/11/2017   ROTATOR CUFF REPAIR Right 2010ish   TOTAL KNEE ARTHROPLASTY Left 05/16/2015   Procedure: LEFT TOTAL KNEE ARTHROPLASTY;  Surgeon: Marcey  Kay, MD;  Location: [facility] OR;  Service: Orthopedics;  Laterality: Left;    Current Medications: Current Meds  Medication Sig   apixaban  (ELIQUIS ) 5 MG TABS tablet Take 1 tablet (5 mg total) by mouth 2 (two) times daily.   cholecalciferol (VITAMIN D) 1000 units tablet Take 1,000 Units by mouth daily.   diltiazem  (CARDIZEM  CD) 120 MG 24 hr capsule TAKE 1 CAPSULE BY MOUTH EVERY DAY   potassium chloride  (KLOR-CON  M) 10 MEQ tablet Take 10-20 mEq by mouth daily as needed (as directed).   spironolactone  (ALDACTONE ) 25 MG tablet TAKE 1/2 TABLET BY MOUTH EVERY DAY    torsemide  (DEMADEX ) 20 MG tablet Take 2 tablets (40 mg total) by mouth daily as needed (as directed).   traMADol  (ULTRAM ) 50 MG tablet Take 50 mg by mouth 3 (three) times daily as needed for moderate pain.     Allergies:   Enoxaparin , Heparin, Lovenox  [enoxaparin  sodium], Metoprolol, Percocet [oxycodone-acetaminophen ], Prednisone, Succinylsulphathiazole, Amiodarone , Lacosamide, and Sulfonamide derivatives   Social History   Socioeconomic History   Marital status: Married    Spouse name: Not on file   Number of children: Not on file   Years of education: Not on file   Highest education level: Not on file  Occupational History   Not on file  Tobacco Use   Smoking status: Former    Types: Cigarettes   Smokeless tobacco: Never   Tobacco comments:    quit age 40  Vaping Use   Vaping status: Never Used  Substance and Sexual Activity   Alcohol use: No   Drug use: No   Sexual activity: Not on file  Other Topics Concern   Not on file  Social History Narrative   Not on file   Social Drivers of Health   Financial Resource Strain: Not on file  Food Insecurity: Not on file  Transportation Needs: Not on file  Physical Activity: Not on file  Stress: Not on file  Social Connections: Not on file     Family History: The patient's family history includes Cancer in her father; Heart failure in her mother; Valvular heart disease in her mother. ROS:   Please see the history of present illness.    All 14 point review of systems negative except as described per history of present illness  EKGs/Labs/Other Studies Reviewed:         Recent Labs: 05/16/2023: B Natriuretic Peptide 200.7; BUN 19; Creatinine, Ser 1.02; Hemoglobin 13.1; Platelets 248; Potassium 3.8; Sodium 137 05/19/2023: ALT 11; TSH 4.180  Recent Lipid Panel No results found for: CHOL, TRIG, HDL, CHOLHDL, VLDL, LDLCALC, LDLDIRECT  Physical Exam:    VS:  BP 124/68 (BP Location: Left Arm, Patient Position:  Sitting)   Pulse (!) 49   Ht 5' 5.5 (1.664 m)   Wt 167 lb 12.8 oz (76.1 kg)   SpO2 96%   BMI 27.50 kg/m     Wt Readings from Last 3 Encounters:  09/05/23 167 lb 12.8 oz (76.1 kg)  06/20/23 171 lb (77.6 kg)  06/06/23 170 lb (77.1 kg)     GEN:  Well nourished, well developed in no acute distress HEENT: Normal NECK: No JVD; No carotid bruits LYMPHATICS: No lymphadenopathy CARDIAC: RRR, no murmurs, no rubs, no gallops RESPIRATORY:  Clear to auscultation without rales, wheezing or rhonchi  ABDOMEN: Soft, non-tender, non-distended MUSCULOSKELETAL:  No edema; No deformity  SKIN: Warm and dry LOWER EXTREMITIES: no swelling NEUROLOGIC:  Alert and oriented x 3 PSYCHIATRIC:  Normal  affect   ASSESSMENT:    1. Aneurysm of ascending aorta without rupture (HCC)   2. Chronic diastolic congestive heart failure (HCC)   3. Atypical atrial flutter (HCC)   4. S/P TVR (tricuspid valve repair)   5. S/P MVR (mitral valve repair)    PLAN:    In order of problems listed above:  Status post mitral valve and aortic valve repair, there is mild to moderate stenosis of the mitral valve but asymptomatic continue monitoring. Atrial flutter which appears to be chronic.  She does not want to do anything about it she does not want to have a cardioversion no antiarrhythmic therapy no ablation.  She still think about this so we will see HR again and discussed that.  In the meantime continue anticoagulation, EKG will be done today. Aneurysm of the ascending aorta echocardiogram showed 42 mm we will continue monitoring.   Medication Adjustments/Labs and Tests Ordered: Current medicines are reviewed at length with the patient today.  Concerns regarding medicines are outlined above.  No orders of the defined types were placed in this encounter.  Medication changes: No orders of the defined types were placed in this encounter.   Signed, Lamar DOROTHA Fitch, MD, [facility] 09/05/2023 2:29 PM    [facility]

## 2023-09-05 NOTE — Patient Instructions (Signed)

## 2023-09-05 NOTE — Addendum Note (Signed)
 Addended by: ARLOA PLANAS D on: 09/05/2023 02:38 PM   Modules accepted: Orders

## 2023-10-17 ENCOUNTER — Other Ambulatory Visit: Payer: Self-pay | Admitting: Cardiology

## 2023-10-26 DIAGNOSIS — M1711 Unilateral primary osteoarthritis, right knee: Secondary | ICD-10-CM | POA: Diagnosis not present

## 2023-11-10 ENCOUNTER — Other Ambulatory Visit: Payer: Self-pay | Admitting: Cardiology

## 2023-12-21 ENCOUNTER — Telehealth: Payer: Self-pay | Admitting: Cardiology

## 2023-12-21 MED ORDER — APIXABAN 5 MG PO TABS: 5.0000 mg | ORAL_TABLET | Freq: Two times a day (BID) | ORAL | 0 refills | Status: DC

## 2023-12-21 NOTE — Telephone Encounter (Signed)
 RX sent in

## 2023-12-21 NOTE — Telephone Encounter (Signed)
 Prescription refill request for Eliquis  received. Indication:A-Fib Last office visit:09/05/23 Scr:1.02 Care Everywhere 05/16/23 Age: 83 Weight: 76.1kg  Per Protocol Pt is good at this Dose

## 2023-12-21 NOTE — Telephone Encounter (Signed)
*  STAT* If patient is at the pharmacy, call can be transferred to refill team.   1. Which medications need to be refilled? (please list name of each medication and dose if known) apixaban  (ELIQUIS ) 5 MG TABS tablet    2. Would you like to learn more about the convenience, safety, & potential cost savings by using the [facility]? no     3. Are you open to using the [facility] (Type [facility]. no ).   4. Which pharmacy/location (including street and city if local pharmacy) is medication to be sent to? [id number] - [place], [place] - [address]    5. Do they need a 30 day or 90 day supply? 30

## 2024-01-16 ENCOUNTER — Telehealth: Payer: Self-pay | Admitting: Cardiology

## 2024-01-16 NOTE — Telephone Encounter (Signed)
 Pt c/o medication issue:  1. Name of Medication: apixaban  (ELIQUIS ) 5 MG TABS tablet   2. How are you currently taking this medication (dosage and times per day)? As written   3. Are you having a reaction (difficulty breathing--STAT)? No   4. What is your medication issue? Pt would like to know why she only receiving 56 tablets every 30 days instead of 60.

## 2024-01-17 ENCOUNTER — Other Ambulatory Visit: Payer: Self-pay

## 2024-02-08 ENCOUNTER — Telehealth: Payer: Self-pay | Admitting: Cardiology

## 2024-02-08 ENCOUNTER — Other Ambulatory Visit: Payer: Self-pay

## 2024-02-08 MED ORDER — APIXABAN 5 MG PO TABS: 5.0000 mg | ORAL_TABLET | Freq: Two times a day (BID) | ORAL | 0 refills | Status: AC

## 2024-02-08 NOTE — Telephone Encounter (Signed)
" °*  STAT* If patient is at the pharmacy, call can be transferred to refill team.   1. Which medications need to be refilled? (please list name of each medication and dose if known)   apixaban  (ELIQUIS ) 5 MG TABS tablet     2. Would you like to learn more about the convenience, safety, & potential cost savings by using the [facility]?     3. Are you open to using the [facility] (Type [facility]. .   4. Which pharmacy/location (including street and city if local pharmacy) is medication to be sent to? [facility] [phone] - [place],  - [address]    5. Do they need a 30 day or 90 day supply? 90  "

## 2024-02-13 ENCOUNTER — Telehealth: Payer: Self-pay | Admitting: Cardiology

## 2024-02-13 NOTE — Telephone Encounter (Signed)
 Pt wants to know if Mucinex is okay to take with her current medications.

## 2024-02-13 NOTE — Telephone Encounter (Signed)
 Spoke with pt and advised that Muscinex was ok to take with her medications per Dr. Krasowski

## 2024-02-23 NOTE — Telephone Encounter (Signed)
 Pt is requesting a callback wanting to know why she's still getting on 56 tablets instead of 60. She stated she can't seem to get an answer from anyone and still hadn't heard anything since she asked back in December. Please advise

## 2024-02-24 MED ORDER — APIXABAN 5 MG PO TABS: 5.0000 mg | ORAL_TABLET | Freq: Two times a day (BID) | ORAL | 1 refills | Status: AC

## 2024-02-24 NOTE — Telephone Encounter (Signed)
 Prescription refill request for Eliquis  received. Indication: Atrial Flutter Last office visit: 09/05/23 Scr: 1.02 05/16/23 Age: 84 Weight: 76.1 KG Pt has Passed Parameters Dose Good

## 2024-03-06 ENCOUNTER — Telehealth: Payer: Self-pay | Admitting: Cardiology

## 2024-03-06 NOTE — Telephone Encounter (Signed)
" °*  STAT* If patient is at the pharmacy, call can be transferred to refill team.   1. Which medications need to be refilled? (please list name of each medication and dose if known)   apixaban  (ELIQUIS ) 5 MG TABS tablet     2. Would you like to learn more about the convenience, safety, & potential cost savings by using the [facility]? No   3. Are you open to using the [facility] ([facility]. ). No    4. Which pharmacy/location (including street and city if local pharmacy) is medication to be sent to?[facility] [phone] - [place], [place] - [address]    5. Do they need a 30 day or 90 day supply? 90  "

## 2024-03-13 NOTE — Telephone Encounter (Signed)
 Refill was done 02/24/24

## 2024-04-25 ENCOUNTER — Ambulatory Visit: Admitting: Cardiology
